# Patient Record
Sex: Female | Born: 1965
Health system: Southern US, Community
[De-identification: ages and names within clinical notes are randomized; demographics above are authoritative.]

## PROBLEM LIST (undated history)

## (undated) DIAGNOSIS — E119 Type 2 diabetes mellitus without complications: Secondary | ICD-10-CM

## (undated) DIAGNOSIS — I10 Essential (primary) hypertension: Secondary | ICD-10-CM

## (undated) DIAGNOSIS — E7800 Pure hypercholesterolemia, unspecified: Secondary | ICD-10-CM

## (undated) HISTORY — PX: HERNIA REPAIR: SHX51

---

## 1997-10-29 ENCOUNTER — Encounter: Admission: RE | Admit: 1997-10-29 | Discharge: 1997-10-29 | Payer: Self-pay | Admitting: Family Medicine

## 1997-11-05 ENCOUNTER — Encounter: Admission: RE | Admit: 1997-11-05 | Discharge: 1997-11-05 | Payer: Self-pay | Admitting: Family Medicine

## 1997-11-12 ENCOUNTER — Encounter: Admission: RE | Admit: 1997-11-12 | Discharge: 1997-11-12 | Payer: Self-pay | Admitting: Family Medicine

## 1997-11-14 ENCOUNTER — Encounter: Admission: RE | Admit: 1997-11-14 | Discharge: 1997-11-14 | Payer: Self-pay | Admitting: Family Medicine

## 1997-11-21 ENCOUNTER — Encounter: Admission: RE | Admit: 1997-11-21 | Discharge: 1997-11-21 | Payer: Self-pay | Admitting: Family Medicine

## 1997-11-27 ENCOUNTER — Encounter: Admission: RE | Admit: 1997-11-27 | Discharge: 1997-11-27 | Payer: Self-pay | Admitting: Family Medicine

## 1997-12-05 ENCOUNTER — Encounter: Admission: RE | Admit: 1997-12-05 | Discharge: 1997-12-05 | Payer: Self-pay | Admitting: Family Medicine

## 1997-12-13 ENCOUNTER — Encounter: Admission: RE | Admit: 1997-12-13 | Discharge: 1997-12-13 | Payer: Self-pay | Admitting: Family Medicine

## 1997-12-21 ENCOUNTER — Encounter: Admission: RE | Admit: 1997-12-21 | Discharge: 1997-12-21 | Payer: Self-pay | Admitting: Family Medicine

## 1997-12-27 ENCOUNTER — Encounter: Admission: RE | Admit: 1997-12-27 | Discharge: 1997-12-27 | Payer: Self-pay | Admitting: Family Medicine

## 1998-01-03 ENCOUNTER — Encounter: Admission: RE | Admit: 1998-01-03 | Discharge: 1998-01-03 | Payer: Self-pay | Admitting: Family Medicine

## 1998-01-10 ENCOUNTER — Encounter: Admission: RE | Admit: 1998-01-10 | Discharge: 1998-01-10 | Payer: Self-pay | Admitting: Family Medicine

## 1998-01-14 ENCOUNTER — Encounter: Admission: RE | Admit: 1998-01-14 | Discharge: 1998-01-14 | Payer: Self-pay | Admitting: Family Medicine

## 1998-01-16 ENCOUNTER — Encounter: Admission: RE | Admit: 1998-01-16 | Discharge: 1998-01-16 | Payer: Self-pay | Admitting: Family Medicine

## 1998-01-18 ENCOUNTER — Encounter: Admission: RE | Admit: 1998-01-18 | Discharge: 1998-01-18 | Payer: Self-pay | Admitting: Family Medicine

## 1998-01-29 ENCOUNTER — Encounter: Admission: RE | Admit: 1998-01-29 | Discharge: 1998-01-29 | Payer: Self-pay | Admitting: Sports Medicine

## 1998-02-19 ENCOUNTER — Encounter: Admission: RE | Admit: 1998-02-19 | Discharge: 1998-02-19 | Payer: Self-pay | Admitting: Family Medicine

## 1998-10-11 ENCOUNTER — Emergency Department (HOSPITAL_COMMUNITY): Admission: EM | Admit: 1998-10-11 | Discharge: 1998-10-11 | Payer: Self-pay | Admitting: Emergency Medicine

## 1998-12-17 ENCOUNTER — Encounter: Admission: RE | Admit: 1998-12-17 | Discharge: 1998-12-17 | Payer: Self-pay | Admitting: Family Medicine

## 1999-01-30 ENCOUNTER — Emergency Department (HOSPITAL_COMMUNITY): Admission: EM | Admit: 1999-01-30 | Discharge: 1999-01-30 | Payer: Self-pay | Admitting: Emergency Medicine

## 1999-02-05 ENCOUNTER — Encounter: Admission: RE | Admit: 1999-02-05 | Discharge: 1999-02-05 | Payer: Self-pay | Admitting: Family Medicine

## 1999-02-19 ENCOUNTER — Emergency Department (HOSPITAL_COMMUNITY): Admission: EM | Admit: 1999-02-19 | Discharge: 1999-02-19 | Payer: Self-pay | Admitting: Emergency Medicine

## 1999-03-20 ENCOUNTER — Encounter: Admission: RE | Admit: 1999-03-20 | Discharge: 1999-03-20 | Payer: Self-pay | Admitting: Family Medicine

## 1999-04-28 ENCOUNTER — Encounter: Admission: RE | Admit: 1999-04-28 | Discharge: 1999-04-28 | Payer: Self-pay | Admitting: Family Medicine

## 1999-04-29 ENCOUNTER — Encounter: Admission: RE | Admit: 1999-04-29 | Discharge: 1999-07-28 | Payer: Self-pay

## 1999-05-26 ENCOUNTER — Encounter: Admission: RE | Admit: 1999-05-26 | Discharge: 1999-05-26 | Payer: Self-pay | Admitting: Family Medicine

## 1999-06-18 ENCOUNTER — Ambulatory Visit (HOSPITAL_COMMUNITY): Admission: RE | Admit: 1999-06-18 | Discharge: 1999-06-18 | Payer: Self-pay

## 1999-10-10 ENCOUNTER — Encounter: Admission: RE | Admit: 1999-10-10 | Discharge: 1999-10-10 | Payer: Self-pay | Admitting: Family Medicine

## 1999-10-22 ENCOUNTER — Encounter: Admission: RE | Admit: 1999-10-22 | Discharge: 1999-10-22 | Payer: Self-pay | Admitting: Family Medicine

## 2000-05-19 ENCOUNTER — Emergency Department (HOSPITAL_COMMUNITY): Admission: EM | Admit: 2000-05-19 | Discharge: 2000-05-20 | Payer: Self-pay | Admitting: Emergency Medicine

## 2000-05-19 ENCOUNTER — Encounter: Payer: Self-pay | Admitting: Emergency Medicine

## 2000-06-03 ENCOUNTER — Encounter: Admission: RE | Admit: 2000-06-03 | Discharge: 2000-06-03 | Payer: Self-pay | Admitting: Family Medicine

## 2000-07-15 ENCOUNTER — Encounter: Payer: Self-pay | Admitting: General Surgery

## 2000-07-16 ENCOUNTER — Observation Stay (HOSPITAL_COMMUNITY): Admission: RE | Admit: 2000-07-16 | Discharge: 2000-07-17 | Payer: Self-pay | Admitting: General Surgery

## 2000-11-21 ENCOUNTER — Encounter: Payer: Self-pay | Admitting: Internal Medicine

## 2000-11-21 ENCOUNTER — Emergency Department (HOSPITAL_COMMUNITY): Admission: EM | Admit: 2000-11-21 | Discharge: 2000-11-22 | Payer: Self-pay | Admitting: Emergency Medicine

## 2001-03-02 ENCOUNTER — Emergency Department (HOSPITAL_COMMUNITY): Admission: EM | Admit: 2001-03-02 | Discharge: 2001-03-02 | Payer: Self-pay | Admitting: *Deleted

## 2001-03-14 ENCOUNTER — Encounter: Admission: RE | Admit: 2001-03-14 | Discharge: 2001-03-14 | Payer: Self-pay | Admitting: Sports Medicine

## 2001-04-19 ENCOUNTER — Encounter: Admission: RE | Admit: 2001-04-19 | Discharge: 2001-04-19 | Payer: Self-pay | Admitting: Family Medicine

## 2002-07-17 ENCOUNTER — Encounter (INDEPENDENT_AMBULATORY_CARE_PROVIDER_SITE_OTHER): Payer: Self-pay | Admitting: *Deleted

## 2002-07-17 LAB — CONVERTED CEMR LAB

## 2002-08-02 ENCOUNTER — Encounter: Admission: RE | Admit: 2002-08-02 | Discharge: 2002-08-02 | Payer: Self-pay | Admitting: Family Medicine

## 2002-08-16 ENCOUNTER — Encounter: Payer: Self-pay | Admitting: Family Medicine

## 2002-08-16 ENCOUNTER — Ambulatory Visit (HOSPITAL_COMMUNITY): Admission: RE | Admit: 2002-08-16 | Discharge: 2002-08-16 | Payer: Self-pay | Admitting: *Deleted

## 2002-11-16 ENCOUNTER — Encounter: Admission: RE | Admit: 2002-11-16 | Discharge: 2002-11-16 | Payer: Self-pay | Admitting: Family Medicine

## 2003-03-09 ENCOUNTER — Encounter (INDEPENDENT_AMBULATORY_CARE_PROVIDER_SITE_OTHER): Payer: Self-pay | Admitting: *Deleted

## 2003-03-09 ENCOUNTER — Encounter: Admission: RE | Admit: 2003-03-09 | Discharge: 2003-03-09 | Payer: Self-pay | Admitting: Sports Medicine

## 2003-04-10 ENCOUNTER — Encounter: Admission: RE | Admit: 2003-04-10 | Discharge: 2003-04-10 | Payer: Self-pay | Admitting: Family Medicine

## 2003-11-25 ENCOUNTER — Emergency Department (HOSPITAL_COMMUNITY): Admission: EM | Admit: 2003-11-25 | Discharge: 2003-11-25 | Payer: Self-pay | Admitting: Family Medicine

## 2003-11-26 ENCOUNTER — Emergency Department (HOSPITAL_COMMUNITY): Admission: EM | Admit: 2003-11-26 | Discharge: 2003-11-26 | Payer: Self-pay | Admitting: Family Medicine

## 2003-12-09 ENCOUNTER — Emergency Department (HOSPITAL_COMMUNITY): Admission: EM | Admit: 2003-12-09 | Discharge: 2003-12-09 | Payer: Self-pay | Admitting: *Deleted

## 2006-07-15 DIAGNOSIS — E663 Overweight: Secondary | ICD-10-CM

## 2006-07-15 DIAGNOSIS — F1010 Alcohol abuse, uncomplicated: Secondary | ICD-10-CM

## 2006-07-15 DIAGNOSIS — I10 Essential (primary) hypertension: Secondary | ICD-10-CM | POA: Insufficient documentation

## 2006-07-16 ENCOUNTER — Encounter (INDEPENDENT_AMBULATORY_CARE_PROVIDER_SITE_OTHER): Payer: Self-pay | Admitting: *Deleted

## 2007-02-21 ENCOUNTER — Encounter: Payer: Self-pay | Admitting: Family Medicine

## 2007-02-21 ENCOUNTER — Ambulatory Visit: Payer: Self-pay | Admitting: Sports Medicine

## 2007-02-21 ENCOUNTER — Ambulatory Visit (HOSPITAL_COMMUNITY): Admission: RE | Admit: 2007-02-21 | Discharge: 2007-02-21 | Payer: Self-pay | Admitting: Family Medicine

## 2007-02-21 LAB — CONVERTED CEMR LAB
ALT: 15 units/L (ref 0–35)
AST: 17 units/L (ref 0–37)
Albumin: 4.5 g/dL (ref 3.5–5.2)
BUN: 11 mg/dL (ref 6–23)
CO2: 24 meq/L (ref 19–32)
Chloride: 104 meq/L (ref 96–112)
Creatinine, Ser: 0.73 mg/dL (ref 0.40–1.20)
Glucose, Bld: 106 mg/dL — ABNORMAL HIGH (ref 70–99)
LDL Cholesterol: 107 mg/dL — ABNORMAL HIGH (ref 0–99)
MCHC: 32.6 g/dL (ref 30.0–36.0)
MCV: 89.3 fL (ref 78.0–100.0)
RBC: 4.19 M/uL (ref 3.87–5.11)
TSH: 2.167 microintl units/mL (ref 0.350–5.50)
Total Protein: 8.1 g/dL (ref 6.0–8.3)
Triglycerides: 130 mg/dL (ref <150)
WBC: 5 10*3/uL (ref 4.0–10.5)

## 2007-02-23 ENCOUNTER — Encounter: Payer: Self-pay | Admitting: Family Medicine

## 2007-03-04 ENCOUNTER — Encounter: Payer: Self-pay | Admitting: Family Medicine

## 2007-03-04 ENCOUNTER — Ambulatory Visit: Payer: Self-pay | Admitting: Internal Medicine

## 2007-03-08 ENCOUNTER — Ambulatory Visit (HOSPITAL_COMMUNITY): Admission: RE | Admit: 2007-03-08 | Discharge: 2007-03-08 | Payer: Self-pay | Admitting: Sports Medicine

## 2007-03-08 ENCOUNTER — Ambulatory Visit (HOSPITAL_COMMUNITY): Admission: RE | Admit: 2007-03-08 | Discharge: 2007-03-08 | Payer: Self-pay | Admitting: *Deleted

## 2007-03-08 ENCOUNTER — Encounter: Payer: Self-pay | Admitting: Sports Medicine

## 2007-03-11 ENCOUNTER — Encounter: Payer: Self-pay | Admitting: Family Medicine

## 2007-03-11 ENCOUNTER — Ambulatory Visit: Payer: Self-pay | Admitting: Family Medicine

## 2007-03-11 LAB — CONVERTED CEMR LAB
BUN: 18 mg/dL (ref 6–23)
CO2: 20 meq/L (ref 19–32)
Calcium: 9.3 mg/dL (ref 8.4–10.5)
Chloride: 98 meq/L (ref 96–112)

## 2007-03-15 ENCOUNTER — Ambulatory Visit: Payer: Self-pay | Admitting: Family Medicine

## 2008-03-19 LAB — CONVERTED CEMR LAB
Cholesterol: 191 mg/dL
Glucose, Bld: 141 mg/dL
HDL: 61 mg/dL
Triglycerides: 82 mg/dL

## 2008-03-23 ENCOUNTER — Ambulatory Visit (HOSPITAL_COMMUNITY): Admission: RE | Admit: 2008-03-23 | Discharge: 2008-03-23 | Payer: Self-pay | Admitting: *Deleted

## 2008-03-26 ENCOUNTER — Telehealth: Payer: Self-pay | Admitting: Family Medicine

## 2008-03-29 ENCOUNTER — Encounter: Payer: Self-pay | Admitting: *Deleted

## 2008-04-18 ENCOUNTER — Ambulatory Visit: Payer: Self-pay | Admitting: Family Medicine

## 2008-04-18 ENCOUNTER — Encounter: Payer: Self-pay | Admitting: Family Medicine

## 2008-04-18 LAB — CONVERTED CEMR LAB
BUN: 13 mg/dL (ref 6–23)
CO2: 25 meq/L (ref 19–32)
Potassium: 4.7 meq/L (ref 3.5–5.3)

## 2008-05-14 ENCOUNTER — Ambulatory Visit: Payer: Self-pay | Admitting: Family Medicine

## 2008-05-14 ENCOUNTER — Encounter: Payer: Self-pay | Admitting: Family Medicine

## 2008-05-14 DIAGNOSIS — E119 Type 2 diabetes mellitus without complications: Secondary | ICD-10-CM | POA: Insufficient documentation

## 2008-05-14 DIAGNOSIS — E1165 Type 2 diabetes mellitus with hyperglycemia: Secondary | ICD-10-CM

## 2008-05-14 LAB — CONVERTED CEMR LAB
Direct LDL: 131 mg/dL — ABNORMAL HIGH
Hgb A1c MFr Bld: 8 %
TSH: 2.06 microintl units/mL (ref 0.350–4.50)

## 2008-05-15 ENCOUNTER — Encounter: Payer: Self-pay | Admitting: Family Medicine

## 2008-05-16 ENCOUNTER — Encounter: Payer: Self-pay | Admitting: Family Medicine

## 2008-05-23 ENCOUNTER — Ambulatory Visit: Payer: Self-pay | Admitting: Cardiovascular Disease

## 2008-05-23 ENCOUNTER — Encounter: Payer: Self-pay | Admitting: Family Medicine

## 2008-05-23 ENCOUNTER — Ambulatory Visit (HOSPITAL_COMMUNITY): Admission: RE | Admit: 2008-05-23 | Discharge: 2008-05-23 | Payer: Self-pay | Admitting: Family Medicine

## 2008-05-29 ENCOUNTER — Telehealth (INDEPENDENT_AMBULATORY_CARE_PROVIDER_SITE_OTHER): Payer: Self-pay | Admitting: *Deleted

## 2008-07-04 ENCOUNTER — Telehealth: Payer: Self-pay | Admitting: Family Medicine

## 2008-07-23 ENCOUNTER — Encounter: Payer: Self-pay | Admitting: Family Medicine

## 2008-07-23 ENCOUNTER — Ambulatory Visit: Payer: Self-pay | Admitting: Family Medicine

## 2008-07-23 DIAGNOSIS — E785 Hyperlipidemia, unspecified: Secondary | ICD-10-CM | POA: Insufficient documentation

## 2008-07-23 LAB — CONVERTED CEMR LAB
BUN: 14 mg/dL (ref 6–23)
CO2: 20 meq/L (ref 19–32)
Calcium: 9.8 mg/dL (ref 8.4–10.5)
Potassium: 4.6 meq/L (ref 3.5–5.3)
Sodium: 137 meq/L (ref 135–145)

## 2008-07-23 IMAGING — MG MM DIGITAL SCREENING BILAT
3 series · 3 of 3 positions shown · non-contrast
Comparison: Prior studies.

DG SCREEN MAMMOGRAM BILATERAL
Bilateral CC and MLO view(s) were taken.
Prior study comparison: August 16, 2002, bilateral screening mammogram.

DIGITAL SCREENING MAMMOGRAM WITH CAD:

[R CC]
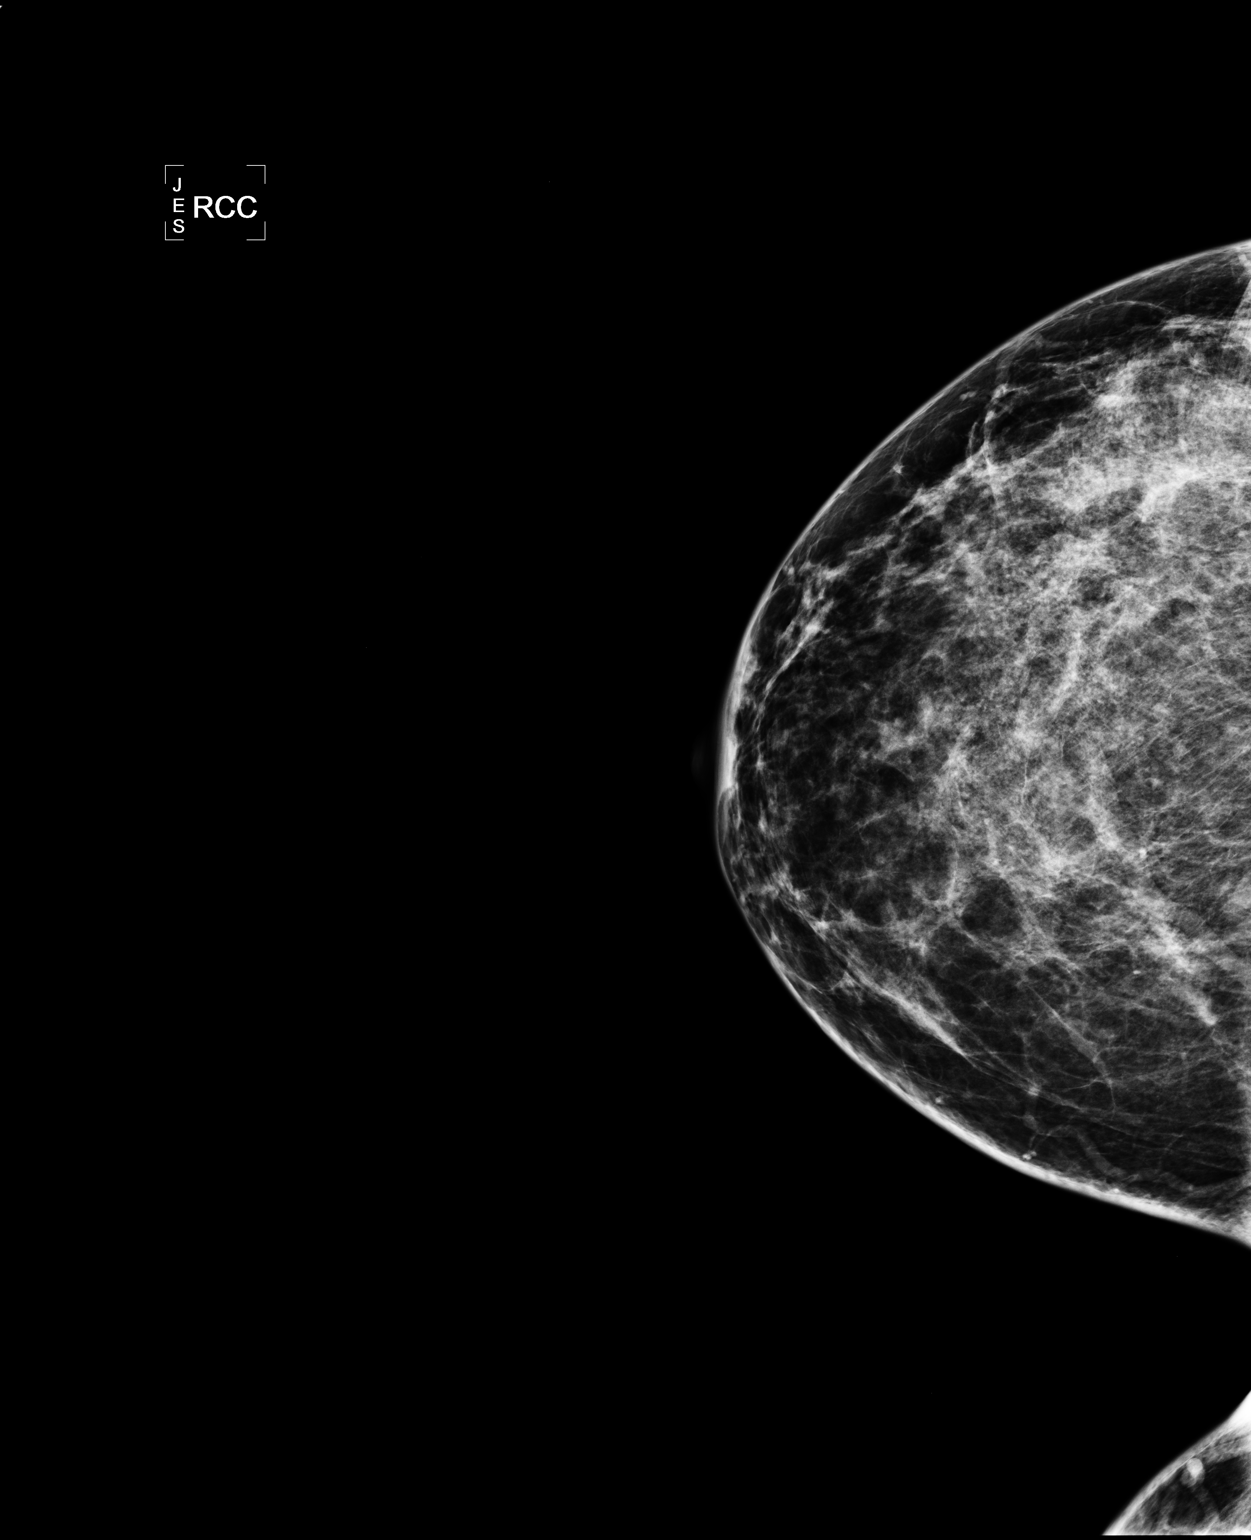

[R MLO]
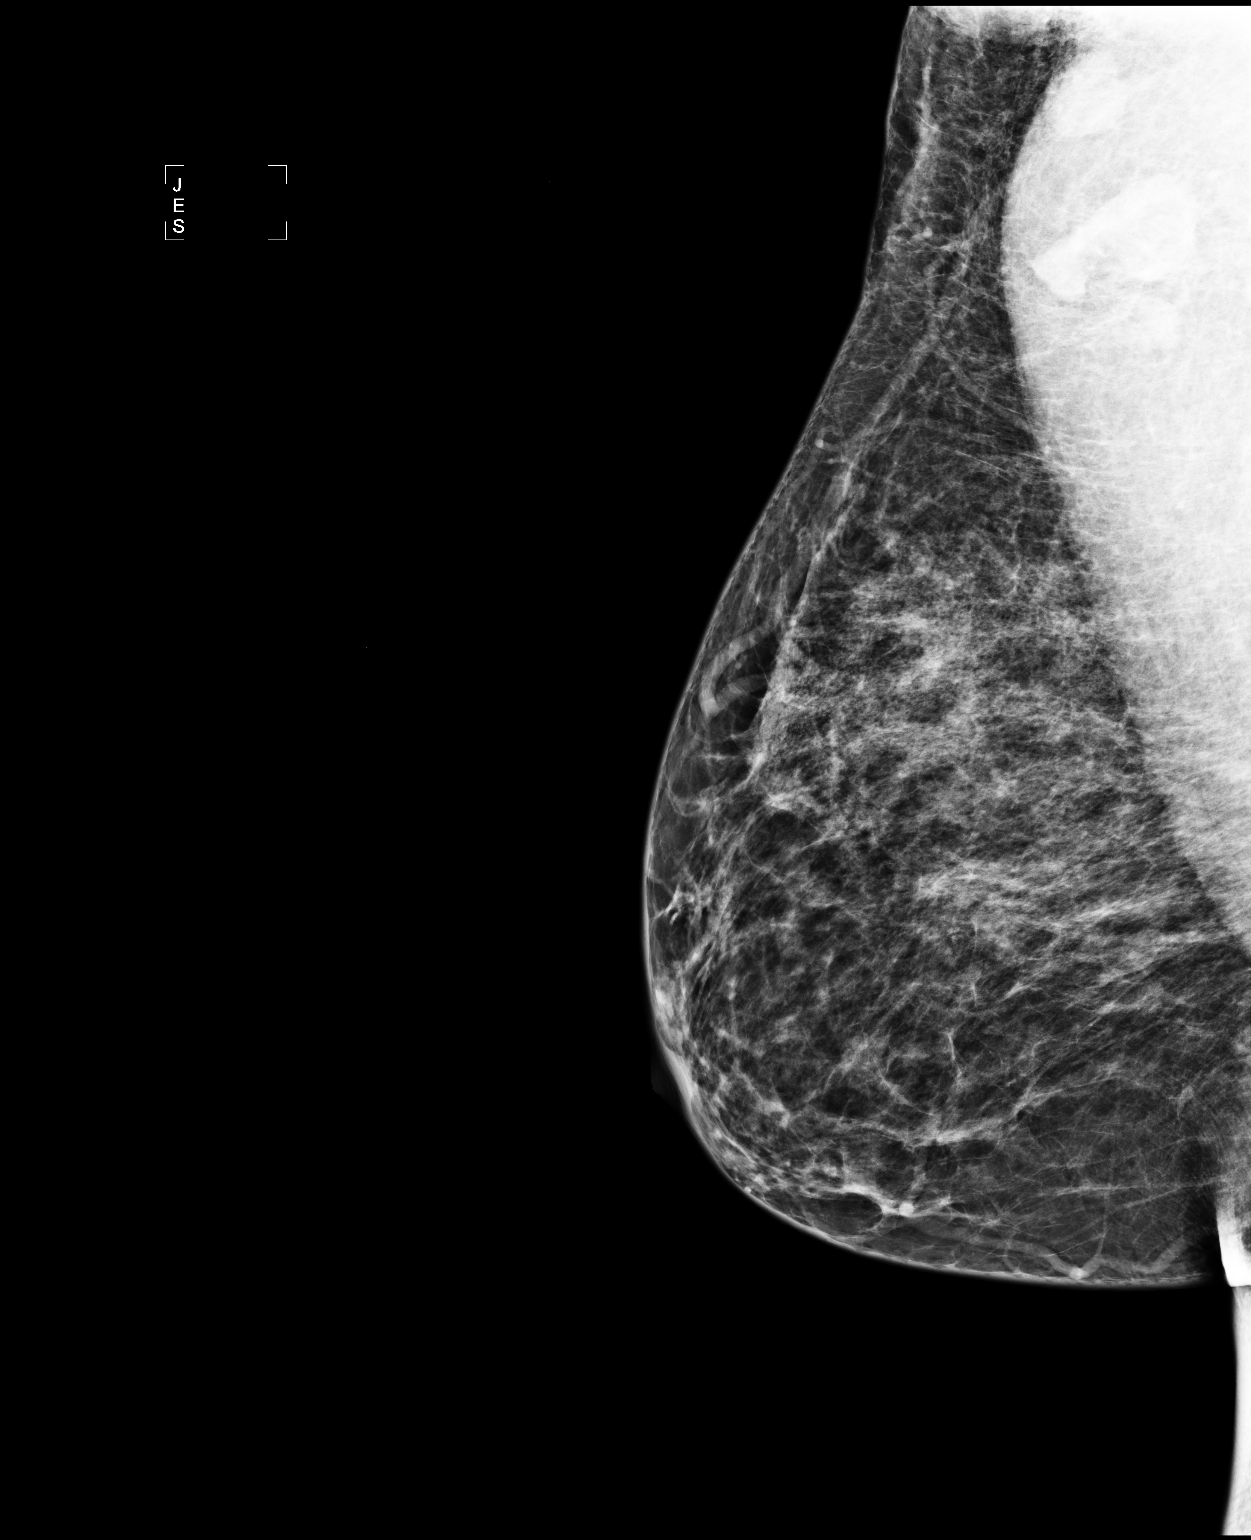

[L MLO]
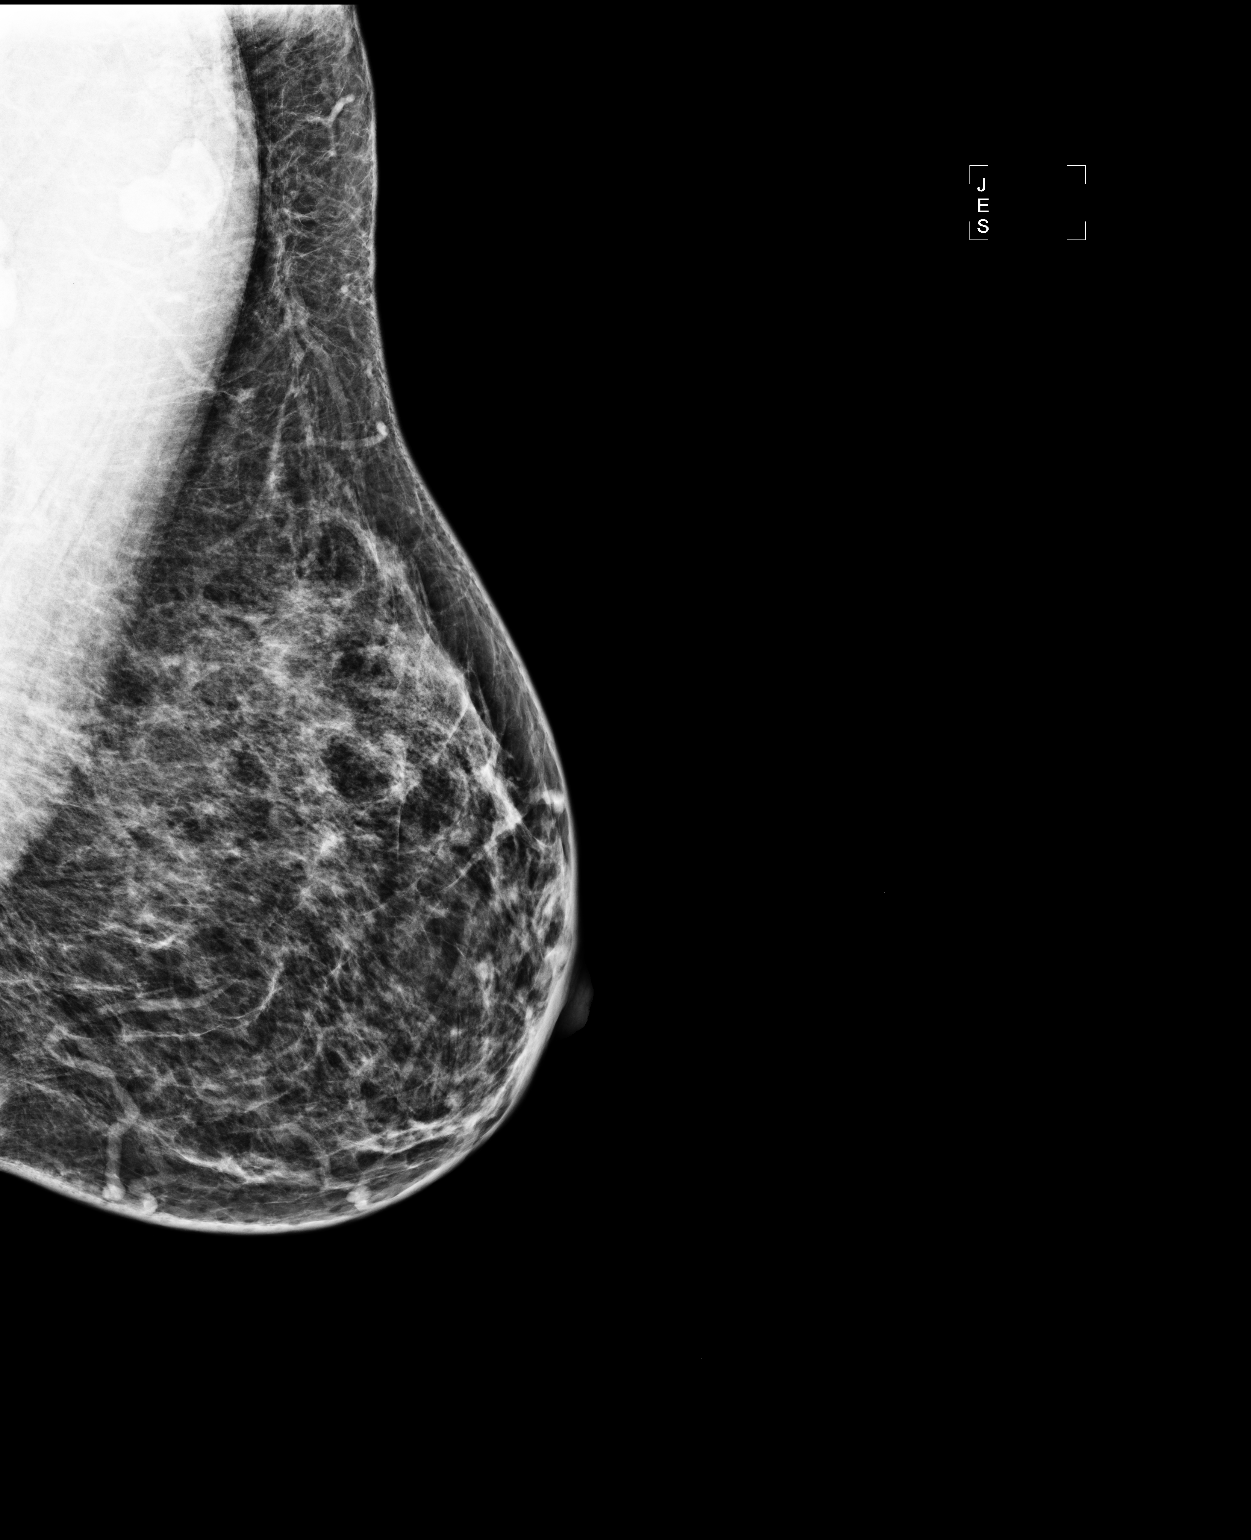

[3 of 3 positions shown; findings below may reference images not displayed]

The breast tissue is heterogeneously dense.  There is no dominant mass, architectural distortion or
calcification to suggest malignancy.
IMPRESSION: No mammographic evidence of malignancy.  Suggest yearly screening mammography.

ASSESSMENT: Negative - BI-RADS 1

Screening mammogram in 1 year.
ANALYZED BY COMPUTER AIDED DETECTION. , THIS PROCEDURE WAS A DIGITAL MAMMOGRAM.

## 2008-08-27 ENCOUNTER — Ambulatory Visit: Payer: Self-pay | Admitting: Family Medicine

## 2008-08-27 LAB — CONVERTED CEMR LAB: Hgb A1c MFr Bld: 8 %

## 2008-10-09 ENCOUNTER — Encounter: Payer: Self-pay | Admitting: Family Medicine

## 2008-10-09 ENCOUNTER — Ambulatory Visit: Payer: Self-pay | Admitting: Family Medicine

## 2008-10-12 LAB — CONVERTED CEMR LAB
Calcium: 10.1 mg/dL (ref 8.4–10.5)
Sodium: 140 meq/L (ref 135–145)

## 2009-01-17 ENCOUNTER — Ambulatory Visit: Payer: Self-pay | Admitting: Family Medicine

## 2009-02-23 ENCOUNTER — Encounter (INDEPENDENT_AMBULATORY_CARE_PROVIDER_SITE_OTHER): Payer: Self-pay | Admitting: *Deleted

## 2009-02-23 DIAGNOSIS — F17200 Nicotine dependence, unspecified, uncomplicated: Secondary | ICD-10-CM | POA: Insufficient documentation

## 2009-03-25 ENCOUNTER — Ambulatory Visit (HOSPITAL_COMMUNITY): Admission: RE | Admit: 2009-03-25 | Discharge: 2009-03-25 | Payer: Self-pay | Admitting: *Deleted

## 2009-09-19 ENCOUNTER — Encounter: Payer: Self-pay | Admitting: Family Medicine

## 2009-09-19 ENCOUNTER — Ambulatory Visit: Payer: Self-pay | Admitting: Family Medicine

## 2009-09-19 LAB — CONVERTED CEMR LAB
Chlamydia, DNA Probe: NEGATIVE
GC Probe Amp, Genital: NEGATIVE
Hgb A1c MFr Bld: 6.9 %

## 2009-09-23 ENCOUNTER — Encounter: Payer: Self-pay | Admitting: Family Medicine

## 2009-09-23 LAB — CONVERTED CEMR LAB: Pap Smear: NEGATIVE

## 2009-10-24 ENCOUNTER — Encounter: Payer: Self-pay | Admitting: Family Medicine

## 2009-10-25 ENCOUNTER — Telehealth: Payer: Self-pay | Admitting: Family Medicine

## 2009-10-30 ENCOUNTER — Ambulatory Visit: Payer: Self-pay | Admitting: Family Medicine

## 2009-10-30 DIAGNOSIS — A599 Trichomoniasis, unspecified: Secondary | ICD-10-CM | POA: Insufficient documentation

## 2009-11-01 ENCOUNTER — Telehealth (INDEPENDENT_AMBULATORY_CARE_PROVIDER_SITE_OTHER): Payer: Self-pay | Admitting: *Deleted

## 2010-02-21 ENCOUNTER — Encounter: Payer: Self-pay | Admitting: Family Medicine

## 2010-04-21 ENCOUNTER — Ambulatory Visit: Payer: Self-pay | Admitting: Family Medicine

## 2010-04-21 DIAGNOSIS — M79609 Pain in unspecified limb: Secondary | ICD-10-CM

## 2010-04-21 LAB — CONVERTED CEMR LAB: Hgb A1c MFr Bld: 7.8 %

## 2010-04-24 ENCOUNTER — Ambulatory Visit (HOSPITAL_COMMUNITY)
Admission: RE | Admit: 2010-04-24 | Discharge: 2010-04-24 | Payer: Self-pay | Source: Home / Self Care | Attending: Family Medicine | Admitting: Family Medicine

## 2010-04-30 ENCOUNTER — Ambulatory Visit (HOSPITAL_COMMUNITY)
Admission: RE | Admit: 2010-04-30 | Discharge: 2010-04-30 | Payer: Self-pay | Source: Home / Self Care | Attending: Family Medicine | Admitting: Family Medicine

## 2010-06-17 NOTE — Progress Notes (Signed)
  Phone Note Outgoing Call   Call placed by: Lamar Laundry, MD June 10th, 2011 Call placed to: Patient Summary of Call: Unfortuantely patient's listed home phone no longer in service.  Letter was returned as not forwardable.  She does work 3rd shift at [employer] on the [employer].  I called her work number.  She does not ever actually come into the office as she is 3rd shift but the woman I spoke with said she would give a note to her manager asking Sally Mclaughlin to call us at her earliest convenience.  When she calls in, please forward to RN.  Patient had trichomonas on her pap (she has had this before) and needs to be treated.  Her pap smear was normal otherwise, by the way.   Initial call taken by: Lamar Laundry, MD June 10th, 2011

## 2010-06-17 NOTE — Letter (Signed)
Summary: Generic Letter  [facility]     [place], [place]    Phone:   Fax:     02/21/2010  Sally Mclaughlin [address] [place], [place]  [address]  Dear Sally Mclaughlin,   I am writing to inform you that it is time for you to come in to have your blood pressure rechecked as well as have some labs drawn. Some of these labs will require that you are FASTING (nothing to eat or drink before coming in to have the blood drawn). Please call the office and schedule an appointment with me, Dr. Fara Boros, your new primary doctor. You can either come in fasting and we can draw the labs at that time, or you can plan to come back after our appointment for a lab-only visit.  I look forward to meeting you!      Sincerely,   Demetria Pore MD  Appended Document: Generic Letter mailed.

## 2010-06-17 NOTE — Progress Notes (Signed)
Summary: phn msg  Phone Note Call from Patient Call back at [phone]   Caller: Patient Summary of Call: pt is wanting to talk to nurse  Initial call taken by: De Nurse,  November 01, 2009 9:03 AM  Follow-up for Phone Call        patient is concerned because she has told husband that he needs to be checked for trich and he is refusing . advised patien tthat is is important that he be treated . Follow-up by: Theresia Lo RN,  November 01, 2009 10:04 AM     Appended Document: phn msg Her husband is my patient too.  Now that I know she has told him, I will try to call him and encourage treatment.  He does have appt 6/28.  His name is Sally Mclaughlin.

## 2010-06-17 NOTE — Letter (Signed)
Summary: Generic Letter  [facility]  [address]   [place], [place] [address]   Phone: [phone]  Fax: [phone]    09/23/2009  Sally Mclaughlin [address] [place], [place]  [address]  Dear Ms. Reali,  I was unable to reach you by phone.  I am happy to inform you that your pap smear was normal.  Your gonorrhea and chlamydia tests were negative.  However, you do have trichomonas.  This is a sexually transmitted disease and you have had it in the past.  It is easily treated by taking 1 large dose of a medicine called flagyl, or metronidazole, one time.  I have included the prescription with this letter.  Please fill it and take it today.  Any sexual partners should be informed and they should contact their doctor to be treated as well.  The medicine interacts with alcohol, so please abstain from alcohol the day that you take the medicine.  IF you have any questions, please call our office.     Sincerely,   Ardeen Garland  MD  Appended Document: Generic Letter Letter mailed along with script to pt.  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 I  have called them and asked them to leave a note for her asking her to give [place] a call.  See phone note for more details.

## 2010-06-17 NOTE — Miscellaneous (Signed)
Summary: PATIENT SUMMARY  Clinical Lists Changes  Problems: Removed problem of SCREENING FOR MALIGNANT NEOPLASM OF THE CERVIX (ICD-V76.2) Removed problem of AFTERCARE, LONG-TERM USE, MEDICATIONS NEC (ICD-V58.69) Assessed DIABETES MELLITUS, TYPE II, UNCONTROLLED as comment only - Patient often not taking prescriptions as prescribed, takes infrequently, or runs out for long periods of time.  Difficulty following diet/increasing exercise. Has difficulty affording medicines so try to stick to discount list.  Hopeful will not end up needing insulin as likely would not do well with it.  Her updated medication list for this problem includes:    Lisinopril-hydrochlorothiazide 20-12.5 Mg Tabs (Lisinopril-hydrochlorothiazide) .Marland Kitchen... 2 tabs by mouth once a day after you wake up/before you go to work for your blood pressure.    Metformin Hcl 1000 Mg Tabs (Metformin hcl) .Marland Kitchen... 1 tab twice a day for diabetes  Assessed HYPERTENSION, BENIGN SYSTEMIC as comment only - difficult to adjust meds as often has not taken them when she arrives for appointments.  Her updated medication list for this problem includes:    Metoprolol Tartrate 50 Mg Tabs (Metoprolol tartrate) .Marland Kitchen... 1 tab by mouth two times a day for blood pressure.  take it before you go to work and before you go to bed.    Lisinopril-hydrochlorothiazide 20-12.5 Mg Tabs (Lisinopril-hydrochlorothiazide) .Marland Kitchen... 2 tabs by mouth once a day after you wake up/before you go to work for your blood pressure.  Medications: Removed medication of METRONIDAZOLE 500 MG TABS (METRONIDAZOLE) 4 tabs by mouth x 1 today Observations: Added new observation of SOCIAL HX: Lives in [place] with 2 children and grandchild, smokes 1 cigs/d, drinks 4-12 beers/d quit 05/19/99,married 5/09 Dustin Folks (pt here) but separated as of spring 2011, sexually active, BTL/no condoms, works at [employer] on the night shift.  High salt diet.  Has difficulty understanding diabetic diet, how to  take meds.  (10/24/2009 15:38)       Social History: Lives in [place] with 2 children and grandchild, smokes 1 cigs/d, drinks 4-12 beers/d quit 05/19/99,married 5/09 Dustin Folks (pt here) but separated as of spring 2011, sexually active, BTL/no condoms, works at [employer] on the night shift.  High salt diet.  Has difficulty understanding diabetic diet, how to take meds.    Impression & Recommendations:  Problem # 1:  DIABETES MELLITUS, TYPE II, UNCONTROLLED (ICD-250.02) Patient often not taking prescriptions as prescribed, takes infrequently, or runs out for long periods of time.  Difficulty following diet/increasing exercise. Has difficulty affording medicines so try to stick to discount list.  Hopeful will not end up needing insulin as likely would not do well with it.  Her updated medication list for this problem includes:    Lisinopril-hydrochlorothiazide 20-12.5 Mg Tabs (Lisinopril-hydrochlorothiazide) .Marland Kitchen... 2 tabs by mouth once a day after you wake up/before you go to work for your blood pressure.    Metformin Hcl 1000 Mg Tabs (Metformin hcl) .Marland Kitchen... 1 tab twice a day for diabetes  Problem # 2:  HYPERTENSION, BENIGN SYSTEMIC (ICD-401.1) difficult to adjust meds as often has not taken them when she arrives for appointments.  Her updated medication list for this problem includes:    Metoprolol Tartrate 50 Mg Tabs (Metoprolol tartrate) .Marland Kitchen... 1 tab by mouth two times a day for blood pressure.  take it before you go to work and before you go to bed.    Lisinopril-hydrochlorothiazide 20-12.5 Mg Tabs (Lisinopril-hydrochlorothiazide) .Marland Kitchen... 2 tabs by mouth once a day after you wake up/before you go to work for your blood pressure.  Problem # 3:  Preventive Health Care (ICD-V70.0) Last mammo 2009.  Has been referred for/instructed to schedule one since but has not.  Last pap in 2011.  Normal x for trichomonas.   Complete Medication List: 1)  Metoprolol Tartrate 50 Mg Tabs (Metoprolol  tartrate) .Marland Kitchen.. 1 tab by mouth two times a day for blood pressure.  take it before you go to work and before you go to bed. 2)  Lisinopril-hydrochlorothiazide 20-12.5 Mg Tabs (Lisinopril-hydrochlorothiazide) .... 2 tabs by mouth once a day after you wake up/before you go to work for your blood pressure. 3)  Metformin Hcl 1000 Mg Tabs (Metformin hcl) .Marland Kitchen.. 1 tab twice a day for diabetes 4)  Pravastatin Sodium 40 Mg Tabs (Pravastatin sodium) .Marland Kitchen.. 1 tab by mouth everyday before you go to sleep for your cholesterol.

## 2010-06-17 NOTE — Assessment & Plan Note (Signed)
Summary: f/up,tcb   Vital Signs:  Patient profile:   45 year old female Height:      66 inches Weight:      197.44 pounds BMI:     31.98 BSA:     1.99 Temp:     98.7 degrees F Pulse rate:   66 / minute BP sitting:   151 / 87  Vitals Entered By: Jone Baseman CMA (April 21, 2010 8:36 AM) CC: f/u, Hypertension Management Is Patient Diabetic? Yes Did you bring your meter with you today? No Pain Assessment Patient in pain? no        Primary Provider:  Demetria Pore MD  CC:  f/u and Hypertension Management.  History of Present Illness: Pt p/w cheif complaint of upper leg pain.  Has been happening for "a long time," "years." Some times her baby aspirin helps, other times she needs to take Tylenol/Advil. She has a group of varicose veins that is where the pain is.  Her upper thigh is achy, knee and hip feel ok.  No sharp pains, but sometimes her leg gets "tired" and causes her to fall.  Her last fall was  ~2 weeks ago.  She has never gotten hurt when falling b/c she "braces" herself.  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   Pt also needs her metoprolol refilled-- she has been out of it since last night.  Still has lisinopril/HCTZ and is taking ASA 81mg .  Does not check her BP at home.  No CP, HAs, SOB, dizziness. Complains of some BLE edema after work; goes away w/ putting her feet up and resting.  Does not check sugars at home; doesn't know how and doesn't want to.  Takes her metformin every day, but knows she is not eating the way she should. A1c today = 7.8; was 6.9 09/19/09  Hypertension History:      She denies headache, chest pain, palpitations, dyspnea with exertion, visual symptoms, neurologic problems, and side effects from treatment.  She notes no problems with any antihypertensive medication side effects.        Positive major cardiovascular risk factors include diabetes, hyperlipidemia, hypertension, and current tobacco  user.  Negative major cardiovascular risk factors include female age less than 61 years old.      Habits & Providers  Alcohol-Tobacco-Diet     Tobacco Status: current     Tobacco Counseling: to quit use of tobacco products     Cigarette Packs/Day: <0.25  Current Problems (verified): 1)  Trichomonal Infection  (ICD-131.9) 2)  Diabetes Mellitus, Type II, Uncontrolled  (ICD-250.02) 3)  Hypertension, Benign Systemic  (ICD-401.1) 4)  Hyperlipidemia  (ICD-272.4) 5)  Obesity, Nos  (ICD-278.00) 6)  Tobacco User  (ICD-305.1) 7)  Alcohol Abuse, Unspecified  (ICD-305.00)  Current Medications (verified): 1)  Metoprolol Tartrate 50 Mg  Tabs (Metoprolol Tartrate) .Marland Kitchen.. 1 Tab By Mouth Two Times A Day For Blood Pressure.  Take It Before You Go To Work and Before You Go To Bed. 2)  Lisinopril-Hydrochlorothiazide 20-12.5 Mg Tabs (Lisinopril-Hydrochlorothiazide) .... 2 Tabs By Mouth Once A Day After You Wake Up/before You Go To Work For Your Blood Pressure. 3)  Metformin Hcl 1000 Mg Tabs (Metformin Hcl) .Marland Kitchen.. 1 Tab Twice A Day For Diabetes 4)  Pravastatin Sodium 40 Mg Tabs (Pravastatin Sodium) .Marland Kitchen.. 1 Tab By Mouth Everyday Before You Go To Sleep For Your Cholesterol. 5)  Metronidazole 500 Mg Tabs (Metronidazole) .Marland KitchenMarland KitchenMarland Kitchen  Take Four Tabs By Mouth X 1  Allergies (verified): No Known Drug Allergies  Social History: Lives in [place] with 2 children and grandchild, smokes 1-2 cigs/d, drinks 4-12 beers/d quit 05/19/99,married 5/09 Dustin Folks (pt here) but separated as of spring 2011, sexually active, BTL/no condoms, works at [employer] on the night shift.  High salt diet.  Has difficulty understanding diabetic diet, how to take meds.   Physical Exam  General:  alert, well-developed, well-nourished, and well-hydrated.   Head:  normocephalic and atraumatic.   Eyes:  vision grossly intact, pupils equal, pupils round, pupils reactive to light, pupils react to accomodation, and no optic disk abnormalities.     Mouth:  pharynx pink and moist, no erythema, and fair dentition.   Lungs:  normal respiratory effort, normal breath sounds, no dullness, no crackles, and no wheezes.   Heart:  normal rate, regular rhythm, and no murmur.   Abdomen:  soft, non-tender, and normal bowel sounds.   Pulses:  2+ pedal pulses Extremities:  mild edema BLE, no sores/lesions on feet bilaterally, nml sensation Group of varicose/spider veins over outter upper right leg; no TTP at hip or knee joints, nml ROM, no TTP over femur or area of pt reported discomfort Neurologic:  alert & oriented X3.    Diabetes Management Exam:    Foot Exam (with socks and/or shoes not present):       Sensory-Pinprick/Light touch:          Left medial foot (L-4): normal          Left dorsal foot (L-5): normal          Left lateral foot (S-1): normal          Right medial foot (L-4): normal          Right dorsal foot (L-5): normal          Right lateral foot (S-1): normal       Inspection:          Left foot: normal          Right foot: normal       Nails:          Left foot: normal          Right foot: normal   Impression & Recommendations:  Problem # 1:  LEG PAIN, RIGHT (ICD-729.5) Unclear etiology at this time.  Pt denies hip/knee discomfort.  Will  get Xray of thigh to r/o osteosarcoma or other bone cancer, although so other s/s (denies fevers, wt loss, increased fatigue).  Started her on scheduled Tylenol 500mg  by mouth q6hr with Mobic 15mg  daily as needed breakthrough pain.  Will have her come back to see me in 2-4 weeks to recheck pain and f/u xray resuls.   Orders: Radiology other (Radiology Other) [facility]- Est  Level 4 ([address])  Problem # 2:  HYPERTENSION, BENIGN SYSTEMIC (ICD-401.1)  Better than last visit.  Pt has run out of metoprolol, will refill.  States she does not need lis/HCTZ.  Will come back for labs (BMET, urine microalbumin). No med changes at this time.  Her updated medication list for this problem includes:     Metoprolol Tartrate 50 Mg Tabs (Metoprolol tartrate) .Marland Kitchen... 1 tab by mouth two times a day for blood pressure.  take it before you go to work and before you go to bed.    Lisinopril-hydrochlorothiazide 20-12.5 Mg Tabs (Lisinopril-hydrochlorothiazide) .Marland Kitchen... 2 tabs by mouth once a day after you wake up/before you  go to work for your blood pressure.  Orders: [facility]- Est  Level 4 ([address]214)Future Orders: Comp Met-[facility] ([id number]) ... 06/17/2010 UA Microalbumin-[facility] (03474) ... 06/17/2010  Problem # 3:  DIABETES MELLITUS, TYPE II, UNCONTROLLED (ICD-250.02) Assessment: Deteriorated A1c has gotten worse from last visit (6.9-->7.8). Not checking CBGs at home and states tha she won't.  Is not following diabetic diet, although at this point a nutrition referral seems unlikely to help pt since she does not seem to be ready to take responsibility for this change.  Will check LFTs at her lab visit b/c of metormin.   Pt has already gotten flu, denies pneumovax at this time.  Will discuss more at next visit.  Her updated medication list for this problem includes:    Lisinopril-hydrochlorothiazide 20-12.5 Mg Tabs (Lisinopril-hydrochlorothiazide) .Marland Kitchen... 2 tabs by mouth once a day after you wake up/before you go to work for your blood pressure.    Metformin Hcl 1000 Mg Tabs (Metformin hcl) .Marland Kitchen... 1 tab twice a day for diabetes  Orders: A1C-[facility] (25956) [facility]- Est  Level 4 ([address])Future Orders: Comp Met-[facility] ([id number]) ... 06/17/2010 UA Microalbumin-[facility] (51884) ... 06/17/2010 Hepatic-[facility] [id number]) ... 06/17/2010  Problem # 4:  Preventive Health Care (ICD-V70.0) Pt will come for FLP lab draw before next appt. Pt given information on mammogram (last one in 2009). Will discuss pneumovax at next visit.  Complete Medication List: 1)  Metoprolol Tartrate 50 Mg Tabs (Metoprolol tartrate) .Marland Kitchen.. 1 tab by mouth two times a day for blood pressure.  take it before you go to work and before you go to bed. 2)   Lisinopril-hydrochlorothiazide 20-12.5 Mg Tabs (Lisinopril-hydrochlorothiazide) .... 2 tabs by mouth once a day after you wake up/before you go to work for your blood pressure. 3)  Metformin Hcl 1000 Mg Tabs (Metformin hcl) .Marland Kitchen.. 1 tab twice a day for diabetes 4)  Pravastatin Sodium 40 Mg Tabs (Pravastatin sodium) .Marland Kitchen.. 1 tab by mouth everyday before you go to sleep for your cholesterol. 5)  Metronidazole 500 Mg Tabs (Metronidazole) .... Take four tabs by mouth x 1 6)  Meloxicam 15 Mg Tabs (Meloxicam) .Marland Kitchen.. 1 tab by mouth daily as needed pain 7)  Acetaminophen 500 Mg Tabs (Acetaminophen) .Marland Kitchen.. 1 tab by mouth q6hrs  Other Orders: Future Orders: Lipid-[facility] ([id number]) ... 06/17/2010  Hypertension Assessment/Plan:      The patient's hypertensive risk group is category C: Target organ damage and/or diabetes.  Her calculated 10 year risk of coronary heart disease is 11 %.  Today's blood pressure is 151/87.  Her blood pressure goal is < 130/80.  Patient Instructions: 1)  It was great meeting you today! 2)  Please come back in 1 month so we can see how your leg pain is doing and recheck your blood pressure 3)  Please make a LAB ONLY appt BEFORE you come see me and come in FASTING so that we can draw some labs and have the results the next time we talk. 4)  I would like for you to get an xray of your leg.  I am giving you the slip to take to radiology over at [facility]. 5)  I also suggest taking Tylenol 500mg  every 6 hours for the next 2 weeks to see if that helps.  I am also giving you a prescription for Mobic 15mg . You can take 1 tab once per day if you need some extra help with pain.  Neither of these should make you sleepy.  6)  Stop Smoking Tips: Choose a  Quit date. Cut down before the Quit date. decide what you will do as a substitute when you feel the urge to smoke(gum,toothpick,exercise). 7)  Schedule your mammogram. 8)  See your eye doctor yearly to check for diabetic eye damage. 9)  Check your feet  each night for sore areas, calluses or signs of infection. Prescriptions: MELOXICAM 15 MG TABS (MELOXICAM) 1 tab by mouth daily as needed pain  #30 x 0   Entered and Authorized by:   Demetria Pore MD   Signed by:   Demetria Pore MD on 04/21/2010   Method used:   Electronically to        [facility]* (retail)       [address]       [address] Shipping/mailing       [place], [place]  [address]       Ph: [phone]       Fax: [phone]   RxID:   [id number] ACETAMINOPHEN 500 MG TABS (ACETAMINOPHEN) 1 tab by mouth q6hrs  #100 x 0   Entered and Authorized by:   Demetria Pore MD   Signed by:   Demetria Pore MD on 04/21/2010   Method used:   Electronically to        [facility]* (retail)       [address]       [address] Shipping/mailing       [place], [place]  [address]       Ph: [phone]       Fax: [phone]   RxID:   [id number] METOPROLOL TARTRATE 50 MG  TABS (METOPROLOL TARTRATE) 1 tab by mouth two times a day for blood pressure.  Take it before you go to work and before you go to bed.  #180 x 0   Entered and Authorized by:   Demetria Pore MD   Signed by:   Demetria Pore MD on 04/21/2010   Method used:   Electronically to        [facility]* (retail)       [address]       [address] Shipping/mailing       [place], [place]  [address]       Ph: [phone]       Fax: [phone]   RxID:   [id number]    Orders Added: 1)  A1C-[facility] [83036] 2)  Radiology other [Radiology Other] 3)  Comp Met-[facility] [[id number]] 4)  Lipid-[facility] [[id number]] 5)  UA Microalbumin-[facility] [82044] 6)  Hepatic-[facility] [[id number]] 7)  [facility]- Est  Level 4 [[address]]    Laboratory Results   Blood Tests   Date/Time Received: April 21, 2010 8:24 AM  Date/Time Reported: April 21, 2010 9:09 AM   HGBA1C: 7.8%   (Normal Range: Non-Diabetic - 3-6%   Control Diabetic - 6-8%)   Comments: ...............test performed by......Marland KitchenBonnie A. [place], MLS (ASCP)cm

## 2010-06-17 NOTE — Assessment & Plan Note (Signed)
Summary: cpe/pap,tcb   Vital Signs:  Patient profile:   45 year old female Height:      66 inches Weight:      192 pounds BMI:     31.10 Temp:     98.9 degrees F oral Pulse rate:   85 / minute BP sitting:   191 / 97  (left arm) Cuff size:   large  Vitals Entered By: Tessie Fass CMA (Sep 19, 2009 9:15 AM)  Serial Vital Signs/Assessments:  Time      Position  BP       Pulse  Resp  Temp     By                     192/100                        Ardeen Garland  MD  CC: complete physical with pap Is Patient Diabetic? Yes Pain Assessment Patient in pain? no        Primary Care Provider:  Ardeen Garland MD  CC:  complete physical with pap.  History of Present Illness: Sally Mclaughlin comes in today for a complete physical.  She has HTN, DM, and HLD.  She has been out of all her meds since December.  She has split from her husband, Dustin Folks (also a patient here).  They will have been married 2 years on 09/25/09.  They are not yet officially divorced.  She has no complaints today. CPE - due for pap.  No history of abnormals.  Wants STD testing.  Due for mammogram. DM - not checking her sugars and out of her meds for over 4 months.  Does not even know where her meter is.  Says she has just not felt like taking care of herself since [place] left.  He used to help her a lot.  HTN - again, out of meds.  No visual changes, headaches, chest pain.  SUpposed to be on lisinopril hctz 40/25 and toprol 50 two times a day.  HLD - out of meds.  Due for recheck.  Supposed to be on pravastatin 40.   Habits & Providers  Alcohol-Tobacco-Diet     Tobacco Status: current     Tobacco Counseling: to quit use of tobacco products     Cigarette Packs/Day: <0.25  Current Medications (verified): 1)  Metoprolol Tartrate 50 Mg  Tabs (Metoprolol Tartrate) .Marland Kitchen.. 1 Tab By Mouth Two Times A Day For Blood Pressure.  Take It Before You Go To Work and Before You Go To Bed. 2)  Lisinopril-Hydrochlorothiazide 20-12.5 Mg  Tabs (Lisinopril-Hydrochlorothiazide) .... 2 Tabs By Mouth Once A Day After You Wake Up/before You Go To Work For Your Blood Pressure. 3)  Metformin Hcl 1000 Mg Tabs (Metformin Hcl) .Marland Kitchen.. 1 Tab Twice A Day For Diabetes 4)  Pravastatin Sodium 40 Mg Tabs (Pravastatin Sodium) .Marland Kitchen.. 1 Tab By Mouth Everyday Before You Go To Sleep For Your Cholesterol.  Past History:  Past Medical History: Last updated: 05/14/2008 Hypertension, Obesity, Glucose Intolerance (124 fasting in December 2009) = Metabolic Syndrome.  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  High salt diet.  Social History: Packs/Day:  <0.25  Physical Exam  General:  obese, alert, NAD vitals reviewed and rechecked Head:  normocephalic and atraumatic.   Eyes:  pupils equal, pupils round, pupils reactive to light, pupils react to accomodation, corneas and lenses clear, no injection, no iris abnormalities, no optic disk abnormalities, and no retinal abnormalitiies.   Mouth:  Oral mucosa and oropharynx without lesions or exudates.  Teeth in good repair. Neck:  No deformities, masses, or tenderness noted. Breasts:  No mass, nodules, thickening, tenderness, bulging, retraction, inflamation, nipple discharge or skin changes noted.   Lungs:  Normal respiratory effort, chest expands symmetrically. Lungs are clear to auscultation, no crackles or wheezes. Heart:  Normal rate and regular rhythm. S1 and S2 normal without gallop, murmur, click, rub or other extra sounds. Abdomen:  Bowel sounds positive,abdomen soft and non-tender without masses, organomegaly or hernias  noted. Genitalia:  Normal introitus for age, no external lesions, no vaginal discharge, mucosa pink and moist, no vaginal or cervical lesions, no vaginal atrophy, no friaility or hemorrhage, normal uterus size and position, no adnexal masses or tenderness Msk:  No deformity or scoliosis noted of thoracic or lumbar spine.   Pulses:  2+ radial and dp pulses Extremities:  no edema Skin:  Intact without suspicious lesions or rashes Cervical Nodes:  No lymphadenopathy noted Axillary Nodes:  No palpable lymphadenopathy Psych:  Cognition and judgment appear intact. Alert and cooperative with normal attention span and concentration. No apparent delusions, illusions, hallucinations   Impression & Recommendations:  Problem # 1:  ROUTINE GYNECOLOGICAL EXAMINATION (ICD-V72.31) Assessment New  Exam normal except for BP.  See below.  Pap obtained. Patient referred for mammo.   Orders: [facility] - Est  40-64 yrs (96045)  Problem # 2:  DIABETES MELLITUS, TYPE II, UNCONTROLLED (ICD-250.02) Assessment: Deteriorated A1C pending.  SUspect worsening as off meds.  Restart metformin.  Gave new Rx for meter, strips, lancets.  Check sugar two times a day per patient instructions.  Return in 2-4 weeks.  May need to add glipizide as she has not been able to modify diet.  Her updated medication list for this problem includes:    Lisinopril-hydrochlorothiazide 20-12.5 Mg Tabs (Lisinopril-hydrochlorothiazide) .Marland Kitchen... 2 tabs by mouth once a day after you wake up/before you go to work for your blood pressure.    Metformin Hcl 1000 Mg Tabs (Metformin hcl) .Marland Kitchen... 1 tab twice a day for diabetes  Orders: A1C-[facility] ([id number])Future Orders: Comp Met-[facility] ([id number]) ... 11/14/2009  Problem # 3:  HYPERTENSION, BENIGN SYSTEMIC (ICD-401.1) Assessment: Deteriorated Restart meds gradually given level of long-standing HTN.  SEe patient instructions.  Recheck in 2-4 weeks. Returning for fasting labs - pay close attention to cr.  Her  updated medication list for this problem includes:    Metoprolol Tartrate 50 Mg Tabs (Metoprolol tartrate) .Marland Kitchen... 1 tab by mouth two times a day for blood pressure.  take it before you go to work and before you go to bed.    Lisinopril-hydrochlorothiazide 20-12.5 Mg Tabs (Lisinopril-hydrochlorothiazide) .Marland Kitchen... 2 tabs by mouth once a day after you wake up/before you go to work for your blood pressure.  Problem # 4:  HYPERLIPIDEMIA (ICD-272.4) Assessment: Unchanged restart statin.  check FLP Her updated medication list for this problem includes:    Pravastatin Sodium 40 Mg Tabs (Pravastatin sodium) .Marland Kitchen... 1 tab by mouth everyday before you go to sleep for your cholesterol.  Future Orders: Comp Met-[facility] [id number]) ... 11/14/2009 Lipid-[facility] ([id number]) .Marland KitchenMarland Kitchen  11/14/2009 TSH-[facility] [phone]) ... 11/14/2009  Complete Medication List: 1)  Metoprolol Tartrate 50 Mg Tabs (Metoprolol tartrate) .Marland Kitchen.. 1 tab by mouth two times a day for blood pressure.  take it before you go to work and before you go to bed. 2)  Lisinopril-hydrochlorothiazide 20-12.5 Mg Tabs (Lisinopril-hydrochlorothiazide) .... 2 tabs by mouth once a day after you wake up/before you go to work for your blood pressure. 3)  Metformin Hcl 1000 Mg Tabs (Metformin hcl) .Marland Kitchen.. 1 tab twice a day for diabetes 4)  Pravastatin Sodium 40 Mg Tabs (Pravastatin sodium) .Marland Kitchen.. 1 tab by mouth everyday before you go to sleep for your cholesterol.  Other Orders: Pap Smear-[facility] ([id number]) GC/Chlamydia-[facility] ([id number]) Future Orders: RPR-[facility] ([id number]) ... 11/14/2009 Hep Bs Ab-[facility] ([id number]) ... 11/14/2009 Hep Bs Ag-[facility] ([id number]) ... 11/14/2009 Hep C Ab-[facility] ([id number]) ... 11/14/2009 HIV-[facility] ([id number]) ... 11/14/2009  Patient Instructions: 1)  THank you for coming in today.  I am sorry to hear about you and Alinda Money.  I hope you are able to work things out. 2)  I will send you a letter with the results of your pap smear. 3)  Please  schedule your mammogram at your earliest convenience. 4)  Restart your medicines ASAP! 5)  I want you to restart them like this: 6)  Start metformin 1 tab twice a day. 7)  Start Pravastatin 1 tab before going to sleep. 8)  Start metroprolol today and take only the metroprolol for 2 days.  Then continue the metoprolol and after two days start taking 1 tab of the lisinopril-hctz for 2 more days.  THen after 2 more days, start takign 2 tabs of the lisinopril-hctz as well as your metorprolol.  Stay on that dosing until you see me again. 9)  Come back in for fasting bloodwork as soon as you can.  You can make an appointment with me at 8:30 that same day.  Bring in a record of your sugars.  I want you to check twice a day - fasting (after you wake up) and then 2 hours after your biggest meal.  Write them down and bring them in.  Prescriptions: PRAVASTATIN SODIUM 40 MG TABS (PRAVASTATIN SODIUM) 1 tab by mouth everyday before you go to sleep for your cholesterol.  #90 x 0   Entered and Authorized by:   Ardeen Garland  MD   Signed by:   Ardeen Garland  MD on 09/19/2009   Method used:   Print then Give to Patient   RxID:   [id number] METFORMIN HCL 1000 MG TABS (METFORMIN HCL) 1 tab twice a day for diabetes  #180 x 0   Entered and Authorized by:   Ardeen Garland  MD   Signed by:   Ardeen Garland  MD on 09/19/2009   Method used:   Print then Give to Patient   RxID:   [id number] LISINOPRIL-HYDROCHLOROTHIAZIDE 20-12.5 MG TABS (LISINOPRIL-HYDROCHLOROTHIAZIDE) 2 tabs by mouth once a day after you wake up/before you go to work for your blood pressure.  #180 x 0   Entered and Authorized by:   Ardeen Garland  MD   Signed by:   Ardeen Garland  MD on 09/19/2009   Method used:   Print then Give to Patient   RxID:   [id number] METOPROLOL TARTRATE 50 MG  TABS (METOPROLOL TARTRATE) 1 tab by mouth two times a day for blood pressure.  Take it before you go to work and before you go to bed.  #180 x 0   Entered  and  Authorized by:   Ardeen Garland  MD   Signed by:   Ardeen Garland  MD on 09/19/2009   Method used:   Print then Give to Patient   RxID:   [id number] PRAVASTATIN SODIUM 40 MG TABS (PRAVASTATIN SODIUM) 1 tab by mouth everyday before you go to sleep for your cholesterol.  #90 x 0   Entered and Authorized by:   Ardeen Garland  MD   Signed by:   Ardeen Garland  MD on 09/19/2009   Method used:   Print then Give to Patient   RxID:   [id number] METFORMIN HCL 1000 MG TABS (METFORMIN HCL) 1 tab twice a day for diabetes  #180 x 0   Entered and Authorized by:   Ardeen Garland  MD   Signed by:   Ardeen Garland  MD on 09/19/2009   Method used:   Print then Give to Patient   RxID:   [id number] LISINOPRIL-HYDROCHLOROTHIAZIDE 20-12.5 MG TABS (LISINOPRIL-HYDROCHLOROTHIAZIDE) 2 tabs by mouth once a day after you wake up/before you go to work for your blood pressure.  #180 x 0   Entered and Authorized by:   Ardeen Garland  MD   Signed by:   Ardeen Garland  MD on 09/19/2009   Method used:   Print then Give to Patient   RxID:   [id number] METOPROLOL TARTRATE 50 MG  TABS (METOPROLOL TARTRATE) 1 tab by mouth two times a day for blood pressure.  Take it before you go to work and before you go to bed.  #180 x 0   Entered and Authorized by:   Ardeen Garland  MD   Signed by:   Ardeen Garland  MD on 09/19/2009   Method used:   Print then Give to Patient   RxID:   [id number]   Laboratory Results   Blood Tests   Date/Time Received: Sep 19, 2009 9:54 AM  Date/Time Reported: Sep 19, 2009 11:50 AM   HGBA1C: 6.9%   (Normal Range: Non-Diabetic - 3-6%   Control Diabetic - 6-8%)  Comments: ...............test performed by......Marland KitchenBonnie A. [place], MLS (ASCP)cm

## 2010-06-17 NOTE — Miscellaneous (Signed)
Summary: Trich on pap  Clinical Lists Changes  Medications: Added new medication of METRONIDAZOLE 500 MG TABS (METRONIDAZOLE) 4 tabs by mouth x 1 today - Signed Rx of METRONIDAZOLE 500 MG TABS (METRONIDAZOLE) 4 tabs by mouth x 1 today;  #4 x 0;  Signed;  Entered by: Ardeen Garland  MD;  Authorized by: Ardeen Garland  MD;  Method used: Print then Give to Patient    Prescriptions: METRONIDAZOLE 500 MG TABS (METRONIDAZOLE) 4 tabs by mouth x 1 today  #4 x 0   Entered and Authorized by:   Ardeen Garland  MD   Signed by:   Ardeen Garland  MD on 09/23/2009   Method used:   Print then Give to Patient   RxID:   [id number]

## 2010-06-17 NOTE — Assessment & Plan Note (Signed)
Summary: STD TX PER DR Arlone Lenhardt/[facility]  Nurse Visit patient received message left on voicemail by MD and is here to discuss.  explained diagnosis .Paged Dr. Georgiana Shore and she orders rx to treat trich. patient will pick up now. advised to tell husband he will need to be treated also and to schedule appointment.  Theresia Lo RN  October 30, 2009 9:27 AM   Orders Added: 1)  No Charge Patient Arrived (NCPA0) [NCPA0] Prescriptions: METRONIDAZOLE 500 MG TABS (METRONIDAZOLE) take four tabs by mouth X 1  #4 x 0   Entered by:   Theresia Lo RN   Authorized by:   Ardeen Garland  MD   Signed by:   Ardeen Garland  MD on 10/30/2009   Method used:   Telephoned to ...       [facility]* (retail)       [address]       [address] Shipping/mailing       [place], [place]  [address]       Ph: [phone]       Fax: [phone]   RxID:   [id number]

## 2010-10-03 NOTE — Op Note (Signed)
[facility]  Patient:    Sally Mclaughlin, Sally Mclaughlin                      MRN: [id number] Proc. Date: 07/16/00 Adm. Date:  [id number] Disc. Date: [id number] Attending:  Arlis Porta CC:         Patricia Pesa, M.D.   Operative Report  PREOPERATIVE DIAGNOSIS:  Ventral hernia.  POSTOPERATIVE DIAGNOSIS:  Incarcerated ventral hernia.  PROCEDURE:  Laparoscopic repair of incarcerated ventral hernia with mesh.  SURGEON:  Adolph Pollack, M.D.  ASSISTANT:  Gwenlyn Perking, M.D.  ANESTHESIA:  General.  INDICATION:  Sally Mclaughlin is a 45 year old female who has been having increasing epigastric pain and noticed a bulge.  On exam, she has an obvious hernia just above the umbilicus, and now she presents for laparoscopic repair.  TECHNIQUE:  She is placed supine on the operating table and given a general anesthetic.  A Foley catheter is placed in her bladder.  Her abdomen is sterilely prepped and draped.  In the right mid lateral abdomen, an incision was made and carried down to the fascial layers which were divided sequentially, and the peritoneal cavity was entered bluntly and under direct vision.  A pursestring suture was placed around the anterior fascial layer and the Hasson trocar introduced into the peritoneal cavity.  A pneumoperitoneum was created by insufflation of CO2 gas.  Laparoscopic was introduced.  It was noted that omentum was incarcerated up in the epigastric ventral hernia. Under direct vision, a 5 mm trocar was placed in the right lower quadrant, and the omentum was then reduced from the epigastric hernia.  I examined the umbilical area, as she had had a laparoscopic tubal ligation and noticed that this had a small hernia at the incision site as well.  I subsequently placed spinal needles around the sites of the hernia and then measured 3 cm away from each of these in each quadrant.  I drew an oval which measured 14 x 12 cm.  I brought an  appropriate sized piece of Gore-Tex dual mesh into the field, then cut it to the proper size.  I subsequently put anchoring sutures of 0 Novofil in four quadrants of the mesh.  I made four incisions in the skin at the four quadrants corresponding to the anchoring sutures placed.  The mesh was then rolled up and then passed into the abdominal cavity.  The mesh was unrolled into the abdominal cavity, and each of the sutures in the appropriate quadrant were grasped with an Endoclose device and brought up through a fascial bridge.  These were all then pulled out, and the mesh was approximated to the anterior abdominal wall.  These sutures were then tied down.  The mesh was further approximated and fixed to the anterior abdominal wall with spiral tacks at its periphery.  This allowed for more than adequate coverage of both defects.  Hemostasis was also adequate at this time.  The pneumoperitoneum was released, and I watched the approximation of the omentum to the mesh.  It should be noted that the smooth side of the mesh was facing the viscera and the rough side facing the anterior abdominal wall.  Subsequently, I removed all the trocars.  The right mid abdominal fascial defect was closed by tightening up and tying down the pursestring suture.  All incisions were then closed with 4-0 Monocryl subcuticular stitches followed by Steri-Strips and sterile dressings.  She tolerated the procedure well without any  apparent complications and was taken to the recovery room in satisfactory condition.  She will be placed overnight in a bed. DD:  07/16/00 TD:  07/18/00 Job: [id number]

## 2011-01-08 ENCOUNTER — Inpatient Hospital Stay (INDEPENDENT_AMBULATORY_CARE_PROVIDER_SITE_OTHER)
Admission: RE | Admit: 2011-01-08 | Discharge: 2011-01-08 | Disposition: A | Discharge status: Home / Self Care | Payer: 59 | Source: Ambulatory Visit | Attending: Family Medicine | Admitting: Family Medicine

## 2011-01-08 DIAGNOSIS — H109 Unspecified conjunctivitis: Secondary | ICD-10-CM

## 2011-03-23 ENCOUNTER — Other Ambulatory Visit: Payer: Self-pay | Admitting: Family Medicine

## 2011-03-23 DIAGNOSIS — Z1231 Encounter for screening mammogram for malignant neoplasm of breast: Secondary | ICD-10-CM

## 2011-05-04 ENCOUNTER — Ambulatory Visit (HOSPITAL_COMMUNITY)
Admission: RE | Admit: 2011-05-04 | Discharge: 2011-05-04 | Disposition: A | Discharge status: Home / Self Care | Payer: 59 | Source: Ambulatory Visit | Attending: Family Medicine | Admitting: Family Medicine

## 2011-05-04 DIAGNOSIS — Z1231 Encounter for screening mammogram for malignant neoplasm of breast: Secondary | ICD-10-CM | POA: Insufficient documentation

## 2012-03-31 ENCOUNTER — Other Ambulatory Visit: Payer: Self-pay | Admitting: Family Medicine

## 2012-03-31 DIAGNOSIS — Z1231 Encounter for screening mammogram for malignant neoplasm of breast: Secondary | ICD-10-CM

## 2012-04-06 ENCOUNTER — Ambulatory Visit (INDEPENDENT_AMBULATORY_CARE_PROVIDER_SITE_OTHER): Payer: 59 | Admitting: Family Medicine

## 2012-04-06 VITALS — BP 188/89 | HR 80 | Temp 99.0°F | Wt 185.7 lb

## 2012-04-06 DIAGNOSIS — N898 Other specified noninflammatory disorders of vagina: Secondary | ICD-10-CM | POA: Insufficient documentation

## 2012-04-06 DIAGNOSIS — I10 Essential (primary) hypertension: Secondary | ICD-10-CM

## 2012-04-06 DIAGNOSIS — M549 Dorsalgia, unspecified: Secondary | ICD-10-CM | POA: Insufficient documentation

## 2012-04-06 DIAGNOSIS — L293 Anogenital pruritus, unspecified: Secondary | ICD-10-CM

## 2012-04-06 MED ORDER — CYCLOBENZAPRINE HCL 5 MG PO TABS: 5.0000 mg | ORAL_TABLET | Freq: Three times a day (TID) | ORAL | Status: DC | PRN

## 2012-04-06 MED ORDER — FLUCONAZOLE 150 MG PO TABS: 150.0000 mg | ORAL_TABLET | Freq: Once | ORAL | Status: DC

## 2012-04-06 NOTE — Assessment & Plan Note (Signed)
Low back and LT side pain likely MSK etiology due to hx of heavy lifting at work.  No complaints of hematuria or dysuria. - Will treat back and flank pain with Flexeril 5 mg TID PRN x 2 weeks - Continue alternating Tylenol and Aleve x 2 weeks - If pain worsens or does improve with conservative treatment, patient to return to clinic - Red flags reviewed per AVS

## 2012-04-06 NOTE — Assessment & Plan Note (Signed)
BP elevated today 188/89.  May be secondary to back pain.  However, she told my nurse that she has been out of all medications and tried to request refills from PCP, but patient has not been seen in about 1-2 years.   - Advised patient to schedule follow up appointment with PCP to discuss chronic issues and med refills

## 2012-04-06 NOTE — Assessment & Plan Note (Signed)
Patient not too concerned about this, but agreeable to Diflucan x one dose.

## 2012-04-06 NOTE — Progress Notes (Signed)
  Subjective:    Patient ID: Sally Mclaughlin, female    DOB: 1966-03-17, 46 y.o.   MRN: [id number]  HPI  Patient presents to same day clinic for medication refills and to discuss LT side and back pain.  Patient also is concerned she has a vaginal yeast infection.  Today, patient is mostly concerned about low back pain.  Symptoms started about 3 weeks ago.  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  Patient asking for a muscle relaxer.  She has tried OTC Aleve and Motrin which helps relieve pain for 6-8 hours, but then pain returns.  Located LT thoracic spine, does not radiate.  Denies any pain with urination.  Denies any hematuria.  She does however complain of vaginal itching, but no discharge.  Of note, patient's BP is elevated today.  Patient has not been to our clinic in over 2 years and has not been able to have medications refilled.  Patient understands that she needs to schedule an appointment with PCP to discuss chronic issues/refills.  Review of Systems Denies associated fever, chills, NS, abdominal or pelvic pain.    Objective:   Physical Exam  Constitutional: She appears well-nourished. No distress.  Musculoskeletal:       Back: tenderness on palpation of paraspinal muscles LT thoracic spine; no bony tenderness; muscles feel tighter and more firm on LT compared to RT; no skin changes or erythema/swelling; Full ROM with rotation; Limited ROM with flexion and extension secondary to pain  Psychiatric: Her behavior is normal. Her mood appears anxious. Her speech is rapid and/or pressured.       Assessment & Plan:

## 2012-04-06 NOTE — Patient Instructions (Addendum)
It was nice to meet you today, Sally Mclaughlin. For side pain, please start taking Flexeril 5 mg every 8 hours as needed for pain x 2 weeks. Continue alternating Tylenol and Aleve every 6 hours as needed for pain. Try to perform stretches every morning and before you go into work. Schedule follow up appointment with your PCP in 2 weeks or sooner if pain does not improve.

## 2012-05-04 ENCOUNTER — Ambulatory Visit (HOSPITAL_COMMUNITY)
Admission: RE | Admit: 2012-05-04 | Discharge: 2012-05-04 | Disposition: A | Discharge status: Home / Self Care | Payer: 59 | Source: Ambulatory Visit | Attending: Family Medicine | Admitting: Family Medicine

## 2012-05-04 DIAGNOSIS — Z1231 Encounter for screening mammogram for malignant neoplasm of breast: Secondary | ICD-10-CM | POA: Insufficient documentation

## 2012-11-16 ENCOUNTER — Encounter: Payer: Self-pay | Admitting: Family Medicine

## 2012-11-16 ENCOUNTER — Other Ambulatory Visit (HOSPITAL_COMMUNITY)
Admission: RE | Admit: 2012-11-16 | Discharge: 2012-11-16 | Disposition: A | Discharge status: Home / Self Care | Payer: 59 | Source: Ambulatory Visit | Attending: Family Medicine | Admitting: Family Medicine

## 2012-11-16 ENCOUNTER — Ambulatory Visit (INDEPENDENT_AMBULATORY_CARE_PROVIDER_SITE_OTHER): Payer: 59 | Admitting: Family Medicine

## 2012-11-16 VITALS — BP 146/81 | HR 79 | Temp 98.1°F | Ht 66.0 in | Wt 174.0 lb

## 2012-11-16 DIAGNOSIS — N760 Acute vaginitis: Secondary | ICD-10-CM

## 2012-11-16 DIAGNOSIS — Z113 Encounter for screening for infections with a predominantly sexual mode of transmission: Secondary | ICD-10-CM | POA: Insufficient documentation

## 2012-11-16 DIAGNOSIS — L293 Anogenital pruritus, unspecified: Secondary | ICD-10-CM

## 2012-11-16 DIAGNOSIS — N898 Other specified noninflammatory disorders of vagina: Secondary | ICD-10-CM

## 2012-11-16 LAB — POCT WET PREP (WET MOUNT)

## 2012-11-16 MED ORDER — FLUCONAZOLE 150 MG PO TABS: 150.0000 mg | ORAL_TABLET | Freq: Once | ORAL | Status: DC

## 2012-11-16 MED ORDER — METRONIDAZOLE 500 MG PO TABS: 2000.0000 mg | ORAL_TABLET | Freq: Once | ORAL | Status: DC

## 2012-11-17 ENCOUNTER — Encounter: Payer: Self-pay | Admitting: Family Medicine

## 2012-11-17 NOTE — Patient Instructions (Signed)
Trichinosis °Trichinosis is an infection caused by eating the raw, or poorly cooked, meat of meat-eating animals that are infected. The infection is caused by eating the encysted larvae (like a small egg with a tiny worm inside) of the nematode (very small worm) called Trichinella spiralis. It is found in the infected meat of these animals. The seriousness of the disease is usually related to the number of larvae eaten. Once the cyst is in the intestines, the larva is released. These tiny worms then enter the small blood vessels in the intestines and travel throughout the body. They can infect all tissues of the body. °SYMPTOMS  °When the larvae are in the intestine, they can cause diarrhea and abdominal (belly) pain. There may be swelling around the eyes and muscle aches and pains. The diaphragm (breathing muscle between the chest and abdomen), chest muscles between the ribs, and the muscles of the face and the tongue are also affected. This disease is usually self limited. That means you will usually get well in time without treatment. It can rarely result in death only if there are complications from heavy invasion of the heart, lungs, or central nervous system (brain and spinal cord). °DIAGNOSIS  °Laboratory blood tests are available that can usually make a positive diagnosis. Examining the infected muscle under the microscope may also help with the diagnosis. If laboratory work is performed, make sure you know how you are to obtain the results. It is your responsibility to follow up and obtain your laboratory results. °TREATMENT  °· There is no known treatment for Trichinosis except for treatment of symptoms. Usually anti-inflammatory medications are used. °· Only take over-the-counter or prescription medicines for pain, discomfort, or fever as directed by your caregiver. Discontinue immediately if you have stomach upset. Take medicine only as directed by your caregiver. °· Thiabendazole is used as a medication  for known exposure. Steroids are also used for severe cases. °· Most symptoms will get better on their own within a year without lasting problems. However, you will be infected for the rest of your life. You cannot pass this infection on to another person even with close personal contact. °SEEK IMMEDIATE MEDICAL CARE IF: °· You develop any new symptoms such as vomiting, severe headache, stiff or painful neck, chest pain, shortness of breath, trouble breathing, or develop pain uncontrolled with medications. °· You develop new problems or worsening of the problems that originally brought you in for care. °Document Released: 08/10/2000 Document Revised: 07/27/2011 Document Reviewed: 09/08/2007 °ExitCare® Patient Information ©2014 [employer]. ° °

## 2012-11-17 NOTE — Progress Notes (Signed)
Subjective:     Patient ID: Sally Mclaughlin, female   DOB: April 01, 1966, 47 y.o.   MRN: [id number]  HPI Vaginal pain: Sally Mclaughlin comes in today complaining of vaginal burning that started last night with her bubble bath. She reports in burning her external vaginal area when she sat in the water. She then noticed today that it started burning "up inside" of her. She has had no fevers or abdominal pain. She denies sex with a new partner, but does not seem to think she can say the same for her husband. She had a trich infection in March of this year. She does admit to switching  soaps last week, but does not have reaction any where else on her body. She had sexual intercourse 1 week ago with husband. She denies discharge.   Review of Systems See above HPI    Objective:   Physical Exam BP 146/81  Pulse 79  Temp(Src) 98.1 F (36.7 C) (Oral)  Ht 5\' 6"  (1.676 m)  Wt 174 lb (78.926 kg)  BMI 28.1 kg/m2 Gen: boisterous talker. Happy. NAD Abd: SOft. NTND. No masses. GYN:  External genitalia dry, cracked slightly pearly white appearance with erythema in areas as well.  Introitus with erythema. Vaginal mucosa pink, moist, normal rugae.  Nonfriable cervix without lesions, moderate thick white discharge, no bleeding noted on speculum exam.  Bimanual exam revealed normal, nongravid uterus.  No cervical motion tenderness. No adnexal masses bilaterally.

## 2012-11-17 NOTE — Assessment & Plan Note (Signed)
-   wet prep today did not prove to be a source of infection; however with thick discharge and irritation that looked yeasty and recent trich infection, without known cure of husband, will treat for both.  - If this is a chronic issue, may start to consider atrophic vaginitis. External genitalia had that appearance as well and could not decipher if this was an acute infection on atropic vaginitis. She may benefit from estrogen cream. - F/U: PRN

## 2013-03-28 ENCOUNTER — Other Ambulatory Visit (HOSPITAL_COMMUNITY)
Admission: RE | Admit: 2013-03-28 | Discharge: 2013-03-28 | Disposition: A | Discharge status: Home / Self Care | Payer: 59 | Source: Ambulatory Visit | Attending: Family Medicine | Admitting: Family Medicine

## 2013-03-28 ENCOUNTER — Encounter: Payer: Self-pay | Admitting: Family Medicine

## 2013-03-28 ENCOUNTER — Ambulatory Visit (INDEPENDENT_AMBULATORY_CARE_PROVIDER_SITE_OTHER): Payer: 59 | Admitting: Family Medicine

## 2013-03-28 VITALS — BP 172/90 | HR 97 | Ht 66.0 in | Wt 174.0 lb

## 2013-03-28 DIAGNOSIS — L293 Anogenital pruritus, unspecified: Secondary | ICD-10-CM

## 2013-03-28 DIAGNOSIS — N912 Amenorrhea, unspecified: Secondary | ICD-10-CM

## 2013-03-28 DIAGNOSIS — E785 Hyperlipidemia, unspecified: Secondary | ICD-10-CM

## 2013-03-28 DIAGNOSIS — N760 Acute vaginitis: Secondary | ICD-10-CM | POA: Insufficient documentation

## 2013-03-28 DIAGNOSIS — N898 Other specified noninflammatory disorders of vagina: Secondary | ICD-10-CM

## 2013-03-28 DIAGNOSIS — IMO0001 Reserved for inherently not codable concepts without codable children: Secondary | ICD-10-CM

## 2013-03-28 DIAGNOSIS — Z113 Encounter for screening for infections with a predominantly sexual mode of transmission: Secondary | ICD-10-CM | POA: Insufficient documentation

## 2013-03-28 DIAGNOSIS — I10 Essential (primary) hypertension: Secondary | ICD-10-CM

## 2013-03-28 LAB — COMPREHENSIVE METABOLIC PANEL
ALT: 19 U/L (ref 0–35)
AST: 19 U/L (ref 0–37)
Albumin: 4.1 g/dL (ref 3.5–5.2)
Calcium: 10.3 mg/dL (ref 8.4–10.5)
Chloride: 99 mEq/L (ref 96–112)
Potassium: 4.8 mEq/L (ref 3.5–5.3)
Total Protein: 8.1 g/dL (ref 6.0–8.3)

## 2013-03-28 LAB — POCT WET PREP (WET MOUNT): Clue Cells Wet Prep Whiff POC: NEGATIVE

## 2013-03-28 LAB — POCT URINALYSIS DIPSTICK
Leukocytes, UA: NEGATIVE
Protein, UA: 100
Spec Grav, UA: >=1.03
Urobilinogen, UA: 0.2

## 2013-03-28 LAB — POCT GLYCOSYLATED HEMOGLOBIN (HGB A1C): Hemoglobin A1C: >14

## 2013-03-28 LAB — LIPID PANEL
LDL Cholesterol: 164 mg/dL — ABNORMAL HIGH (ref 0–99)
Total CHOL/HDL Ratio: 4 Ratio
VLDL: 29 mg/dL (ref 0–40)

## 2013-03-28 LAB — POCT UA - MICROSCOPIC ONLY

## 2013-03-28 MED ORDER — LISINOPRIL-HYDROCHLOROTHIAZIDE 20-12.5 MG PO TABS: 1.0000 | ORAL_TABLET | Freq: Every day | ORAL | Status: DC

## 2013-03-28 NOTE — Addendum Note (Signed)
Addended by: Jennette Bill on: 03/28/2013 01:52 PM   Modules accepted: Orders

## 2013-03-28 NOTE — Assessment & Plan Note (Signed)
Wet prep negative; to wait for cervical cultures.  She gives verbal permission for me to call her husband Melvin's cell phone [phone] to discuss results with him.

## 2013-03-28 NOTE — Assessment & Plan Note (Signed)
Patient with diagnosis of DM, now off all DM and HTN meds, statins.  To check labs today; restart lisinopril/HCTZ today, and will recommend recheck of BMet in the coming 2 weeks.  She states that she is not keen on frequent doctor's visits.  She is made aware of the importance of regular visits for monitoring of these chronic conditions.

## 2013-03-28 NOTE — Progress Notes (Signed)
  Subjective:    Patient ID: Sally Mclaughlin, female    DOB: 21-Sep-1965, 47 y.o.   MRN: [id number]  HPI Patient presents for SDA accompanied by her husband Alinda Money, for several complaints:  1. Vaginal itching that has been ongoing for indeterminate period of time.  LMP in July 2014. Will intermittently skip a month without menses.  Denies dysuria, fevers or chills.  According to record, she has had trichomonas in the past.   2. Feels like she's going through the "change"; feels sweaty from time to time.  Polyuria, polydipsia.  She has a diagnosis of DM and has been prescribed metformin in the past, she relates that she stopped all her chronic meds (Metformin, BP meds, statin, ASA) that she used to take.  Has been a long time since she last took these.   Family Hx; HTN in many family members, including both parents.  Father with DM>   Social Hx; She works in housekeeping at [employer].  Smokes 1 pack/cigarettes in a week; drinks 2 qts beer in a week, according to husband's estimation (patient defers estimate to husband). She denies any other substance use.    Review of Systems No fevers or chills; endorses generalized fatigue.  No cough. No dysuria. Endorses "hypersensitivity" of arms and feet.      Objective:   Physical Exam Well appearing, no apparent distress HEENT neck supple, no cervical adenopathy, no thyromegaly or tenderness. Mildly dry mucus membranes.  COR Regular S1S2, no extra sounds PULM Clear bilat.  ABD soft, nontender GYN: Normal vagina mucosa. Thin white discharge in fornix, normal appearing cervix on speculum exam.  LE" no edema noted, palpable dp pulses.        Assessment & Plan:

## 2013-03-28 NOTE — Patient Instructions (Signed)
It was a pleasure to see you today.  I am ordering labs today (fasting) and will contact you with the results.   I re-ordered your blood pressure medicine, Lisinopril/HCTZ 1 tablet daily at the [facility].  Take a baby aspirin (81mg ) once daily for heart protection.  I recommend you to quit smoking cigarettes.   It is important that you make a follow up appointment in 2 weeks.

## 2013-03-28 NOTE — Assessment & Plan Note (Signed)
Sx of polyuria, polydipsia, c/w uncontrolled DM>  Labs as ordered. Close follow up with primary physician.

## 2013-03-29 ENCOUNTER — Telehealth: Payer: Self-pay | Admitting: Family Medicine

## 2013-03-29 ENCOUNTER — Encounter: Payer: Self-pay | Admitting: Family Medicine

## 2013-03-29 MED ORDER — METFORMIN HCL 500 MG PO TABS: ORAL_TABLET | ORAL | Status: DC

## 2013-03-29 NOTE — Telephone Encounter (Signed)
[phone] Called to report results of patient's labs, patient gave me verbal consent to relay clinical information to husband Alinda Money at this phone number (cell).  I called number, got voice mailbox. Left voicemail with office phone number.  I would like to relay that the patient's glucose is uncontrolled (A1C>14%), may have a component of perimenopausal symptoms as well.  Awaiting cervical cultures as well.  No yeast, trich or clue cells seen on wet prep in office.  WIll need follow up with her primary physician for management of BP and DM.  Paula Compton, MD

## 2013-03-29 NOTE — Telephone Encounter (Signed)
Received call from patient's husband Alinda Money, returning my call from earlier about her labs.  Discussed her elevated A1C and need for glucose control.  Will resend her prescription for Metformin.  Also discussed her perimenopausal/menopausal state based on [facility] elevation.  Will leave further treatment of this to primary at follow up visit, as well as reinstatement of lipid therapy. Awaiting results of cervical cultures.  Paula Compton, MD

## 2013-04-03 ENCOUNTER — Other Ambulatory Visit: Payer: Self-pay | Admitting: Family Medicine

## 2013-04-03 DIAGNOSIS — Z1231 Encounter for screening mammogram for malignant neoplasm of breast: Secondary | ICD-10-CM

## 2013-04-11 ENCOUNTER — Ambulatory Visit (INDEPENDENT_AMBULATORY_CARE_PROVIDER_SITE_OTHER): Payer: 59 | Admitting: Family Medicine

## 2013-04-11 ENCOUNTER — Encounter: Payer: Self-pay | Admitting: Family Medicine

## 2013-04-11 VITALS — Ht 66.0 in | Wt 172.0 lb

## 2013-04-11 DIAGNOSIS — L293 Anogenital pruritus, unspecified: Secondary | ICD-10-CM

## 2013-04-11 DIAGNOSIS — E785 Hyperlipidemia, unspecified: Secondary | ICD-10-CM

## 2013-04-11 DIAGNOSIS — N898 Other specified noninflammatory disorders of vagina: Secondary | ICD-10-CM

## 2013-04-11 MED ORDER — METFORMIN HCL 1000 MG PO TABS: ORAL_TABLET | ORAL | Status: DC

## 2013-04-11 MED ORDER — ESTROGENS, CONJUGATED 0.625 MG/GM VA CREA: 1.0000 | TOPICAL_CREAM | Freq: Every day | VAGINAL | Status: DC

## 2013-04-11 MED ORDER — ATORVASTATIN CALCIUM 80 MG PO TABS: 80.0000 mg | ORAL_TABLET | Freq: Every day | ORAL | Status: DC

## 2013-04-11 NOTE — Progress Notes (Signed)
  Subjective:    Patient ID: Sally Mclaughlin, female    DOB: [birth year], 47 y.o.   MRN: [id number]  Diabetes Pertinent negatives for hypoglycemia include no dizziness. Associated symptoms include polydipsia and polyuria. Pertinent negatives for diabetes include no chest pain and no weakness.  Hypertension Pertinent negatives include no chest pain, palpitations or shortness of breath.   CHRONIC DIABETES  Disease Monitoring  Blood Sugar Ranges: doesn't check  Polyuria: yes, improved   Visual problems: no   Medication Compliance: yes  Medication Side Effects  Hypoglycemia: no symptoms   CHRONIC HYPERTENSION  Disease Monitoring  Blood pressure range: 150-170/80-90  Chest pain: no   Dyspnea: no   Claudication: no   Medication compliance: yes  Medication Side Effects  Lightheadedness: no   Urinary frequency: yes, already had this from uncontrolled diabetes   Edema: no   Impotence: no   Preventitive Healthcare:  Exercise: yes, walking for 60 minutes at work   Diet Pattern: increasing veggies and decreasing portions  Salt Restriction: trying     Review of Systems  Constitutional: Positive for unexpected weight change.  Respiratory: Negative for cough and shortness of breath.   Cardiovascular: Negative for chest pain and palpitations.  Gastrointestinal: Negative for nausea, vomiting, abdominal pain, diarrhea and constipation.  Endocrine: Positive for polydipsia and polyuria.  Neurological: Positive for numbness. Negative for dizziness, weakness and light-headedness.  All other systems reviewed and are negative.       Objective:   Physical Exam  Nursing note and vitals reviewed. Constitutional: She is oriented to person, place, and time. She appears well-developed and well-nourished. No distress.  HENT:  Head: Normocephalic and atraumatic.  Right Ear: External ear normal.  Left Ear: External ear normal.  Nose: Nose normal.  Eyes: Conjunctivae are normal. Right eye  exhibits no discharge. Left eye exhibits no discharge. No scleral icterus.  Cardiovascular: Normal rate, regular rhythm, normal heart sounds and intact distal pulses.   No murmur heard. Pulmonary/Chest: Effort normal and breath sounds normal. No respiratory distress. She has no wheezes.  Abdominal: Soft. She exhibits no distension and no mass. There is no tenderness. There is no rebound and no guarding.  Neurological: She is alert and oriented to person, place, and time. No sensory deficit.  Skin: Skin is warm and dry. No rash noted. She is not diaphoretic. No pallor.  Psychiatric: She has a normal mood and affect. Judgment and thought content normal. Her speech is rapid and/or pressured. She is agitated.          Assessment & Plan:

## 2013-04-11 NOTE — Assessment & Plan Note (Signed)
Cholesterol elevated at 11/11 visit, ASCVD 10-year risk of 23.6% - start atorvastatin 80mg  - continue asa 81mg 

## 2013-04-11 NOTE — Assessment & Plan Note (Addendum)
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

## 2013-04-11 NOTE — Assessment & Plan Note (Signed)
Symptoms continuing, wet prep, gc/chlam negative at last visit - likely related to vaginal dryness from menopause - trial of premarin cream - f/u in 2-3 weeks

## 2013-04-11 NOTE — Patient Instructions (Signed)
Thank you for coming in today. I would like to increase your metformin to 1000mg  twice a day and start you on atorvastatin to lower your cholesterol.   For your vaginal itching I am giving you an estrogen cream to be used vaginally that I think will help.  Please come back to see me in 2-3 weeks.  Thank you!  Link to Wellness is a free program offered to [employer] that allows you to get your medications and case management for free. Print packet from: https //connects.http://baker.com/.aspx Call (228)789-2001, or pick up from [employer]

## 2013-04-28 ENCOUNTER — Encounter: Payer: Self-pay | Admitting: Family Medicine

## 2013-04-28 ENCOUNTER — Ambulatory Visit (INDEPENDENT_AMBULATORY_CARE_PROVIDER_SITE_OTHER): Payer: 59 | Admitting: Family Medicine

## 2013-04-28 VITALS — BP 134/84 | HR 85 | Temp 97.8°F | Ht 66.0 in | Wt 177.1 lb

## 2013-04-28 DIAGNOSIS — I10 Essential (primary) hypertension: Secondary | ICD-10-CM

## 2013-04-28 MED ORDER — GLUCOSE BLOOD VI STRP: ORAL_STRIP | Status: DC

## 2013-04-28 NOTE — Progress Notes (Signed)
Subjective:     Patient ID: Sally Mclaughlin, female   DOB: 1965-05-28, 47 y.o.   MRN: [id number]  HPI 47 yo AAF who presented to clinic today for follow-up.  1. HTN: Patient was hypertensive at last office visit. Reports no problems or concerns with her BP since last visit, though she doesn't regularly check it at home. She is still taking Lisinopril-HCTZ. 2. DM: Patient's last A1c was 14% last month. She reports that she is making appropriate changes in her diet and being more watchful of what she buys at the grocery store. She works 3rd shift and is able to walk at least 30 min each night during her break time. No symptomatic lows or highs in blood sugars since last office visit.  Review of Systems  Constitutional: Negative for fever, chills and appetite change.  HENT: Negative for sore throat.   Eyes: Negative for visual disturbance.  Respiratory: Negative for cough and shortness of breath.   Cardiovascular: Negative for chest pain and palpitations.  Gastrointestinal: Negative for nausea, vomiting and abdominal pain.  Genitourinary: Negative for dysuria.  Musculoskeletal: Negative for gait problem.  Neurological: Negative for dizziness, numbness and headaches.  All other systems reviewed and are negative.       Objective:   Physical Exam  Nursing note and vitals reviewed. Constitutional: She is oriented to person, place, and time. She appears well-developed and well-nourished. No distress.  HENT:  Head: Normocephalic and atraumatic.  Right Ear: External ear normal.  Left Ear: External ear normal.  Nose: Nose normal.  Eyes: Conjunctivae are normal. Right eye exhibits no discharge. Left eye exhibits no discharge. No scleral icterus.  Cardiovascular: Normal rate, regular rhythm and normal heart sounds.   No murmur heard. Pulmonary/Chest: Effort normal and breath sounds normal. No respiratory distress. She has no wheezes.  Abdominal: Soft. Bowel sounds are normal. She exhibits no  distension. There is no tenderness.  Musculoskeletal: Normal range of motion. She exhibits no edema and no tenderness.  Neurological: She is alert and oriented to person, place, and time.  Skin: Skin is warm and dry. No rash noted. She is not diaphoretic.  Psychiatric: She has a normal mood and affect.       Assessment:     47 yo AAF with history DM and HTN who presented to clinic today for follow-up.     Plan:     1. HTN: - BP better controlled at today's visit: 134/84 - Continue Lisinopril-HCTZ  2. DM: - Last A1c 14% - Patient making appropriate diet and exercise changes. - Will dispense glocose meter and test strips for once daily checking - Continue Metformin BID - Will recheck A1c in 2 months      I agree with the Medical Student's note above and edited it as necessary. I performed my own exam which is documented above.   Beverely Low, MD, MPH [facility] PGY-1 04/28/2013 3:38 PM

## 2013-04-28 NOTE — Patient Instructions (Signed)

## 2013-05-09 ENCOUNTER — Ambulatory Visit (HOSPITAL_COMMUNITY)
Admission: RE | Admit: 2013-05-09 | Discharge: 2013-05-09 | Disposition: A | Discharge status: Home / Self Care | Payer: 59 | Source: Ambulatory Visit | Attending: Family Medicine | Admitting: Family Medicine

## 2013-05-09 DIAGNOSIS — Z1231 Encounter for screening mammogram for malignant neoplasm of breast: Secondary | ICD-10-CM | POA: Insufficient documentation

## 2013-07-27 ENCOUNTER — Other Ambulatory Visit: Payer: Self-pay | Admitting: Family Medicine

## 2013-08-31 ENCOUNTER — Encounter: Payer: Self-pay | Admitting: Family Medicine

## 2013-08-31 ENCOUNTER — Ambulatory Visit (INDEPENDENT_AMBULATORY_CARE_PROVIDER_SITE_OTHER): Payer: 59 | Admitting: Family Medicine

## 2013-08-31 VITALS — BP 135/80 | HR 75 | Temp 98.5°F | Wt 173.0 lb

## 2013-08-31 DIAGNOSIS — I10 Essential (primary) hypertension: Secondary | ICD-10-CM

## 2013-08-31 DIAGNOSIS — M549 Dorsalgia, unspecified: Secondary | ICD-10-CM

## 2013-08-31 MED ORDER — KETOROLAC TROMETHAMINE 60 MG/2ML IM SOLN
60.0000 mg | Freq: Once | INTRAMUSCULAR | Status: AC
  Administered 2013-08-31: 60 mg via INTRAMUSCULAR

## 2013-08-31 MED ORDER — DICLOFENAC SODIUM 75 MG PO TBEC: 75.0000 mg | DELAYED_RELEASE_TABLET | Freq: Two times a day (BID) | ORAL | Status: DC

## 2013-08-31 MED ORDER — CYCLOBENZAPRINE HCL 5 MG PO TABS: 5.0000 mg | ORAL_TABLET | Freq: Three times a day (TID) | ORAL | Status: DC | PRN

## 2013-08-31 NOTE — Progress Notes (Signed)
Sally Mclaughlin is a 48 y.o. female who presents to [facility] today for SD appt for back pain.   Back pain: started 7 days ago. 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 Denies loss of urine/bowels, change in sensation of LE, falls, decreased strength. 400mg  Advil Q4 hrs w/o much benefit. Lower back w/o radiation. Worse w/ moving in certain positions. Heating pad w/ benefit   The following portions of the patient's history were reviewed and updated as appropriate: allergies, current medications, past medical history, family and social history, and problem list.  Patient is a nonsmoker.  No past medical history on file.  ROS as above otherwise neg.    Medications reviewed. Current Outpatient Prescriptions  Medication Sig Dispense Refill  . acetaminophen (TYLENOL) 500 MG tablet Take 500 mg by mouth every 6 (six) hours as needed.        Marland Kitchen aspirin 81 MG tablet Take 81 mg by mouth daily.      Marland Kitchen atorvastatin (LIPITOR) 80 MG tablet Take 1 tablet (80 mg total) by mouth daily.  90 tablet  3  . conjugated estrogens (PREMARIN) vaginal cream Place 1 Applicatorful vaginally daily.  42.5 g  12  . glucose blood (TRUETEST TEST) test strip Use as instructed  100 each  12  . lisinopril-hydrochlorothiazide (PRINZIDE,ZESTORETIC) 20-12.5 MG per tablet TAKE 1 TABLET BY MOUTH ONCE DAILY  30 tablet  PRN  . metFORMIN (GLUCOPHAGE) 1000 MG tablet Take 1 tablet by mouth one time daily in the morning for one week, then increase to 1 tablet twice daily.  60 tablet  11   No current facility-administered medications for this visit.    Exam:  BP 135/80  Pulse 75  Temp(Src) 98.5 F (36.9 C) (Oral)  Wt 173 lb (78.472 kg) Gen: Well NAD HEENT: EOMI,  MMM MSK: lumbar perispinal tightness. No bony ttp. Straight leg raise bilat w/o pain. FROM of back   No results found for this or any previous visit (from the past 72  hour(s)).  A/P (as seen in Problem list)  HYPERTENSION, BENIGN SYSTEMIC At goal. No change  Back pain Acute back pain likely from muscle spasm Toradol 60mg  in office Flexeril 5-10mg  TID PRN Voltaren BID starting tomorrow Precautions given and all questions answered No need for imaging at this time

## 2013-08-31 NOTE — Assessment & Plan Note (Signed)
Acute back pain likely from muscle spasm Toradol 60mg  in office Flexeril 5-10mg  TID PRN Voltaren BID starting tomorrow Precautions given and all questions answered No need for imaging at this time

## 2013-08-31 NOTE — Addendum Note (Signed)
Addended by: Valerie Roys on: 08/31/2013 10:37 AM   Modules accepted: Orders

## 2013-08-31 NOTE — Assessment & Plan Note (Signed)
At goal.  No change 

## 2013-08-31 NOTE — Patient Instructions (Signed)
Your back pain is likely from muscle spasm Please start flexeril for the spasms Please start the voltaren tomorrow morning after your shift. This is a strong antiinflammatory medication Please do daily stretching, heat applications, and massage to help your back  Muscle Cramps and Spasms Muscle cramps and spasms occur when a muscle or muscles tighten and you have no control over this tightening (involuntary muscle contraction). They are a common problem and can develop in any muscle. The most common place is in the calf muscles of the leg. Both muscle cramps and muscle spasms are involuntary muscle contractions, but they also have differences:   Muscle cramps are sporadic and painful. They may last a few seconds to a quarter of an hour. Muscle cramps are often more forceful and last longer than muscle spasms.  Muscle spasms may or may not be painful. They may also last just a few seconds or much longer. CAUSES  It is uncommon for cramps or spasms to be due to a serious underlying problem. In many cases, the cause of cramps or spasms is unknown. Some common causes are:   Overexertion.   Overuse from repetitive motions (doing the same thing over and over).   Remaining in a certain position for a long period of time.   Improper preparation, form, or technique while performing a sport or activity.   Dehydration.   Injury.   Side effects of some medicines.   Abnormally low levels of the salts and ions in your blood (electrolytes), especially potassium and calcium. This could happen if you are taking water pills (diuretics) or you are pregnant.  Some underlying medical problems can make it more likely to develop cramps or spasms. These include, but are not limited to:   Diabetes.   Parkinson disease.   Hormone disorders, such as thyroid problems.   Alcohol abuse.   Diseases specific to muscles, joints, and bones.   Blood vessel disease where not enough blood is getting  to the muscles.  HOME CARE INSTRUCTIONS   Stay well hydrated. Drink enough water and fluids to keep your urine clear or pale yellow.  It may be helpful to massage, stretch, and relax the affected muscle.  For tight or tense muscles, use a warm towel, heating pad, or hot shower water directed to the affected area.  If you are sore or have pain after a cramp or spasm, applying ice to the affected area may relieve discomfort.  Put ice in a plastic bag.  Place a towel between your skin and the bag.  Leave the ice on for 15-20 minutes, 03-04 times a day.  Medicines used to treat a known cause of cramps or spasms may help reduce their frequency or severity. Only take over-the-counter or prescription medicines as directed by your caregiver. SEEK MEDICAL CARE IF:  Your cramps or spasms get more severe, more frequent, or do not improve over time.  MAKE SURE YOU:   Understand these instructions.  Will watch your condition.  Will get help right away if you are not doing well or get worse. Document Released: 10/24/2001 Document Revised: 08/29/2012 Document Reviewed: 04/20/2012 [facility] Patient Information 2014 [place], [place].

## 2014-04-05 ENCOUNTER — Other Ambulatory Visit: Payer: Self-pay | Admitting: Family Medicine

## 2014-04-05 DIAGNOSIS — Z1231 Encounter for screening mammogram for malignant neoplasm of breast: Secondary | ICD-10-CM

## 2014-05-01 ENCOUNTER — Other Ambulatory Visit: Payer: Self-pay | Admitting: Family Medicine

## 2014-05-10 ENCOUNTER — Ambulatory Visit (HOSPITAL_COMMUNITY): Payer: 59

## 2014-05-21 ENCOUNTER — Encounter (HOSPITAL_BASED_OUTPATIENT_CLINIC_OR_DEPARTMENT_OTHER): Payer: Self-pay

## 2014-05-21 ENCOUNTER — Emergency Department (HOSPITAL_BASED_OUTPATIENT_CLINIC_OR_DEPARTMENT_OTHER)
Admission: EM | Admit: 2014-05-21 | Discharge: 2014-05-21 | Disposition: A | Discharge status: Home / Self Care | Payer: 59 | Attending: Emergency Medicine | Admitting: Emergency Medicine

## 2014-05-21 DIAGNOSIS — Z79899 Other long term (current) drug therapy: Secondary | ICD-10-CM | POA: Diagnosis not present

## 2014-05-21 DIAGNOSIS — I10 Essential (primary) hypertension: Secondary | ICD-10-CM | POA: Insufficient documentation

## 2014-05-21 DIAGNOSIS — L0291 Cutaneous abscess, unspecified: Secondary | ICD-10-CM

## 2014-05-21 DIAGNOSIS — Z791 Long term (current) use of non-steroidal anti-inflammatories (NSAID): Secondary | ICD-10-CM | POA: Insufficient documentation

## 2014-05-21 DIAGNOSIS — Z7982 Long term (current) use of aspirin: Secondary | ICD-10-CM | POA: Insufficient documentation

## 2014-05-21 DIAGNOSIS — E780 Pure hypercholesterolemia: Secondary | ICD-10-CM | POA: Diagnosis not present

## 2014-05-21 DIAGNOSIS — E119 Type 2 diabetes mellitus without complications: Secondary | ICD-10-CM | POA: Diagnosis not present

## 2014-05-21 DIAGNOSIS — L02215 Cutaneous abscess of perineum: Secondary | ICD-10-CM | POA: Insufficient documentation

## 2014-05-21 DIAGNOSIS — Z720 Tobacco use: Secondary | ICD-10-CM | POA: Diagnosis not present

## 2014-05-21 HISTORY — DX: Type 2 diabetes mellitus without complications: E11.9

## 2014-05-21 HISTORY — DX: Essential (primary) hypertension: I10

## 2014-05-21 HISTORY — DX: Pure hypercholesterolemia, unspecified: E78.00

## 2014-05-21 MED ORDER — LIDOCAINE-EPINEPHRINE 2 %-1:100000 IJ SOLN
INTRAMUSCULAR | Status: AC
Start: 2014-05-21 — End: 2014-05-21
  Administered 2014-05-21: 16:00:00
  Filled 2014-05-21: qty 1

## 2014-05-21 MED ORDER — SULFAMETHOXAZOLE-TRIMETHOPRIM 800-160 MG PO TABS: 1.0000 | ORAL_TABLET | Freq: Two times a day (BID) | ORAL | Status: DC

## 2014-05-21 NOTE — ED Provider Notes (Signed)
CSN: [id number]     Arrival date & time 05/21/14  1446 History  This chart was scribed for Debby Freiberg, MD by Martinique Peace, ED Scribe. The patient was seen in [place]. The patient's care was started at 3:44 PM.     Chief Complaint  Patient presents with  . Abscess      Patient is a 49 y.o. female presenting with abscess. The history is provided by the patient. No language interpreter was used.  Abscess Location:  Leg Leg abscess location: left inner groin. Abscess quality: draining, painful and redness   Duration:  1 week Progression:  Worsening Chronicity:  New Context: diabetes   Ineffective treatments:  None tried Associated symptoms: no fever, no nausea and no vomiting     HPI Comments: Sally Mclaughlin is a 49 y.o. female who presents to the Emergency Department complaining of abscess onset 1 week ago to left inner groin area. Pt notes draining from affected site. No complaints of nausea, fever, or vomiting. She denies history of similar incidents in the past. History of hypertension and DM. Pt is light tobacco smoker.    Past Medical History  Diagnosis Date  . Hypercholesteremia   . Hypertension   . Diabetes mellitus without complication    History reviewed. No pertinent past surgical history. No family history on file. History  Substance Use Topics  . Smoking status: Light Tobacco Smoker    Types: Cigarettes  . Smokeless tobacco: Not on file  . Alcohol Use: No   OB History    No data available     Review of Systems  Constitutional: Negative for fever.  Gastrointestinal: Negative for nausea and vomiting.  Skin: Positive for wound.       Abscess.   All other systems reviewed and are negative.     Allergies  Review of patient's allergies indicates no known allergies.  Home Medications   Prior to Admission medications   Medication Sig Start Date End Date Taking? Authorizing Provider  acetaminophen (TYLENOL) 500 MG tablet Take 500 mg by mouth every 6  (six) hours as needed.      Historical Provider, MD  aspirin 81 MG tablet Take 81 mg by mouth daily.    Historical Provider, MD  atorvastatin (LIPITOR) 80 MG tablet TAKE 1 TABLET BY MOUTH DAILY. 05/01/14   Frazier Richards, MD  conjugated estrogens (PREMARIN) vaginal cream Place 1 Applicatorful vaginally daily. 04/11/13   Frazier Richards, MD  cyclobenzaprine (FLEXERIL) 5 MG tablet Take 1-2 tablets (5-10 mg total) by mouth 3 (three) times daily as needed for muscle spasms. 08/31/13   Waldemar Dickens, MD  diclofenac (VOLTAREN) 75 MG EC tablet Take 1 tablet (75 mg total) by mouth 2 (two) times daily. Start on 09/01/13 08/31/13   Waldemar Dickens, MD  glucose blood (TRUETEST TEST) test strip Use as instructed 04/28/13   Frazier Richards, MD  lisinopril-hydrochlorothiazide (PRINZIDE,ZESTORETIC) 20-12.5 MG per tablet TAKE 1 TABLET BY MOUTH ONCE DAILY 07/27/13   Frazier Richards, MD  metFORMIN (GLUCOPHAGE) 1000 MG tablet TAKE 1 TABLET BY MOUTH ONE TIME DAILY IN THE MORNING FOR ONE WEEK, THEN INCREASE TO 1 TABLET TWICE DAILY. 05/01/14   Frazier Richards, MD  sulfamethoxazole-trimethoprim (SEPTRA DS) 800-160 MG per tablet Take 1 tablet by mouth every 12 (twelve) hours. 05/21/14   Debby Freiberg, MD   BP 153/83 mmHg  Pulse 81  Temp(Src) 98.1 F (36.7 C) (Oral)  Resp 18  Ht 5\' 6"  (1.676  m)  Wt 172 lb (78.019 kg)  BMI 27.77 kg/m2  SpO2 100% Physical Exam  Constitutional: She is oriented to person, place, and time. She appears well-developed and well-nourished.  HENT:  Head: Normocephalic and atraumatic.  Right Ear: External ear normal.  Left Ear: External ear normal.  Eyes: Conjunctivae and EOM are normal. Pupils are equal, round, and reactive to light.  Neck: Normal range of motion. Neck supple.  Cardiovascular: Normal rate, regular rhythm, normal heart sounds and intact distal pulses.   Pulmonary/Chest: Effort normal and breath sounds normal.  Abdominal: Soft. Bowel sounds are normal. There is no tenderness.   Musculoskeletal: Normal range of motion.  Neurological: She is alert and oriented to person, place, and time.  Skin: Skin is warm and dry.  4 cm abscess iver L superior mons pubis  Vitals reviewed.   ED Course  INCISION AND DRAINAGE Date/Time: 05/21/2014 4:06 PM Performed by: Debby Freiberg Authorized by: Debby Freiberg Consent: Verbal consent obtained. Type: abscess Body area: anogenital (mons pubis) Anesthesia: local infiltration Local anesthetic: lidocaine 1% with epinephrine Anesthetic total: 10 ml Scalpel size: 11 Incision type: single straight Complexity: simple Drainage: purulent Drainage amount: moderate Wound treatment: wound left open Patient tolerance: Patient tolerated the procedure well with no immediate complications   (including critical care time) Labs Review Labs Reviewed  CULTURE, ROUTINE-ABSCESS    Imaging Review No results found.   EKG Interpretation None     Medications  lidocaine-EPINEPHrine (XYLOCAINE W/EPI) 2 %-1:100000 (with pres) injection (  Given 05/21/14 1548)    3:46 PM- Treatment plan was discussed with patient who verbalizes understanding and agrees.   MDM   Final diagnoses:  Abscess    49 y.o. female with pertinent PMH of DM presents with abscess as above.  Drained uneventfully.  Placed on bactrim for overlying cellulitis and DM history.  DC home with standard return precautions.    I have reviewed all laboratory and imaging studies if ordered as above  1. Abscess       I personally performed the services described in this documentation, which was scribed in my presence. The recorded information has been reviewed and is accurate.   Debby Freiberg, MD 05/21/14 [phone]

## 2014-05-21 NOTE — Discharge Instructions (Signed)

## 2014-05-21 NOTE — ED Notes (Signed)
Pt reports abscess to L inner groin area.  Has been draining.

## 2014-05-21 NOTE — ED Notes (Signed)
MD at bedside. 

## 2014-05-22 ENCOUNTER — Ambulatory Visit (HOSPITAL_COMMUNITY)
Admission: RE | Admit: 2014-05-22 | Discharge: 2014-05-22 | Disposition: A | Discharge status: Home / Self Care | Payer: 59 | Source: Ambulatory Visit | Attending: Family Medicine | Admitting: Family Medicine

## 2014-05-22 DIAGNOSIS — Z1231 Encounter for screening mammogram for malignant neoplasm of breast: Secondary | ICD-10-CM | POA: Diagnosis present

## 2014-05-23 ENCOUNTER — Telehealth (HOSPITAL_BASED_OUTPATIENT_CLINIC_OR_DEPARTMENT_OTHER): Payer: Self-pay | Admitting: Emergency Medicine

## 2014-05-23 NOTE — Progress Notes (Signed)
ED Antimicrobial Stewardship Positive Culture Follow Up   Sally Mclaughlin is an 49 y.o. female who presented to [facility] on 05/21/2014 with a chief complaint of  Chief Complaint  Patient presents with  . Abscess    Recent Results (from the past 720 hour(s))  Culture, routine-abscess     Status: None (Preliminary result)   Collection Time: 05/21/14  4:05 PM  Result Value Ref Range Status   Specimen Description ABSCESS MIDLEFT GROIN  Final   Special Requests Normal  Final   Gram Stain   Final    FEW WBC PRESENT,BOTH PMN AND MONONUCLEAR NO SQUAMOUS EPITHELIAL CELLS SEEN FEW GRAM POSITIVE COCCI IN PAIRS IN CHAINS IN CLUSTERS Performed at [facility]    Culture   Final    ABUNDANT STAPHYLOCOCCUS AUREUS Note: RIFAMPIN AND GENTAMICIN SHOULD NOT BE USED AS SINGLE DRUGS FOR TREATMENT OF STAPH INFECTIONS. ABUNDANT GROUP B STREP(S.AGALACTIAE)ISOLATED Note: TESTING AGAINST S. AGALACTIAE NOT ROUTINELY PERFORMED DUE TO PREDICTABILITY OF AMP/PEN/VAN SUSCEPTIBILITY. Performed at [facility]    Report Status PENDING  Incomplete    [x]  Treated with Bactrim, organism resistant to prescribed antimicrobial []  Patient discharged originally without antimicrobial agent and treatment is now indicated  New antibiotic prescription: cephalexin 500mg  BID x 7 days.  Continue Bactrim as prescribed.  ED Provider: Noland Fordyce, PA-C   [name] 05/23/2014, 9:00 AM Infectious Diseases Pharmacist Phone# [phone]

## 2014-05-24 LAB — CULTURE, ROUTINE-ABSCESS: SPECIAL REQUESTS: NORMAL

## 2014-05-25 ENCOUNTER — Telehealth (HOSPITAL_COMMUNITY): Payer: Self-pay

## 2014-05-25 NOTE — Telephone Encounter (Signed)
Pt returned call.  Pt informed of Dx and need for addl tx.  Rx for Cephalexin called into [facility] outpt pharm and given to RPh.

## 2014-05-25 NOTE — Telephone Encounter (Signed)
LVM requesting call back.

## 2014-08-02 ENCOUNTER — Other Ambulatory Visit: Payer: Self-pay | Admitting: Family Medicine

## 2014-08-02 DIAGNOSIS — I10 Essential (primary) hypertension: Secondary | ICD-10-CM

## 2014-08-03 ENCOUNTER — Telehealth: Payer: Self-pay | Admitting: *Deleted

## 2014-08-03 ENCOUNTER — Other Ambulatory Visit: Payer: Self-pay | Admitting: Family Medicine

## 2014-08-03 NOTE — Telephone Encounter (Signed)
LMOVM for pt to call back and schedule appt for diabetes. Deseree Kennon Holter, CMA

## 2014-09-06 ENCOUNTER — Encounter: Payer: Self-pay | Admitting: Family Medicine

## 2014-09-06 ENCOUNTER — Ambulatory Visit (INDEPENDENT_AMBULATORY_CARE_PROVIDER_SITE_OTHER): Payer: 59 | Admitting: Family Medicine

## 2014-09-06 VITALS — BP 128/82 | HR 84 | Temp 98.3°F | Ht 66.0 in | Wt 170.0 lb

## 2014-09-06 DIAGNOSIS — F17200 Nicotine dependence, unspecified, uncomplicated: Secondary | ICD-10-CM

## 2014-09-06 DIAGNOSIS — IMO0002 Reserved for concepts with insufficient information to code with codable children: Secondary | ICD-10-CM

## 2014-09-06 DIAGNOSIS — E1165 Type 2 diabetes mellitus with hyperglycemia: Secondary | ICD-10-CM | POA: Diagnosis not present

## 2014-09-06 DIAGNOSIS — Z720 Tobacco use: Secondary | ICD-10-CM

## 2014-09-06 LAB — POCT GLYCOSYLATED HEMOGLOBIN (HGB A1C): Hemoglobin A1C: 13.9

## 2014-09-06 MED ORDER — GLIPIZIDE 5 MG PO TABS: 5.0000 mg | ORAL_TABLET | Freq: Every day | ORAL | Status: DC

## 2014-09-06 NOTE — Patient Instructions (Signed)
I am adding another medication to help control your blood sugar. It is called glipizide. I want you to start taking 1 tab daily and go up slowly.  Week 1: Take 1 tab daily  Week 2: Take 1 tab twice a day Week 3-12: Take 2 tabs twice a day  After 3 months come back and see me so that we can check how your sugar is doing.

## 2014-09-06 NOTE — Assessment & Plan Note (Addendum)
Uncontrolled at 13.9 on metformin 1g bid - continue metformin - add glippizide and titrate up weekly to 10mg  bid - f/u in 2 months prior to anticipated move to [facility] - stressed the importance of having insurance and primary care set up before moving - it is still likely she will need insulin and I have warned her of this but she is very afraid of needles and we will try to maximize orals first.

## 2014-09-06 NOTE — Assessment & Plan Note (Signed)
Reports she has started smoking again. About 1 pack/week. Helps her sleeps.  - Wants to quit again but not motivated to do this right now, stress level is high with upcoming move - Encouraged her to keep use minimal and think about when she might be ready to quit again, patient agrees

## 2014-09-06 NOTE — Progress Notes (Signed)
   Subjective:    Patient ID: Sally Mclaughlin, female    DOB: 1965/08/25, 49 y.o.   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 She appears well-developed and well-nourished. No distress.  HENT:  Head: Normocephalic and atraumatic.  Eyes: Conjunctivae are normal. Right eye exhibits no discharge. Left eye exhibits no discharge. No scleral icterus.  Cardiovascular: Normal rate, regular rhythm and normal heart sounds.   No murmur heard. Pulmonary/Chest: Effort normal and breath sounds normal. No respiratory distress. She has no wheezes.  Abdominal: Soft. She exhibits no distension. There is no tenderness.  Neurological: She is alert and oriented to person, place, and time.  Skin: Skin is warm and dry. No rash noted. She is not diaphoretic.  Psychiatric:  Somewhat pressured and tangential speech. Reports being very lonely and needing to talk. Appears more calm after expressing her worries to me.  Nursing note and vitals reviewed.         Assessment & Plan:

## 2014-09-07 ENCOUNTER — Telehealth: Payer: Self-pay | Admitting: *Deleted

## 2014-09-07 NOTE — Telephone Encounter (Signed)
Caryl Pina with [facility] call needing clarification in patient's glipizide direction.  Please give them a call at [phone].  Derl Barrow, RN

## 2014-12-13 ENCOUNTER — Other Ambulatory Visit: Payer: Self-pay | Admitting: *Deleted

## 2014-12-13 DIAGNOSIS — E1165 Type 2 diabetes mellitus with hyperglycemia: Secondary | ICD-10-CM

## 2014-12-13 DIAGNOSIS — IMO0002 Reserved for concepts with insufficient information to code with codable children: Secondary | ICD-10-CM

## 2014-12-13 MED ORDER — GLIPIZIDE 10 MG PO TABS: 10.0000 mg | ORAL_TABLET | Freq: Two times a day (BID) | ORAL | Status: DC

## 2015-05-09 ENCOUNTER — Other Ambulatory Visit: Payer: Self-pay

## 2015-05-09 DIAGNOSIS — Z1231 Encounter for screening mammogram for malignant neoplasm of breast: Secondary | ICD-10-CM

## 2015-05-20 ENCOUNTER — Other Ambulatory Visit: Payer: Self-pay | Admitting: Family Medicine

## 2015-05-20 DIAGNOSIS — E1140 Type 2 diabetes mellitus with diabetic neuropathy, unspecified: Secondary | ICD-10-CM

## 2015-05-21 MED FILL — metFORMIN HCL 1000 MG TABS: 1000 | 30 days supply | Qty: 60 | Fill #0

## 2015-05-29 ENCOUNTER — Ambulatory Visit
Admission: RE | Admit: 2015-05-29 | Discharge: 2015-05-29 | Disposition: A | Discharge status: Home / Self Care | Payer: 59 | Source: Ambulatory Visit

## 2015-05-29 DIAGNOSIS — Z1231 Encounter for screening mammogram for malignant neoplasm of breast: Secondary | ICD-10-CM | POA: Diagnosis not present

## 2015-06-28 ENCOUNTER — Other Ambulatory Visit: Payer: Self-pay | Admitting: Family Medicine

## 2015-06-28 DIAGNOSIS — E785 Hyperlipidemia, unspecified: Secondary | ICD-10-CM

## 2015-06-28 MED FILL — ATORVASTATIN 80 MG TABLET: 80 | 90 days supply | Qty: 90 | Fill #0

## 2015-06-28 MED FILL — metFORMIN HCL 1000 MG TABS: 1000 | 30 days supply | Qty: 60 | Fill #1

## 2015-06-28 MED FILL — LISINOPRIL-HCTZ 20-12.5 MG: 20-12.5 | 90 days supply | Qty: 90 | Fill #3

## 2015-08-27 MED FILL — metFORMIN HCL 1000 MG TABS: 1000 | 30 days supply | Qty: 60 | Fill #2

## 2015-10-29 ENCOUNTER — Other Ambulatory Visit: Payer: Self-pay | Admitting: *Deleted

## 2015-10-29 DIAGNOSIS — I10 Essential (primary) hypertension: Secondary | ICD-10-CM

## 2015-10-29 MED FILL — ATORVASTATIN 80 MG TABLET: 80 | 90 days supply | Qty: 90 | Fill #1

## 2015-10-29 MED FILL — metFORMIN HCL 1000 MG TABS: 1000 | 30 days supply | Qty: 60 | Fill #3

## 2015-10-30 MED ORDER — LISINOPRIL-HYDROCHLOROTHIAZIDE 20-12.5 MG PO TABS: 1.0000 | ORAL_TABLET | Freq: Every day | ORAL | Status: DC

## 2015-10-30 MED FILL — LISINOPRIL-HCTZ 20-12.5 MG: 20-12.5 | 90 days supply | Qty: 90 | Fill #0

## 2015-11-12 ENCOUNTER — Encounter: Payer: Self-pay | Admitting: Family Medicine

## 2015-11-12 ENCOUNTER — Ambulatory Visit (INDEPENDENT_AMBULATORY_CARE_PROVIDER_SITE_OTHER): Payer: 59 | Admitting: Family Medicine

## 2015-11-12 VITALS — BP 134/70 | HR 79 | Temp 98.2°F | Ht 66.0 in | Wt 174.0 lb

## 2015-11-12 DIAGNOSIS — E1100 Type 2 diabetes mellitus with hyperosmolarity without nonketotic hyperglycemic-hyperosmolar coma (NKHHC): Secondary | ICD-10-CM

## 2015-11-12 DIAGNOSIS — I10 Essential (primary) hypertension: Secondary | ICD-10-CM

## 2015-11-12 DIAGNOSIS — E1140 Type 2 diabetes mellitus with diabetic neuropathy, unspecified: Secondary | ICD-10-CM | POA: Diagnosis not present

## 2015-11-12 DIAGNOSIS — Z23 Encounter for immunization: Secondary | ICD-10-CM

## 2015-11-12 DIAGNOSIS — IMO0001 Reserved for inherently not codable concepts without codable children: Secondary | ICD-10-CM

## 2015-11-12 DIAGNOSIS — G5601 Carpal tunnel syndrome, right upper limb: Secondary | ICD-10-CM

## 2015-11-12 DIAGNOSIS — Z113 Encounter for screening for infections with a predominantly sexual mode of transmission: Secondary | ICD-10-CM

## 2015-11-12 DIAGNOSIS — G5600 Carpal tunnel syndrome, unspecified upper limb: Secondary | ICD-10-CM | POA: Insufficient documentation

## 2015-11-12 DIAGNOSIS — E1165 Type 2 diabetes mellitus with hyperglycemia: Secondary | ICD-10-CM | POA: Diagnosis not present

## 2015-11-12 DIAGNOSIS — E785 Hyperlipidemia, unspecified: Secondary | ICD-10-CM | POA: Diagnosis not present

## 2015-11-12 LAB — COMPLETE METABOLIC PANEL WITH GFR
ALT: 22 U/L (ref 6–29)
AST: 20 U/L (ref 10–35)
Albumin: 4.2 g/dL (ref 3.6–5.1)
Alkaline Phosphatase: 86 U/L (ref 33–130)
BILIRUBIN TOTAL: 0.9 mg/dL (ref 0.2–1.2)
BUN: 13 mg/dL (ref 7–25)
CHLORIDE: 98 mmol/L (ref 98–110)
CO2: 27 mmol/L (ref 20–31)
CREATININE: 0.92 mg/dL (ref 0.50–1.05)
Calcium: 9.6 mg/dL (ref 8.6–10.4)
GFR, EST AFRICAN AMERICAN: 83 mL/min (ref >=60)
GFR, Est Non African American: 72 mL/min (ref >=60)
GLUCOSE: 374 mg/dL — AB (ref 65–99)
Potassium: 3.6 mmol/L (ref 3.5–5.3)
SODIUM: 135 mmol/L (ref 135–146)
TOTAL PROTEIN: 7.2 g/dL (ref 6.1–8.1)

## 2015-11-12 LAB — LIPID PANEL
Cholesterol: 98 mg/dL — ABNORMAL LOW (ref 125–200)
HDL: 55 mg/dL (ref >=46)
LDL CALC: 21 mg/dL (ref <130)
TRIGLYCERIDES: 109 mg/dL (ref <150)
Total CHOL/HDL Ratio: 1.8 Ratio (ref <=5.0)
VLDL: 22 mg/dL (ref <30)

## 2015-11-12 LAB — CBC
HCT: 33.3 % — ABNORMAL LOW (ref 35.0–45.0)
Hemoglobin: 11.4 g/dL — ABNORMAL LOW (ref 11.7–15.5)
MCH: 30.5 pg (ref 27.0–33.0)
MCHC: 34.2 g/dL (ref 32.0–36.0)
MCV: 89 fL (ref 80.0–100.0)
MPV: 9.2 fL (ref 7.5–12.5)
PLATELETS: 256 10*3/uL (ref 140–400)
RBC: 3.74 MIL/uL — AB (ref 3.80–5.10)
RDW: 13.3 % (ref 11.0–15.0)
WBC: 5.5 10*3/uL (ref 3.8–10.8)

## 2015-11-12 LAB — POCT GLYCOSYLATED HEMOGLOBIN (HGB A1C): Hemoglobin A1C: 10.4

## 2015-11-12 MED ORDER — GLIPIZIDE 10 MG PO TABS: 10.0000 mg | ORAL_TABLET | Freq: Two times a day (BID) | ORAL | Status: DC

## 2015-11-12 MED ORDER — ATORVASTATIN CALCIUM 80 MG PO TABS: 80.0000 mg | ORAL_TABLET | Freq: Every day | ORAL | Status: DC

## 2015-11-12 MED ORDER — WRIST SPLINT/COCK-UP/RIGHT M MISC: 1.0000 | Freq: Every day | Status: DC

## 2015-11-12 MED ORDER — GABAPENTIN 100 MG PO CAPS: 100.0000 mg | ORAL_CAPSULE | Freq: Three times a day (TID) | ORAL | Status: DC | PRN

## 2015-11-12 MED ORDER — METFORMIN HCL 1000 MG PO TABS: 1000.0000 mg | ORAL_TABLET | Freq: Two times a day (BID) | ORAL | Status: DC

## 2015-11-12 MED ORDER — LISINOPRIL-HYDROCHLOROTHIAZIDE 20-12.5 MG PO TABS: 1.0000 | ORAL_TABLET | Freq: Every day | ORAL | Status: DC

## 2015-11-12 MED FILL — GABAPENTIN 100 MG CAPSULE: 100 | 30 days supply | Qty: 90 | Fill #0

## 2015-11-12 MED FILL — glipiZIDE 10 MG TABS: 10 | 30 days supply | Qty: 60 | Fill #0

## 2015-11-12 NOTE — Assessment & Plan Note (Addendum)
A1c improved from 13 to 10 but still far from goal, pt taking metformin only, refuses injections - restart glipizide, continue metformin - discussed hypoglycemia recognition and management, patient has a good grasp of this - discussed cutting juice from diet except for hypoglycemic management, pt currently drinking lots because free at work - f/u in 1 month with Koval to discuss options to improve glucose control, hopeful that pt will reconsider injectables

## 2015-11-12 NOTE — Assessment & Plan Note (Signed)
Tingling/numbness in first 3 digits on right hand, worst after work when trying to sleep, positive tinnels - cock-up wrist splint and gabapentin - f/u in 3 months

## 2015-11-13 LAB — HEPATITIS C ANTIBODY: HCV AB: NEGATIVE

## 2015-11-15 NOTE — Progress Notes (Signed)
Subjective:   Sally Mclaughlin is a 50 y.o. female with a history of dm, htn here for dm and hand numbness  CHRONIC DIABETES  Disease Monitoring  Blood Sugar Ranges: does not check, A1c was 13 last visit, now down to 10  Polyuria: no   Visual problems: no   Medication Compliance: no, taking metformin only  Medication Side Effects  Hypoglycemia: yes, drinks juice when she feels low but doesn't check   Preventitive Health Care  Eye Exam: due, pt to schedule  Foot Exam: due  Diet pattern: snacks all night, 1 year meal per day, works third shift in hospital  Exercise: walks a lot at work    Review of Systems:  Per HPI. All other systems reviewed and are negative.   PMH, PSH, Medications, Allergies, and FmHx reviewed and updated in EMR.  Social History: current smoker  Objective:  BP 134/70 mmHg  Pulse 79  Temp(Src) 98.2 F (36.8 C) (Oral)  Ht 5\' 6"  (1.676 m)  Wt 174 lb (78.926 kg)  BMI 28.10 kg/m2  LMP 11/25/2012  GEN: WDWN, NAD, Non-toxic, Alert & Oriented x 3 HEENT: Atraumatic, Normocephalic.  Ears and Nose: No external deformity. EXTR: No clubbing/cyanosis/edema NEURO: Normal gait.  PSYCH: Normally interactive. Conversant. Not depressed or anxious appearing.  Calm demeanor.   Right hand Ecchymosis or edema: neg ROM wrist/hand/digits: full  Carpals, MCP's, digits: NT Distal Ulna and Radius: NT Ecchymosis or edema: neg No instability Cysts/nodules: neg Snuffbox tenderness: neg Scaphoid tubercle: NT Resisted supination: NT Full composite fist, no malrotation Grip, all digits: 5/5 str DIPJT: NT PIP JT: NT MCP JT: NT No tenosynovitis Tinel's: positive Atrophy: neg  Hand sensation: intact      Chemistry      Component Value Date/Time   NA 135 11/12/2015 1026   K 3.6 11/12/2015 1026   CL 98 11/12/2015 1026   CO2 27 11/12/2015 1026   BUN 13 11/12/2015 1026   CREATININE 0.92 11/12/2015 1026   CREATININE 0.87 10/09/2008 2105      Component Value  Date/Time   CALCIUM 9.6 11/12/2015 1026   ALKPHOS 86 11/12/2015 1026   AST 20 11/12/2015 1026   ALT 22 11/12/2015 1026   BILITOT 0.9 11/12/2015 1026      Lab Results  Component Value Date   WBC 5.5 11/12/2015   HGB 11.4* 11/12/2015   HCT 33.3* 11/12/2015   MCV 89.0 11/12/2015   PLT 256 11/12/2015   Lab Results  Component Value Date   TSH 1.540 03/28/2013   Lab Results  Component Value Date   HGBA1C 10.4 11/12/2015   Assessment & Plan:     Sally Mclaughlin is a 51 y.o. female here for dm f/u and hand numbness  Diabetes mellitus type 2, uncontrolled A1c improved from 13 to 10 but still far from goal, pt taking metformin only, refuses injections - restart glipizide, continue metformin - discussed hypoglycemia recognition and management, patient has a good grasp of this - discussed cutting juice from diet except for hypoglycemic management, pt currently drinking lots because free at work - f/u in 1 month with Koval to discuss options to improve glucose control, hopeful that pt will reconsider injectables  Carpal tunnel syndrome Tingling/numbness in first 3 digits on right hand, worst after work when trying to sleep, positive tinnels - cock-up wrist splint and gabapentin - f/u in 3 months  We discussed the anatomy involved, and that carpal tunnel syndrome primarily involves the median nerve, and  this typically affects digits one through 3. We also discussed that mild cases of carpal tunnel syndrome are often improved with night splints, and it is very reasonable to consider a carpal tunnel injection. If the patient does have moderate to severe carpal tunnel syndrome based on NCV, then it is certainly reasonable to consider carpal tunnel release, which was discussed with the patient. We also discussed his severe carpal tunnel syndrome can lead to permanent nerve impairment even if released. At this point, the patient would like to proceed conservatively.   Beverlyn Roux, MD,  [facility] PGY-3 11/15/2015 2:54 PM

## 2015-11-15 NOTE — Progress Notes (Signed)
Quick Note:  Please inform pt of normal lab results except for high blood sugar. Follow-up for her diabetes as we discussed. Thanks! ______

## 2015-12-12 ENCOUNTER — Ambulatory Visit: Payer: 59 | Admitting: Pharmacist

## 2015-12-30 MED FILL — metFORMIN HCL 1000 MG TABS: 1000 | 30 days supply | Qty: 60 | Fill #4

## 2015-12-30 MED FILL — GABAPENTIN 100 MG CAPSULE: 100 | 30 days supply | Qty: 90 | Fill #1

## 2015-12-30 MED FILL — glipiZIDE 10 MG TABS: 10 | 30 days supply | Qty: 60 | Fill #1

## 2016-01-29 MED FILL — ATORVASTATIN 80 MG TABLET: 80 | 90 days supply | Qty: 90 | Fill #2

## 2016-01-29 MED FILL — LISINOPRIL-HCTZ 20-12.5 MG: 20-12.5 | 90 days supply | Qty: 90 | Fill #1

## 2016-03-04 MED FILL — metFORMIN HCL 1000 MG TABS: 1000 | 30 days supply | Qty: 60 | Fill #5

## 2016-03-04 MED FILL — glipiZIDE 10 MG TABS: 10 | 30 days supply | Qty: 60 | Fill #2

## 2016-04-20 MED FILL — glipiZIDE 10 MG TABS: 10 | 30 days supply | Qty: 60 | Fill #3

## 2016-04-20 MED FILL — GABAPENTIN 100 MG CAPSULE: 100 | 30 days supply | Qty: 90 | Fill #2

## 2016-04-20 MED FILL — metFORMIN HCL 1000 MG TABS: 1000 | 30 days supply | Qty: 60 | Fill #6

## 2016-04-20 MED FILL — LISINOPRIL-HCTZ 20-12.5 MG: 20-12.5 | 90 days supply | Qty: 90 | Fill #2

## 2016-04-20 MED FILL — ATORVASTATIN 80 MG TABLET: 80 | 90 days supply | Qty: 90 | Fill #3

## 2016-06-15 ENCOUNTER — Telehealth: Payer: Self-pay | Admitting: Family Medicine

## 2016-06-15 NOTE — Telephone Encounter (Signed)
No answer, left voicemail asking for call back and possible appointment scheduling. Patient has overdue HM (A1C, etc.)

## 2016-06-23 MED FILL — GABAPENTIN 100 MG CAPSULE: 100 | 30 days supply | Qty: 90 | Fill #3

## 2016-06-23 MED FILL — metFORMIN HCL 1000 MG TABS: 1000 | 30 days supply | Qty: 60 | Fill #0

## 2016-07-14 ENCOUNTER — Other Ambulatory Visit: Payer: Self-pay | Admitting: Family Medicine

## 2016-07-14 DIAGNOSIS — Z1231 Encounter for screening mammogram for malignant neoplasm of breast: Secondary | ICD-10-CM

## 2016-08-04 ENCOUNTER — Ambulatory Visit
Admission: RE | Admit: 2016-08-04 | Discharge: 2016-08-04 | Disposition: A | Discharge status: Home / Self Care | Payer: 59 | Source: Ambulatory Visit | Attending: Family Medicine | Admitting: Family Medicine

## 2016-08-04 DIAGNOSIS — Z1231 Encounter for screening mammogram for malignant neoplasm of breast: Secondary | ICD-10-CM | POA: Diagnosis not present

## 2016-08-14 MED FILL — metFORMIN HCL 1000 MG TABS: 1000 | 30 days supply | Qty: 60 | Fill #1

## 2016-08-14 MED FILL — ATORVASTATIN 80 MG TABLET: 80 | 90 days supply | Qty: 90 | Fill #0

## 2016-08-14 MED FILL — glipiZIDE 10 MG TABS: 10 | 30 days supply | Qty: 60 | Fill #4

## 2016-08-14 MED FILL — LISINOPRIL-HCTZ 20-12.5 MG: 20-12.5 | 90 days supply | Qty: 90 | Fill #3

## 2016-08-17 ENCOUNTER — Encounter: Payer: 59 | Admitting: Family Medicine

## 2016-08-17 ENCOUNTER — Other Ambulatory Visit: Payer: Self-pay | Admitting: *Deleted

## 2016-08-17 DIAGNOSIS — G5601 Carpal tunnel syndrome, right upper limb: Secondary | ICD-10-CM

## 2016-08-18 MED ORDER — GABAPENTIN 100 MG PO CAPS: 100.0000 mg | ORAL_CAPSULE | Freq: Three times a day (TID) | ORAL | 0 refills | Status: DC | PRN

## 2016-08-18 NOTE — Telephone Encounter (Signed)
Patient overdue on a LOT of health maintenance items. Also She recently cancelled/No showed an appointment with me.   I have refilled Gabapentin for 1 month. No additional refills to be provided until she is seen.

## 2016-08-19 NOTE — Telephone Encounter (Signed)
Patient scheduled for CPE with PCP for 4/10.

## 2016-08-24 ENCOUNTER — Telehealth: Payer: Self-pay | Admitting: Family Medicine

## 2016-08-24 NOTE — Telephone Encounter (Signed)
Called to confirm next-day appointment. No answer, could not LMOVM.

## 2016-08-25 ENCOUNTER — Encounter: Payer: 59 | Admitting: Family Medicine

## 2016-09-21 MED FILL — GABAPENTIN 100 MG CAPSULE: 100 | 30 days supply | Qty: 90 | Fill #0

## 2016-10-15 ENCOUNTER — Other Ambulatory Visit: Payer: Self-pay | Admitting: Family Medicine

## 2016-10-15 DIAGNOSIS — G5601 Carpal tunnel syndrome, right upper limb: Secondary | ICD-10-CM

## 2016-10-15 MED FILL — glipiZIDE 10 MG TABS: 10 | 30 days supply | Qty: 60 | Fill #5

## 2016-10-15 MED FILL — metFORMIN HCL 1000 MG TABS: 1000 | 30 days supply | Qty: 60 | Fill #2

## 2016-12-17 NOTE — Progress Notes (Signed)
    Subjective:  Sally Mclaughlin is a 51 y.o. female who presents to the [facility] today for diabetes follow-up  HPI:  Diabetes mellitus, Type 2 Disease Monitoring Blood Sugar Ranges: does not take Polyuria: increased Visual problems: no (3-4 years since last visit)  Urine Microalbumin checking today  Last A1C: 10.4 6/17> 8.5 12/18/16  Medication Compliance: yes  Medication Side Effects Hypoglycemia: no   Preventitive Health Care Eye Exam: due Foot Exam: Today  Skin infection: Patient noticed redness and swelling on her abdomen inferior to her umbilicus for the past two weeks. No known trauma or insect bite. No known history of MRSA. Did have 1 prior skin infection in her right middle inguinal area that required incision and drainage in January 2016. Denies any significant pain, fevers or chills. Did have some drainage after expressing area the other day.    Objective:  Physical Exam: BP (!) 148/80 (BP Location: Left Arm, Patient Position: Sitting, Cuff Size: Large)   Pulse 82   Temp 97.8 F (36.6 C) (Oral)   Ht 5\' 6"  (1.676 m)   Wt 176 lb 12.8 oz (80.2 kg)   LMP 11/25/2012   SpO2 97%   BMI 28.54 kg/m   Gen: 51 yo F in NAD, resting comfortably CV: RRR with no murmurs appreciated Pulm: NWOB, CTAB with no crackles, wheezes, or rhonchi GI: Normal bowel sounds present. Soft, Nontender, Nondistended. MSK: no edema, cyanosis, or clubbing noted Skin: warm, dry, 2.5x2.5cm area of erythema and warmth with induration and minimal confluence Neuro: grossly normal, moves all extremities Psych: Normal affect and thought content  Results for orders placed or performed in visit on 12/18/16 (from the past 72 hour(s))  POCT glycosylated hemoglobin (Hb A1C)     Status: Abnormal   Collection Time: 12/18/16 10:59 AM  Result Value Ref Range   Hemoglobin A1C 8.5    Diabetic Foot Exam - Simple   Simple Foot Form Diabetic  Foot exam was performed with the following findings:  Yes 12/18/2016 11:15 AM  Visual Inspection No deformities, no ulcerations, no other skin breakdown bilaterally:  Yes Sensation Testing Intact to touch and monofilament testing bilaterally:  Yes Pulse Check Posterior Tibialis and Dorsalis pulse intact bilaterally:  Yes Comments Normal exam     Assessment/Plan:  Diabetes mellitus type 2, uncontrolled Patient taking metformin 1000 mg BID and glipizide 10mg  BID daily. Hemoglobin A1c down to 8.5 today from 10.4 one year ago.  - Continue patient on current regimen - Encouraged increased exercise and proper diet - Follow-up in 6 months - f/u urine micro albumin/creatine ratio  Skin infection Patient with area of erythema and induration with minimal confluence that was easily expressed by hand. No systemic signs/symptoms.  - doxycycline 100mg  BID x10 days - follow up in two weeks - return precautions discussed  Health maintenance: Due for pap smear, colonoscopy and pneumovax.  - plans to come back in a month to get pap and vaccine - optho and gastroenterology referral

## 2016-12-18 ENCOUNTER — Ambulatory Visit (INDEPENDENT_AMBULATORY_CARE_PROVIDER_SITE_OTHER): Payer: 59 | Admitting: Family Medicine

## 2016-12-18 ENCOUNTER — Encounter: Payer: Self-pay | Admitting: Family Medicine

## 2016-12-18 VITALS — BP 148/80 | HR 82 | Temp 97.8°F | Ht 66.0 in | Wt 176.8 lb

## 2016-12-18 DIAGNOSIS — I10 Essential (primary) hypertension: Secondary | ICD-10-CM

## 2016-12-18 DIAGNOSIS — E118 Type 2 diabetes mellitus with unspecified complications: Secondary | ICD-10-CM | POA: Diagnosis not present

## 2016-12-18 DIAGNOSIS — IMO0001 Reserved for inherently not codable concepts without codable children: Secondary | ICD-10-CM

## 2016-12-18 DIAGNOSIS — L089 Local infection of the skin and subcutaneous tissue, unspecified: Secondary | ICD-10-CM

## 2016-12-18 DIAGNOSIS — E1165 Type 2 diabetes mellitus with hyperglycemia: Secondary | ICD-10-CM

## 2016-12-18 DIAGNOSIS — Z0100 Encounter for examination of eyes and vision without abnormal findings: Secondary | ICD-10-CM | POA: Diagnosis not present

## 2016-12-18 DIAGNOSIS — Z1211 Encounter for screening for malignant neoplasm of colon: Secondary | ICD-10-CM | POA: Diagnosis not present

## 2016-12-18 DIAGNOSIS — E785 Hyperlipidemia, unspecified: Secondary | ICD-10-CM

## 2016-12-18 DIAGNOSIS — G5601 Carpal tunnel syndrome, right upper limb: Secondary | ICD-10-CM

## 2016-12-18 LAB — POCT GLYCOSYLATED HEMOGLOBIN (HGB A1C): HEMOGLOBIN A1C: 8.5

## 2016-12-18 MED ORDER — METFORMIN HCL 1000 MG PO TABS: 1000.0000 mg | ORAL_TABLET | Freq: Two times a day (BID) | ORAL | 11 refills | Status: DC

## 2016-12-18 MED ORDER — ATORVASTATIN CALCIUM 80 MG PO TABS: 80.0000 mg | ORAL_TABLET | Freq: Every day | ORAL | 4 refills | Status: DC

## 2016-12-18 MED ORDER — DOXYCYCLINE HYCLATE 100 MG PO TABS: 100.0000 mg | ORAL_TABLET | Freq: Two times a day (BID) | ORAL | 0 refills | Status: DC

## 2016-12-18 MED ORDER — ASPIRIN 81 MG PO TABS: 81.0000 mg | ORAL_TABLET | Freq: Every day | ORAL | 6 refills | Status: DC

## 2016-12-18 MED ORDER — GABAPENTIN 100 MG PO CAPS: 100.0000 mg | ORAL_CAPSULE | Freq: Three times a day (TID) | ORAL | 0 refills | Status: DC | PRN

## 2016-12-18 MED ORDER — GLIPIZIDE 10 MG PO TABS: 10.0000 mg | ORAL_TABLET | Freq: Two times a day (BID) | ORAL | 12 refills | Status: DC

## 2016-12-18 MED ORDER — LISINOPRIL-HYDROCHLOROTHIAZIDE 20-12.5 MG PO TABS: 1.0000 | ORAL_TABLET | Freq: Every day | ORAL | 3 refills | Status: DC

## 2016-12-18 MED FILL — GABAPENTIN 100 MG CAP: 100 | 30 days supply | Qty: 90 | Fill #0

## 2016-12-18 MED FILL — DOXYCYCLINE HYCLATE 100 MG: 100 | 10 days supply | Qty: 20 | Fill #0

## 2016-12-18 MED FILL — ATORVASTATIN 80 MG TABLET: 80 | 90 days supply | Qty: 90 | Fill #0

## 2016-12-18 MED FILL — glipiZIDE 10 MG TABS: 10 | 30 days supply | Qty: 60 | Fill #0

## 2016-12-18 MED FILL — metFORMIN HCL 1000 MG TABS: 1000 | 30 days supply | Qty: 60 | Fill #0

## 2016-12-18 MED FILL — LISINOPRIL-HCTZ 20-12.5 MG: 20-12.5 | 90 days supply | Qty: 90 | Fill #0

## 2016-12-18 MED FILL — ASPIRIN EC 81 MG TABLET: 81 | 30 days supply | Qty: 30 | Fill #0

## 2016-12-18 NOTE — Assessment & Plan Note (Addendum)
Patient taking metformin 1000 mg BID and glipizide 10mg  BID daily. Hemoglobin A1c down to 8.5 today from 10.4 one year ago.  - Continue patient on current regimen - Encouraged increased exercise and proper diet - Follow-up in 6 months - f/u urine micro albumin/creatine ratio

## 2016-12-18 NOTE — Assessment & Plan Note (Addendum)
Patient with area of erythema and induration with minimal confluence that was easily expressed by hand. No systemic signs/symptoms.  - doxycycline 100mg  BID x10 days - follow up in two weeks - return precautions discussed

## 2016-12-18 NOTE — Patient Instructions (Signed)
Sally Mclaughlin, you were seen today for diabetes follow up and skin infection on your abdomen.   Keep up the good work with your diabetes. Continue to eat healthy and work on exercise.  I prescribed you an antibiotic for your infection called doxycycline.  You will take this two times daily for 10 days. Please come back in two weeks to be seen.  If you develop any worsening pain, drainage, fevers or chills please come back sooner.   Very nice to see you, [name] L. Rosalyn Gess, [place] Resident PGY-2 12/18/2016 3:13 PM

## 2016-12-19 LAB — MICROALBUMIN / CREATININE URINE RATIO
CREATININE, UR: 152.2 mg/dL
Microalb/Creat Ratio: 18.8 mg/g creat (ref 0.0–30.0)
Microalbumin, Urine: 28.6 ug/mL

## 2016-12-21 ENCOUNTER — Encounter: Payer: Self-pay | Admitting: Student

## 2017-01-01 ENCOUNTER — Other Ambulatory Visit (HOSPITAL_COMMUNITY)
Admission: RE | Admit: 2017-01-01 | Discharge: 2017-01-01 | Disposition: A | Discharge status: Home / Self Care | Payer: 59 | Source: Ambulatory Visit | Attending: Family Medicine | Admitting: Family Medicine

## 2017-01-01 ENCOUNTER — Ambulatory Visit (INDEPENDENT_AMBULATORY_CARE_PROVIDER_SITE_OTHER): Payer: 59 | Admitting: Family Medicine

## 2017-01-01 ENCOUNTER — Encounter: Payer: Self-pay | Admitting: Family Medicine

## 2017-01-01 VITALS — BP 136/74 | HR 72 | Temp 98.5°F | Ht 66.0 in | Wt 180.8 lb

## 2017-01-01 DIAGNOSIS — R739 Hyperglycemia, unspecified: Secondary | ICD-10-CM

## 2017-01-01 DIAGNOSIS — Z01419 Encounter for gynecological examination (general) (routine) without abnormal findings: Secondary | ICD-10-CM

## 2017-01-01 NOTE — Progress Notes (Signed)
51 y.o. year old female presents for well woman/preventative visit and annual GYN examination.  Acute Concerns: Currently in the process of divorce. Husband has had strokes in the past and this has unfortunately changed his mood.  Did not sign paper work and technically is still married, although has been separated since 2016 and she has not seen him.    Diet: Biscuits, gravy and [place] bacon, chicken, usually does not eat dinner.   Exercise: Walking around all night at work (works for [employer] at [employer])  Sexual/Birth History: Three children, 35, 33 and 30.  Has one grandson who just went off to college.   Birth Control: none  POA/Living Will: none, but wants to work on one  Social:  Social History   Social History  . Marital status: Married    Spouse name: N/A  . Number of children: N/A  . Years of education: N/A   Social History Main Topics  . Smoking status: Light Tobacco Smoker    Types: Cigarettes  . Smokeless tobacco: Never Used  . Alcohol use No  . Drug use: No  . Sexual activity: Not Asked   Other Topics Concern  . None   Social History Narrative  . None    Immunization: Immunization History  Administered Date(s) Administered  . Influenza Whole 02/22/2008  . Td 10/16/1997  . Tdap 11/12/2015    Cancer Screening:  Pap Smear: will receive one today  Mammogram: up to date  Colonoscopy: referral sent      Physical Exam: VITALS: Reviewed GEN: Pleasant female, NAD HEENT: Normocephalic, PERRL, EOMI, no scleral icterus, bilateral TM pearly grey, nasal septum midline, MMM, uvula midline, no anterior or posterior lymphadenopathy, no thyromegaly CARDIAC:RRR, S1 and S2 present, no murmur, no heaves/thrills RESP: CTAB, normal effort axillary lymphadenopathy. ABD: soft, no tenderness, normal bowel sounds GU/GYN:Exam performed in the presence of a chaperone. Normal external genitalia. Cervix unremarkable. Bimanual exam identified no masses EXT: No  edema, 2+ radial and DP pulses SKIN: no rash  ASSESSMENT & PLAN: 51 y.o. female presents for annual well woman/preventative exam and GYN exam. Please see problem specific assessment and plan.   Encounter for well woman exam Concerns today regarding incomplete divorce process with her husband. Questioning whether she should reconcile with him has not seen him since 2016.  See history of present illness for more information. No current complaints.  Diet does seem heavy with carbohydrates and fat. Already on Lipitor 80 mg daily.  - due for repeat BMP and CBC and lipid panel - pap smear today - working on getting colonoscopy scheduled  - ophthalmology referral placed  Madix Blowe L. Rosalyn Gess, [place] Resident PGY-2 01/01/2017 1:58 PM

## 2017-01-01 NOTE — Assessment & Plan Note (Signed)
Concerns today regarding incomplete divorce process with her husband. Questioning whether she should reconcile with him has not seen him since 2016.  See history of present illness for more information. No current complaints.  Diet does seem heavy with carbohydrates and fat. Already on Lipitor 80 mg daily.  - due for repeat BMP and CBC and lipid panel - pap smear today

## 2017-01-01 NOTE — Patient Instructions (Addendum)
Sally Mclaughlin, you were seen today for a check up and follow up for your skin infection.  I am checking your cholesterol today as well as your other labs I will follow with you if there are any abnormalities.   You also had a pap smear today.  I will follow up with you when the results are back.  Very nice seeing you today, Sally Mclaughlin L. Rosalyn Gess, [place] Resident PGY-2 01/01/2017 11:01 AM

## 2017-01-04 ENCOUNTER — Other Ambulatory Visit: Payer: 59

## 2017-01-05 LAB — CYTOLOGY - PAP
DIAGNOSIS: NEGATIVE
HPV (WINDOPATH): NOT DETECTED

## 2017-01-07 ENCOUNTER — Telehealth: Payer: Self-pay | Admitting: Family Medicine

## 2017-01-07 NOTE — Telephone Encounter (Signed)
Spoke with Mylani, let her know PAP was normal and reminded her to follow up to get labs drawn.   Aakash Hollomon L. Rosalyn Gess, [place] Resident PGY-2 01/07/2017 9:10 AM

## 2017-01-08 ENCOUNTER — Other Ambulatory Visit: Payer: 59

## 2017-01-08 DIAGNOSIS — R739 Hyperglycemia, unspecified: Secondary | ICD-10-CM

## 2017-01-09 LAB — COMPREHENSIVE METABOLIC PANEL
A/G RATIO: 1.5 (ref 1.2–2.2)
ALBUMIN: 4.4 g/dL (ref 3.5–5.5)
ALT: 29 IU/L (ref 0–32)
AST: 22 IU/L (ref 0–40)
Alkaline Phosphatase: 90 IU/L (ref 39–117)
BILIRUBIN TOTAL: 0.6 mg/dL (ref 0.0–1.2)
BUN / CREAT RATIO: 20 (ref 9–23)
BUN: 15 mg/dL (ref 6–24)
CHLORIDE: 99 mmol/L (ref 96–106)
CO2: 23 mmol/L (ref 20–29)
Calcium: 9.9 mg/dL (ref 8.7–10.2)
Creatinine, Ser: 0.74 mg/dL (ref 0.57–1.00)
GFR calc non Af Amer: 93 mL/min/{1.73_m2} (ref >59)
GFR, EST AFRICAN AMERICAN: 107 mL/min/{1.73_m2} (ref >59)
Globulin, Total: 3 g/dL (ref 1.5–4.5)
Glucose: 240 mg/dL — ABNORMAL HIGH (ref 65–99)
POTASSIUM: 5 mmol/L (ref 3.5–5.2)
Sodium: 135 mmol/L (ref 134–144)
TOTAL PROTEIN: 7.4 g/dL (ref 6.0–8.5)

## 2017-01-09 LAB — CBC
HEMOGLOBIN: 11.5 g/dL (ref 11.1–15.9)
Hematocrit: 32.9 % — ABNORMAL LOW (ref 34.0–46.6)
MCH: 30.9 pg (ref 26.6–33.0)
MCHC: 35 g/dL (ref 31.5–35.7)
MCV: 88 fL (ref 79–97)
Platelets: 247 10*3/uL (ref 150–379)
RBC: 3.72 x10E6/uL — AB (ref 3.77–5.28)
RDW: 13 % (ref 12.3–15.4)
WBC: 5.3 10*3/uL (ref 3.4–10.8)

## 2017-01-09 LAB — LIPID PANEL
CHOLESTEROL TOTAL: 125 mg/dL (ref 100–199)
Chol/HDL Ratio: 2.4 ratio (ref 0.0–4.4)
HDL: 53 mg/dL (ref >39)
LDL Calculated: 41 mg/dL (ref 0–99)
Triglycerides: 156 mg/dL — ABNORMAL HIGH (ref 0–149)
VLDL Cholesterol Cal: 31 mg/dL (ref 5–40)

## 2017-01-12 ENCOUNTER — Encounter: Payer: Self-pay | Admitting: Family Medicine

## 2017-02-22 ENCOUNTER — Other Ambulatory Visit: Payer: Self-pay | Admitting: Family Medicine

## 2017-02-22 DIAGNOSIS — G5601 Carpal tunnel syndrome, right upper limb: Secondary | ICD-10-CM

## 2017-02-22 MED FILL — glipiZIDE 10 MG TABS: 10 | 30 days supply | Qty: 60 | Fill #1

## 2017-02-22 MED FILL — ASPIRIN ADULT LOW STRENGTH: 81 | 30 days supply | Qty: 30 | Fill #1

## 2017-02-22 MED FILL — metFORMIN HCL 1000 MG TABS: 1000 | 30 days supply | Qty: 60 | Fill #1

## 2017-02-22 MED FILL — GABAPENTIN 100 MG CAP: 100 | 30 days supply | Qty: 90 | Fill #0

## 2017-03-25 ENCOUNTER — Other Ambulatory Visit: Payer: Self-pay | Admitting: Family Medicine

## 2017-03-25 DIAGNOSIS — G5601 Carpal tunnel syndrome, right upper limb: Secondary | ICD-10-CM

## 2017-03-25 MED FILL — metFORMIN HCL 1000 MG TABS: 1000 | 30 days supply | Qty: 60 | Fill #2

## 2017-03-25 MED FILL — ASPIRIN ADULT LOW STRENGTH: 81 | 30 days supply | Qty: 30 | Fill #2

## 2017-03-25 MED FILL — GABAPENTIN 100 MG CAP: 100 | 30 days supply | Qty: 90 | Fill #0

## 2017-03-25 MED FILL — glipiZIDE 10 MG TABS: 10 | 30 days supply | Qty: 60 | Fill #2

## 2017-03-26 MED FILL — LISINOPRIL-HCTZ 20-12.5 MG: 20-12.5 | 90 days supply | Qty: 90 | Fill #1

## 2017-03-26 MED FILL — ATORVASTATIN 80 MG TABLET: 80 | 90 days supply | Qty: 90 | Fill #1

## 2017-09-15 ENCOUNTER — Other Ambulatory Visit: Payer: Self-pay | Admitting: Family Medicine

## 2017-09-15 DIAGNOSIS — Z1231 Encounter for screening mammogram for malignant neoplasm of breast: Secondary | ICD-10-CM

## 2017-10-05 ENCOUNTER — Ambulatory Visit
Admission: RE | Admit: 2017-10-05 | Discharge: 2017-10-05 | Disposition: A | Discharge status: Home / Self Care | Payer: 59 | Source: Ambulatory Visit | Attending: Family Medicine | Admitting: Family Medicine

## 2017-10-05 DIAGNOSIS — Z1231 Encounter for screening mammogram for malignant neoplasm of breast: Secondary | ICD-10-CM

## 2018-06-15 ENCOUNTER — Telehealth: Payer: Self-pay

## 2018-06-15 ENCOUNTER — Other Ambulatory Visit: Payer: Self-pay

## 2018-06-15 ENCOUNTER — Ambulatory Visit (INDEPENDENT_AMBULATORY_CARE_PROVIDER_SITE_OTHER): Payer: 59 | Admitting: Family Medicine

## 2018-06-15 ENCOUNTER — Encounter: Payer: Self-pay | Admitting: Family Medicine

## 2018-06-15 VITALS — BP 180/90 | HR 94 | Temp 98.0°F | Wt 166.2 lb

## 2018-06-15 DIAGNOSIS — R1013 Epigastric pain: Secondary | ICD-10-CM | POA: Diagnosis not present

## 2018-06-15 DIAGNOSIS — E1165 Type 2 diabetes mellitus with hyperglycemia: Secondary | ICD-10-CM | POA: Diagnosis not present

## 2018-06-15 DIAGNOSIS — IMO0001 Reserved for inherently not codable concepts without codable children: Secondary | ICD-10-CM

## 2018-06-15 DIAGNOSIS — Z23 Encounter for immunization: Secondary | ICD-10-CM

## 2018-06-15 DIAGNOSIS — E785 Hyperlipidemia, unspecified: Secondary | ICD-10-CM | POA: Diagnosis not present

## 2018-06-15 DIAGNOSIS — I10 Essential (primary) hypertension: Secondary | ICD-10-CM

## 2018-06-15 LAB — POCT GLYCOSYLATED HEMOGLOBIN (HGB A1C): HbA1c POC (<> result, manual entry): >15 % (ref 4.0–5.6)

## 2018-06-15 MED ORDER — ATORVASTATIN CALCIUM 80 MG PO TABS: 80.0000 mg | ORAL_TABLET | Freq: Every day | ORAL | 4 refills | Status: DC

## 2018-06-15 MED ORDER — METFORMIN HCL 500 MG PO TABS: 500.0000 mg | ORAL_TABLET | Freq: Two times a day (BID) | ORAL | 0 refills | Status: DC

## 2018-06-15 MED ORDER — CALCIUM CARBONATE ANTACID 500 MG PO CHEW: 1.0000 | CHEWABLE_TABLET | Freq: Three times a day (TID) | ORAL | 1 refills | Status: DC | PRN

## 2018-06-15 MED ORDER — GLIPIZIDE 10 MG PO TABS: 10.0000 mg | ORAL_TABLET | Freq: Two times a day (BID) | ORAL | 12 refills | Status: DC

## 2018-06-15 MED ORDER — LISINOPRIL-HYDROCHLOROTHIAZIDE 20-12.5 MG PO TABS: 1.0000 | ORAL_TABLET | Freq: Every day | ORAL | 3 refills | Status: DC

## 2018-06-15 MED ORDER — FREESTYLE LITE DEVI: 1.0000 | Freq: Once | 0 refills | Status: AC

## 2018-06-15 MED FILL — ATORVASTATIN 80 MG TABLET: 80 | 90 days supply | Qty: 90 | Fill #0

## 2018-06-15 MED FILL — glipiZIDE 10 MG TABS: 10 | 30 days supply | Qty: 60 | Fill #0

## 2018-06-15 MED FILL — LISINOPRIL-HCTZ 20-12.5 MG: 20-12.5 | 90 days supply | Qty: 90 | Fill #0

## 2018-06-15 MED FILL — metFORMIN HCL 500 MG TABS: 500 | 30 days supply | Qty: 60 | Fill #0

## 2018-06-15 MED FILL — FREESTYLE LITE METER: 25 days supply | Qty: 1 | Fill #0

## 2018-06-15 NOTE — Assessment & Plan Note (Signed)
  Uncontrolled, asymptomatic. Restarting lisinopril-hctz today. Check CMP, CBC. Will repeat labs in 2 weeks at follow up visit.

## 2018-06-15 NOTE — Assessment & Plan Note (Signed)
  Restarting lipitor 80 mg today, check lipid panel and CMP. Follow up 2 weeks.

## 2018-06-15 NOTE — Patient Instructions (Signed)
  Good to see you today Please limit tylenol to 1000 mg every 6 hours at max. The way you were taking it before is dangerous for your liver.  We will check labs today and let you know the results.  I need to see you back in 2 weeks to check in and repeat labs.  If you have questions or concerns please do not hesitate to call at [phone].  Lucila Maine, DO PGY-3, [facility] 06/15/2018 11:19 AM

## 2018-06-15 NOTE — Telephone Encounter (Signed)
Fax from pharmacy to please also send in test strips and lancets for meter that was sent.  Danley Danker, RN [facility] RN)

## 2018-06-15 NOTE — Progress Notes (Signed)
    Subjective:    Patient ID: Sally Mclaughlin, female    DOB: [birth year], 53 y.o.   MRN: [id number]  CC: "stomach pains"  HPI: Epigastric pain radiating around sides to her back for past 3 weeks. Managed with tylenol 1 gram every 4 hours. She reports she was "hunched over in pain" but continued to go to work. She works with environmental services from 6p-2a. She denies nausea, vomiting, diarrhea, fevers. She reports increased volume of urine but no burning or urgency. She denies dark tarry stools or blood in stools. She endorses hemorrhoids that itch. She denies sick contacts. She reports appetite has been normal. She reports the pain has subsided for past 2 days. She reports moderate alcohol intake "beer and liquor on weekends". Denies daily drinking. Smokes "1 cigarette a week". Denies drug use.  She reports she has been out of all medications for >1 year. She is interested in restarting medications at this time. She does not have a glucometer. She denies chest pain, SOB, vision changes.   Smoking status reviewed- current smoker  Review of Systems- see HPI   Objective:  BP (!) 180/90   Pulse 94   Temp 98 F (36.7 C) (Oral)   Wt 166 lb 4 oz (75.4 kg)   LMP 11/25/2012   SpO2 96%   BMI 26.83 kg/m  Vitals and nursing note reviewed  General: well nourished, in no acute distress HEENT: normocephalic, MMM Cardiac: RRR, clear S1 and S2, no murmurs, rubs, or gallops Respiratory: clear to auscultation bilaterally, no increased work of breathing Abdomen: soft, nondistended, no masses or organomegaly. Mild tenderness in epigastric region, no guarding or rebound. Bowel sounds present in all 4 quadrants. Extremities: no edema or cyanosis. +2 radial pulses bilaterally  Neuro: alert and oriented, no focal deficits Psych: pressured speech  Assessment & Plan:    Hyperlipidemia  Restarting lipitor 80 mg today, check lipid panel and CMP. Follow up 2 weeks.  Diabetes mellitus type 2,  uncontrolled (HCC)  A1C >15. Patient unwilling to use insulin or injectable medications at this time. Will restart metformin at 500 mg BID due to GI side effects and titrate up to 1000 mg BID at follow up visit in 2 weeks. Restarting glipizide 10 mg BID. Glucometer ordered. Instructed patient to monitor CBGs fasting daily at least. Uncontrolled DM may be contributing to abdominal pain.  HYPERTENSION, BENIGN SYSTEMIC  Uncontrolled, asymptomatic. Restarting lisinopril-hctz today. Check CMP, CBC. Will repeat labs in 2 weeks at follow up visit.  Epigastric pain  Broad differential including GERD, pancreatitis, gallstones, uncontrolled DM. No urinary symptoms. Daily bowel movements. Appetite normal, no vomiting or diarrhea or signs of infection. She has lost 14 pounds since last visit, but this was August 2018. Patient taking 6 g tylenol daily. Instructed her to stop this. She reports she has not had the pain in 2 days. Will check CMP, lipase today. ED precautions given if pain returns. Will try tums in meantime, await lab results. If pain persists would obtain abdominal imaging. Patient verbalized understanding and agreement with plan.     Return in about 2 weeks (around 06/29/2018).   Lucila Maine, DO Family Medicine Resident PGY-3

## 2018-06-15 NOTE — Assessment & Plan Note (Signed)
  Broad differential including GERD, pancreatitis, gallstones, uncontrolled DM. No urinary symptoms. Daily bowel movements. Appetite normal, no vomiting or diarrhea or signs of infection. She has lost 14 pounds since last visit, but this was August 2018. Patient taking 6 g tylenol daily. Instructed her to stop this. She reports she has not had the pain in 2 days. Will check CMP, lipase today. ED precautions given if pain returns. Will try tums in meantime, await lab results. If pain persists would obtain abdominal imaging. Patient verbalized understanding and agreement with plan.

## 2018-06-15 NOTE — Assessment & Plan Note (Signed)
  A1C >15. Patient unwilling to use insulin or injectable medications at this time. Will restart metformin at 500 mg BID due to GI side effects and titrate up to 1000 mg BID at follow up visit in 2 weeks. Restarting glipizide 10 mg BID. Glucometer ordered. Instructed patient to monitor CBGs fasting daily at least. Uncontrolled DM may be contributing to abdominal pain.

## 2018-06-16 ENCOUNTER — Telehealth: Payer: Self-pay | Admitting: Family Medicine

## 2018-06-16 ENCOUNTER — Encounter: Payer: Self-pay | Admitting: Family Medicine

## 2018-06-16 LAB — COMPREHENSIVE METABOLIC PANEL
A/G RATIO: 1.3 (ref 1.2–2.2)
ALBUMIN: 4.5 g/dL (ref 3.8–4.9)
ALT: 12 IU/L (ref 0–32)
AST: 11 IU/L (ref 0–40)
Alkaline Phosphatase: 147 IU/L — ABNORMAL HIGH (ref 39–117)
BUN/Creatinine Ratio: 14 (ref 9–23)
BUN: 14 mg/dL (ref 6–24)
Bilirubin Total: 0.6 mg/dL (ref 0.0–1.2)
CALCIUM: 10.1 mg/dL (ref 8.7–10.2)
CO2: 19 mmol/L — ABNORMAL LOW (ref 20–29)
Chloride: 93 mmol/L — ABNORMAL LOW (ref 96–106)
Creatinine, Ser: 1.01 mg/dL — ABNORMAL HIGH (ref 0.57–1.00)
GFR calc Af Amer: 73 mL/min/{1.73_m2} (ref >59)
GFR calc non Af Amer: 63 mL/min/{1.73_m2} (ref >59)
GLOBULIN, TOTAL: 3.6 g/dL (ref 1.5–4.5)
Glucose: 523 mg/dL (ref 65–99)
Potassium: 4.2 mmol/L (ref 3.5–5.2)
SODIUM: 130 mmol/L — AB (ref 134–144)
Total Protein: 8.1 g/dL (ref 6.0–8.5)

## 2018-06-16 LAB — CBC
Hematocrit: 41.6 % (ref 34.0–46.6)
Hemoglobin: 14.2 g/dL (ref 11.1–15.9)
MCH: 30.5 pg (ref 26.6–33.0)
MCHC: 34.1 g/dL (ref 31.5–35.7)
MCV: 90 fL (ref 79–97)
PLATELETS: 279 10*3/uL (ref 150–450)
RBC: 4.65 x10E6/uL (ref 3.77–5.28)
RDW: 12.6 % (ref 11.7–15.4)
WBC: 6.2 10*3/uL (ref 3.4–10.8)

## 2018-06-16 LAB — LIPID PANEL
Chol/HDL Ratio: 5.1 ratio — ABNORMAL HIGH (ref 0.0–4.4)
Cholesterol, Total: 281 mg/dL — ABNORMAL HIGH (ref 100–199)
HDL: 55 mg/dL (ref >39)
LDL Calculated: 168 mg/dL — ABNORMAL HIGH (ref 0–99)
Triglycerides: 288 mg/dL — ABNORMAL HIGH (ref 0–149)
VLDL Cholesterol Cal: 58 mg/dL — ABNORMAL HIGH (ref 5–40)

## 2018-06-16 LAB — LIPASE: Lipase: 97 U/L — ABNORMAL HIGH (ref 14–72)

## 2018-06-16 MED ORDER — FREESTYLE LANCETS MISC
12 refills | Status: DC
Start: 2018-06-16 — End: 2019-09-07

## 2018-06-16 MED ORDER — GLUCOSE BLOOD VI STRP: ORAL_STRIP | 12 refills | Status: DC

## 2018-06-16 MED FILL — FREESTYLE LITE TEST STRIP: 30 days supply | Qty: 100 | Fill #0

## 2018-06-16 MED FILL — FREESTYLE LANCETS: 30 days supply | Qty: 100 | Fill #0

## 2018-06-16 NOTE — Telephone Encounter (Signed)
[facility] critical lab value of glucose 523. All other lab values were within normal range. Will forward to PCP since this is nonemergent.  Bufford Lope, DO PGY-3, [place] [facility] 06/16/2018 2:14 AM

## 2018-06-16 NOTE — Progress Notes (Signed)
Letter sent to patient. She has follow up appt in 2 weeks.

## 2018-06-16 NOTE — Telephone Encounter (Signed)
  Lancets and test strips sent to pharmacy

## 2018-06-29 ENCOUNTER — Ambulatory Visit (INDEPENDENT_AMBULATORY_CARE_PROVIDER_SITE_OTHER): Payer: 59 | Admitting: Family Medicine

## 2018-06-29 ENCOUNTER — Other Ambulatory Visit: Payer: Self-pay

## 2018-06-29 ENCOUNTER — Encounter: Payer: Self-pay | Admitting: Family Medicine

## 2018-06-29 VITALS — BP 110/60 | HR 81 | Temp 98.0°F | Wt 172.1 lb

## 2018-06-29 DIAGNOSIS — F17200 Nicotine dependence, unspecified, uncomplicated: Secondary | ICD-10-CM | POA: Diagnosis not present

## 2018-06-29 DIAGNOSIS — Z1211 Encounter for screening for malignant neoplasm of colon: Secondary | ICD-10-CM

## 2018-06-29 DIAGNOSIS — E1165 Type 2 diabetes mellitus with hyperglycemia: Secondary | ICD-10-CM | POA: Diagnosis not present

## 2018-06-29 DIAGNOSIS — I10 Essential (primary) hypertension: Secondary | ICD-10-CM

## 2018-06-29 NOTE — Assessment & Plan Note (Signed)
  Much better today after restarting lisinopril-HCTZ. Will check BMP today. Follow up 2 months.

## 2018-06-29 NOTE — Progress Notes (Signed)
    Subjective:    Patient ID: Sally Mclaughlin, female    DOB: [birth year], 53 y.o.   MRN: [id number]   CC: follow up high blood sugars and high blood pressure  HPI: at last visit BP, sugars elevated. Patient had been out of medications for >1 year. Restarted lipitor, metformin, glipizide, lisinopril-hctz at that visit.   Today she reports compliance with her medications with the exception of not taking metformin twice a day. She states this is due to her work schedule. She goes in at 6 pm and gets home around 3 am. She will take all her medications at 3 am. She does not want to take the metformin again before going in to work as it upsets her stomach. She is willing to take double the dose of metformin at 3 am.   She is not checking CBGs at home as she did not get test strips from pharmacy and kit did not include any.   Smoking status reviewed- current smoker, about 1-2 cigarettes a day, quit successfully about 10 years ago cold [place] but picked it back up. Interested in quitting again.   Review of Systems- She denies increased thirst or hunger. Endorses increased urination. Endorses constipation. Denies chest pain, SOB, fevers, chills, nausea, vomiting, abdominal pain.     Objective:  BP 110/60   Pulse 81   Temp 98 F (36.7 C) (Oral)   Wt 172 lb 2 oz (78.1 kg)   LMP 11/25/2012   SpO2 98%   BMI 27.78 kg/m  Vitals and nursing note reviewed  General: well nourished, in no acute distress HEENT: normocephalic, MMM Cardiac: RRR, clear S1 and S2, no murmurs, rubs, or gallops Respiratory: clear to auscultation bilaterally, no increased work of breathing Abdomen: soft, nontender, nondistended, no masses or organomegaly. Bowel sounds present Neuro: alert and oriented, no focal deficits   Assessment & Plan:    HYPERTENSION, BENIGN SYSTEMIC  Much better today after restarting lisinopril-HCTZ. Will check BMP today. Follow up 2 months.  Diabetes mellitus type 2, uncontrolled  ([place])  Chronic, uncontrolled, patient not taking CBGs at home. Increase metformin to 1000 once a day (was taking 500 once a day). Continue glipizide. Test strips at pharmacy, encouraged patient to pick these up and check CBGs fasting daily. Follow up 2 months to repeat CBG. Could switch to ER metformin at next visit.  TOBACCO USER  Encouraged cessation, information given on quitting.  Screening colonoscopy referral placed  Return in about 2 months (around 08/28/2018).   Lucila Maine, DO Family Medicine Resident PGY-3

## 2018-06-29 NOTE — Patient Instructions (Signed)
Great to see you today! Please get your test strips from the pharmacy.   We'll check labs Return in 2 months to check in.  If you have questions or concerns please do not hesitate to call at [phone].  Lucila Maine, DO PGY-3, [facility] 06/29/2018 10:40 AM   Steps to Quit Smoking  Smoking tobacco can be bad for your health. It can also affect almost every organ in your body. Smoking puts you and people around you at risk for many serious long-lasting (chronic) diseases. Quitting smoking is hard, but it is one of the best things that you can do for your health. It is never too late to quit. What are the benefits of quitting smoking? When you quit smoking, you lower your risk for getting serious diseases and conditions. They can include:  Lung cancer or lung disease.  Heart disease.  Stroke.  Heart attack.  Not being able to have children (infertility).  Weak bones (osteoporosis) and broken bones (fractures). If you have coughing, wheezing, and shortness of breath, those symptoms may get better when you quit. You may also get sick less often. If you are pregnant, quitting smoking can help to lower your chances of having a baby of low birth weight. What can I do to help me quit smoking? Talk with your doctor about what can help you quit smoking. Some things you can do (strategies) include:  Quitting smoking totally, instead of slowly cutting back how much you smoke over a period of time.  Going to in-person counseling. You are more likely to quit if you go to many counseling sessions.  Using resources and support systems, such as: ? Database administrator with a Social worker. ? Phone quitlines. ? Careers information officer. ? Support groups or group counseling. ? Text messaging programs. ? Mobile phone apps or applications.  Taking medicines. Some of these medicines may have nicotine in them. If you are pregnant or breastfeeding, do not take any medicines to quit  smoking unless your doctor says it is okay. Talk with your doctor about counseling or other things that can help you. Talk with your doctor about using more than one strategy at the same time, such as taking medicines while you are also going to in-person counseling. This can help make quitting easier. What things can I do to make it easier to quit? Quitting smoking might feel very hard at first, but there is a lot that you can do to make it easier. Take these steps:  Talk to your family and friends. Ask them to support and encourage you.  Call phone quitlines, reach out to support groups, or work with a Social worker.  Ask people who smoke to not smoke around you.  Avoid places that make you want (trigger) to smoke, such as: ? Bars. ? Parties. ? Smoke-break areas at work.  Spend time with people who do not smoke.  Lower the stress in your life. Stress can make you want to smoke. Try these things to help your stress: ? Getting regular exercise. ? Deep-breathing exercises. ? Yoga. ? Meditating. ? Doing a body scan. To do this, close your eyes, focus on one area of your body at a time from head to toe, and notice which parts of your body are tense. Try to relax the muscles in those areas.  Download or buy apps on your mobile phone or tablet that can help you stick to your quit plan. There are many free apps, such as QuitGuide  from the CDC [facility] for Disease Control and Prevention). You can find more support from smokefree.gov and other websites. This information is not intended to replace advice given to you by your health care provider. Make sure you discuss any questions you have with your health care provider. Document Released: 02/28/2009 Document Revised: 12/31/2015 Document Reviewed: 09/18/2014 Elsevier Interactive Patient Education  2019 [employer].

## 2018-06-29 NOTE — Assessment & Plan Note (Addendum)
  Chronic, uncontrolled, patient not taking CBGs at home. Increase metformin to 1000 once a day (was taking 500 once a day). Continue glipizide. Test strips at pharmacy, encouraged patient to pick these up and check CBGs fasting daily. Follow up 2 months to repeat CBG. Could switch to ER metformin at next visit.

## 2018-06-29 NOTE — Assessment & Plan Note (Signed)
  Encouraged cessation, information given on quitting.

## 2018-06-30 LAB — BASIC METABOLIC PANEL
BUN/Creatinine Ratio: 16 (ref 9–23)
BUN: 17 mg/dL (ref 6–24)
CALCIUM: 9.4 mg/dL (ref 8.7–10.2)
CHLORIDE: 94 mmol/L — AB (ref 96–106)
CO2: 23 mmol/L (ref 20–29)
Creatinine, Ser: 1.06 mg/dL — ABNORMAL HIGH (ref 0.57–1.00)
GFR calc Af Amer: 69 mL/min/{1.73_m2} (ref >59)
GFR calc non Af Amer: 60 mL/min/{1.73_m2} (ref >59)
Glucose: 417 mg/dL — ABNORMAL HIGH (ref 65–99)
POTASSIUM: 4.6 mmol/L (ref 3.5–5.2)
SODIUM: 135 mmol/L (ref 134–144)

## 2018-08-18 ENCOUNTER — Ambulatory Visit: Payer: 59 | Admitting: Family Medicine

## 2018-08-18 ENCOUNTER — Other Ambulatory Visit: Payer: Self-pay

## 2018-08-18 VITALS — BP 168/82 | HR 77 | Temp 97.9°F | Ht 66.0 in | Wt 177.2 lb

## 2018-08-18 DIAGNOSIS — I10 Essential (primary) hypertension: Secondary | ICD-10-CM

## 2018-08-18 DIAGNOSIS — L732 Hidradenitis suppurativa: Secondary | ICD-10-CM | POA: Diagnosis not present

## 2018-08-18 DIAGNOSIS — M25579 Pain in unspecified ankle and joints of unspecified foot: Secondary | ICD-10-CM | POA: Diagnosis not present

## 2018-08-18 DIAGNOSIS — E785 Hyperlipidemia, unspecified: Secondary | ICD-10-CM | POA: Diagnosis not present

## 2018-08-18 DIAGNOSIS — E1165 Type 2 diabetes mellitus with hyperglycemia: Secondary | ICD-10-CM

## 2018-08-18 MED ORDER — METFORMIN HCL 500 MG PO TABS: 1000.0000 mg | ORAL_TABLET | Freq: Two times a day (BID) | ORAL | 0 refills | Status: DC

## 2018-08-18 MED ORDER — EMPAGLIFLOZIN 10 MG PO TABS: 10.0000 mg | ORAL_TABLET | Freq: Every day | ORAL | 2 refills | Status: DC

## 2018-08-18 MED ORDER — ATORVASTATIN CALCIUM 80 MG PO TABS: 80.0000 mg | ORAL_TABLET | Freq: Every day | ORAL | 4 refills | Status: DC

## 2018-08-18 MED ORDER — LISINOPRIL-HYDROCHLOROTHIAZIDE 20-12.5 MG PO TABS: 1.0000 | ORAL_TABLET | Freq: Every day | ORAL | 3 refills | Status: DC

## 2018-08-18 MED ORDER — CLINDAMYCIN PHOSPHATE 1 % EX GEL: Freq: Two times a day (BID) | CUTANEOUS | 0 refills | Status: DC

## 2018-08-18 MED FILL — LISINOPRIL-HCTZ 20-12.5 MG: 20-12.5 | 90 days supply | Qty: 90 | Fill #0

## 2018-08-18 MED FILL — CLINDAMYCIN PH 1% GEL: 1 | 30 days supply | Qty: 30 | Fill #0

## 2018-08-18 MED FILL — ATORVASTATIN 80 MG TABLET: 80 | 90 days supply | Qty: 90 | Fill #0

## 2018-08-18 MED FILL — metFORMIN HCL 500 MG TABS: 500 | 30 days supply | Qty: 60 | Fill #0

## 2018-08-18 MED FILL — JARDIANCE 10 MG TABLET: 10 | 30 days supply | Qty: 30 | Fill #0

## 2018-08-18 NOTE — Assessment & Plan Note (Addendum)
Poorly controlled due to last A1c check >15.0. I do have some concern Glipizide was making her "dizzy", she is still not checking her CBGs at home thus no way to know if she was truly getting hypoglycemic. - Discussed importance of proper diet to help lower her blood sugar to avoid irreversible health problems. - Start checking CBGs at home. She has test strips and meter at home - Stop glipizide, see if her episodes of "dizziness" and "being out of it" resolve - Increased Metformin to 2000mg  daily - Start Januvia 10mg  daily

## 2018-08-18 NOTE — Assessment & Plan Note (Signed)
Patient requested referral to Podiatry.

## 2018-08-18 NOTE — Assessment & Plan Note (Signed)
Elevated today but no home BP readings.  - Patient instructed to purchase home BP cuff and log recordings. If elevated at home consider adding third agent. - Agree with being on ACEi as she is diabetic. - I would consider amlodipine if a third agent is needed.

## 2018-08-18 NOTE — Assessment & Plan Note (Signed)
-   Refilled Atorvastatin.

## 2018-08-18 NOTE — Progress Notes (Signed)
Subjective: Chief Complaint  Patient presents with  . Follow-up    boils     HPI: Sally Mclaughlin is a 53 y.o. presenting to clinic today to discuss the following:  "Boils" Patient has history of boils under her arm. They typically come and go and she believes it could be related to her deodorant. She first noticed them early this week. They are not painful and have not produced any drainage or foul smelling fluid. It is not painful. She has three small, slightly erythematous bumps less than 2cm in her left axilla. She denies fever, cough, nausea, vomiting, or any other rash.  HTN Patient is not well controlled today but states she is compliant with her medications. She is requesting a refill of her Lisinopril-HCTZ combo pill today. She does not have a home BP cuff and does not check her BP regularly so does not know what it typically is at home.  T2DM Patient is taking Metformin 500mg  twice daily and states she is compliant. She is also taking Glipizide but is concerned that it is making her "drowsy" and "not with it". She denies any LOC or near syncope. Her last A1c was >15.0. She is aware of the need for proper diet and exercise to help lower her blood sugar. We discussed the dangers and health risks of not getting her blood glucose under better control including increased risk of heart attack, stroke, kidney disease, vision impairment, and neuropathy. She denies polyuria, polydipsia, and polyphagia.  ROS noted in HPI.   Past Medical, Surgical, Social, and Family History Reviewed & Updated per EMR.   Pertinent Historical Findings include:   Social History   Tobacco Use  Smoking Status Light Tobacco Smoker  . Types: Cigarettes  Smokeless Tobacco Never Used   Objective: BP (!) 168/82   Pulse 77   Temp 97.9 F (36.6 C) (Axillary)   Ht 5\' 6"  (1.676 m)   Wt 177 lb 3.2 oz (80.4 kg)   LMP 11/25/2012   SpO2 99%   BMI 28.60 kg/m  Vitals and nursing notes reviewed   Physical Exam Gen: Alert and Oriented x 3, NAD HEENT: Normocephalic, atraumatic CV: RRR, no murmurs, normal S1, S2 split Resp: CTAB, no wheezing, rales, or rhonchi, comfortable work of breathing Ext: no clubbing, cyanosis, or edema Neuro: No gross deficits Skin: Left Axilla: three well defined nodules, non-tender, slightly erythematous, less than 2cm in diameter. No skin breakdown, no drainage.   Assessment/Plan:  Essential hypertension Elevated today but no home BP readings.  - Patient instructed to purchase home BP cuff and log recordings. If elevated at home consider adding third agent. - Agree with being on ACEi as she is diabetic. - I would consider amlodipine if a third agent is needed.  Diabetes mellitus type 2, uncontrolled ([place]) Poorly controlled due to last A1c check >15.0. I do have some concern Glipizide was making her "dizzy", she is still not checking her CBGs at home thus no way to know if she was truly getting hypoglycemic. - Discussed importance of proper diet to help lower her blood sugar to avoid irreversible health problems. - Start checking CBGs at home. She has test strips and meter at home - Stop glipizide, see if her episodes of "dizziness" and "being out of it" resolve - Increased Metformin to 2000mg  daily - Start Januvia 10mg  daily  Hyperlipidemia - Refilled Atorvastatin  Pain in joint, ankle and foot Patient requested referral to Podiatry.  Hidradenitis axillaris Patient  appears to have hidradenitis axillaris and has had it intermittently for years. - Clindamycin 4% gel BID for two weeks - Warm Compresses to the area prn - Follow up as needed   PATIENT EDUCATION PROVIDED: See AVS    Diagnosis and plan along with any newly prescribed medication(s) were discussed in detail with this patient today. The patient verbalized understanding and agreed with the plan. Patient advised if symptoms worsen return to clinic or ER.    Orders Placed This Encounter   Procedures  . Ambulatory referral to Podiatry    Referral Priority:   Routine    Referral Type:   Consultation    Referral Reason:   Specialty Services Required    Requested Specialty:   Podiatry    Number of Visits Requested:   1    Meds ordered this encounter  Medications  . DISCONTD: metFORMIN (GLUCOPHAGE) 500 MG tablet    Sig: Take 2 tablets (1,000 mg total) by mouth 2 (two) times daily with a meal.    Dispense:  60 tablet    Refill:  0  . atorvastatin (LIPITOR) 80 MG tablet    Sig: Take 1 tablet (80 mg total) by mouth daily.    Dispense:  90 tablet    Refill:  4  . lisinopril-hydrochlorothiazide (PRINZIDE,ZESTORETIC) 20-12.5 MG tablet    Sig: Take 1 tablet by mouth daily.    Dispense:  90 tablet    Refill:  3  . metFORMIN (GLUCOPHAGE) 500 MG tablet    Sig: Take 2 tablets (1,000 mg total) by mouth 2 (two) times daily with a meal.    Dispense:  60 tablet    Refill:  0  . empagliflozin (JARDIANCE) 10 MG TABS tablet    Sig: Take 10 mg by mouth daily.    Dispense:  30 tablet    Refill:  2  . clindamycin (CLINDAGEL) 1 % gel    Sig: Apply topically 2 (two) times daily.    Dispense:  30 g    Refill:  0     Tim Garlan Fillers, DO 08/18/2018, 11:01 AM PGY-2 [place]

## 2018-08-18 NOTE — Patient Instructions (Addendum)
It was great to meet you today! Thank you for letting me participate in your care!  For your boils I have sent you a antibiotic gel called Clindamycin. Please take it as prescribed.  Today, we discussed your blood pressure. Please continue taking your Lisinopril and HCTZ combo pill. I have sent refills to the pharmacy. Please purchase a blood pressure cuff and begin taking your blood pressure at home. Keep a log of your BP and if it remains high we may need to adjust your medications.  For your diabetes I have made several changes. Please STOP taking Glipizide. I have started you on a new medication called Jardiance. Please take it as prescribed.  I have also refilled your atorvastatin for you high cholesterol.  Be well, Harolyn Rutherford, DO PGY-2, [facility]

## 2018-08-18 NOTE — Assessment & Plan Note (Signed)
Patient appears to have hidradenitis axillaris and has had it intermittently for years. - Clindamycin 4% gel BID for two weeks - Warm Compresses to the area prn - Follow up as needed

## 2018-09-23 ENCOUNTER — Other Ambulatory Visit: Payer: Self-pay | Admitting: Family Medicine

## 2018-09-23 DIAGNOSIS — Z1231 Encounter for screening mammogram for malignant neoplasm of breast: Secondary | ICD-10-CM

## 2018-11-16 ENCOUNTER — Other Ambulatory Visit: Payer: Self-pay

## 2018-11-16 ENCOUNTER — Ambulatory Visit
Admission: RE | Admit: 2018-11-16 | Discharge: 2018-11-16 | Disposition: A | Discharge status: Home / Self Care | Payer: 59 | Source: Ambulatory Visit | Attending: Family Medicine | Admitting: Family Medicine

## 2018-11-16 DIAGNOSIS — Z1231 Encounter for screening mammogram for malignant neoplasm of breast: Secondary | ICD-10-CM

## 2019-08-22 ENCOUNTER — Encounter (HOSPITAL_COMMUNITY): Payer: Self-pay

## 2019-08-22 ENCOUNTER — Other Ambulatory Visit: Payer: Self-pay

## 2019-08-22 ENCOUNTER — Emergency Department (HOSPITAL_COMMUNITY)
Admission: EM | Admit: 2019-08-22 | Discharge: 2019-08-22 | Disposition: A | Discharge status: Home / Self Care | Payer: 59 | Attending: Emergency Medicine | Admitting: Emergency Medicine

## 2019-08-22 DIAGNOSIS — L539 Erythematous condition, unspecified: Secondary | ICD-10-CM | POA: Diagnosis present

## 2019-08-22 DIAGNOSIS — L02211 Cutaneous abscess of abdominal wall: Secondary | ICD-10-CM | POA: Insufficient documentation

## 2019-08-22 DIAGNOSIS — Z7984 Long term (current) use of oral hypoglycemic drugs: Secondary | ICD-10-CM | POA: Diagnosis not present

## 2019-08-22 DIAGNOSIS — Z7982 Long term (current) use of aspirin: Secondary | ICD-10-CM | POA: Diagnosis not present

## 2019-08-22 DIAGNOSIS — Z79899 Other long term (current) drug therapy: Secondary | ICD-10-CM | POA: Diagnosis not present

## 2019-08-22 DIAGNOSIS — E119 Type 2 diabetes mellitus without complications: Secondary | ICD-10-CM | POA: Insufficient documentation

## 2019-08-22 DIAGNOSIS — I10 Essential (primary) hypertension: Secondary | ICD-10-CM | POA: Insufficient documentation

## 2019-08-22 DIAGNOSIS — Z720 Tobacco use: Secondary | ICD-10-CM | POA: Insufficient documentation

## 2019-08-22 DIAGNOSIS — L0291 Cutaneous abscess, unspecified: Secondary | ICD-10-CM

## 2019-08-22 MED ORDER — LIDOCAINE HCL (PF) 1 % IJ SOLN
5.0000 mL | Freq: Once | INTRAMUSCULAR | Status: AC
  Administered 2019-08-22: 5 mL
  Filled 2019-08-22: qty 5

## 2019-08-22 MED ORDER — DOXYCYCLINE HYCLATE 100 MG PO CAPS: 100.0000 mg | ORAL_CAPSULE | Freq: Two times a day (BID) | ORAL | 0 refills | Status: DC

## 2019-08-22 MED FILL — DOXYCYCLINE HYCLATE 100 MG: 100 | 10 days supply | Qty: 20 | Fill #0

## 2019-08-22 NOTE — ED Provider Notes (Signed)
[facility] EMERGENCY DEPARTMENT Provider Note   CSN: [id number] Arrival date & time: 08/22/19  [id number]     History Chief Complaint  Patient presents with  . Abscess    Sally Mclaughlin is a 54 y.o. female.  54 year old female with past medical history of diabetes presents with complaint of abscess to suprapubic area for the past 3 days.  History of abscesses in other locations, never in this area before.  Denies drainage from the area.  Has not been monitoring her glucose.  Denies fever, abdominal pain.  No other complaints or concerns today.        Past Medical History:  Diagnosis Date  . Diabetes mellitus without complication ([place])   . Hypercholesteremia   . Hypertension     Patient Active Problem List   Diagnosis Date Noted  . Hidradenitis axillaris 08/18/2018  . Pain in joint, ankle and foot 08/18/2018  . Epigastric pain 06/15/2018  . Carpal tunnel syndrome 11/12/2015  . TOBACCO USER 02/23/2009  . Hyperlipidemia 07/23/2008  . Diabetes mellitus type 2, uncontrolled ([place]) 05/14/2008  . Overweight 07/15/2006  . Alcohol abuse, unspecified 07/15/2006  . Essential hypertension 07/15/2006    History reviewed. No pertinent surgical history.   OB History   No obstetric history on file.     Family History  Problem Relation Age of Onset  . Ovarian cancer Sister     Social History   Tobacco Use  . Smoking status: Light Tobacco Smoker    Types: Cigarettes  . Smokeless tobacco: Never Used  Substance Use Topics  . Alcohol use: No  . Drug use: No    Home Medications Prior to Admission medications   Medication Sig Start Date End Date Taking? Authorizing Provider  acetaminophen (TYLENOL) 500 MG tablet Take 500 mg by mouth every 6 (six) hours as needed.      [provider]  aspirin 81 MG tablet Take 1 tablet (81 mg total) by mouth daily. 12/18/16   Eloise Levels, MD  atorvastatin (LIPITOR) 80 MG tablet Take 1 tablet (80 mg total) by  mouth daily. 08/18/18   Nuala Alpha, DO  calcium carbonate (TUMS - DOSED IN MG ELEMENTAL CALCIUM) 500 MG chewable tablet Chew 1 tablet (200 mg of elemental calcium total) by mouth 3 (three) times daily as needed for indigestion or heartburn. 06/15/18   Steve Rattler, DO  clindamycin (CLINDAGEL) 1 % gel Apply topically 2 (two) times daily. 08/18/18   Nuala Alpha, DO  doxycycline (VIBRAMYCIN) 100 MG capsule Take 1 capsule (100 mg total) by mouth 2 (two) times daily. 08/22/19   Tacy Learn, PA-C  empagliflozin (JARDIANCE) 10 MG TABS tablet Take 10 mg by mouth daily. 08/18/18   Nuala Alpha, DO  glucose blood (FREESTYLE LITE) test strip Use as instructed 06/16/18   Steve Rattler, DO  Lancets (FREESTYLE) lancets Use as instructed 06/16/18   Steve Rattler, DO  lisinopril-hydrochlorothiazide (PRINZIDE,ZESTORETIC) 20-12.5 MG tablet Take 1 tablet by mouth daily. 08/18/18   Nuala Alpha, DO  metFORMIN (GLUCOPHAGE) 500 MG tablet Take 2 tablets (1,000 mg total) by mouth 2 (two) times daily with a meal. 08/18/18   Nuala Alpha, DO    Allergies    Patient has no known allergies.  Review of Systems   Review of Systems  Constitutional: Negative for fever.  Gastrointestinal: Negative for abdominal pain, nausea and vomiting.  Skin: Positive for color change.  Allergic/Immunologic: Positive for immunocompromised state.  Hematological: Negative for  adenopathy.  All other systems reviewed and are negative.   Physical Exam Updated Vital Signs BP (!) 183/88 (BP Location: Right Arm)   Pulse 91   Temp 98.4 F (36.9 C) (Oral)   Resp 17   LMP 11/25/2012   SpO2 94%   Physical Exam Vitals and nursing note reviewed.  Constitutional:      General: She is not in acute distress.    Appearance: She is well-developed. She is not diaphoretic.  HENT:     Head: Normocephalic and atraumatic.  Pulmonary:     Effort: Pulmonary effort is normal.  Abdominal:     Palpations: Abdomen is soft.      Tenderness: There is no abdominal tenderness.  Skin:    General: Skin is warm and dry.     Findings: Erythema present.       Neurological:     Mental Status: She is alert and oriented to person, place, and time.  Psychiatric:        Behavior: Behavior normal.     ED Results / Procedures / Treatments   Labs (all labs ordered are listed, but only abnormal results are displayed) Labs Reviewed - No data to display  EKG None  Radiology No results found.  Procedures .Marland KitchenIncision and Drainage  Date/Time: 08/22/2019 9:22 AM Performed by: Tacy Learn, PA-C Authorized by: Tacy Learn, PA-C   Consent:    Consent obtained:  Verbal   Consent given by:  Patient   Risks discussed:  Bleeding, incomplete drainage, pain and damage to other organs   Alternatives discussed:  No treatment Universal protocol:    Procedure explained and questions answered to patient or proxy's satisfaction: yes     Relevant documents present and verified: yes     Test results available and properly labeled: yes     Imaging studies available: yes     Required blood products, implants, devices, and special equipment available: yes     Site/side marked: yes     Immediately prior to procedure a time out was called: yes     Patient identity confirmed:  Verbally with patient Location:    Type:  Abscess   Size:  5 cm x 4 cm Pre-procedure details:    Skin preparation:  Betadine Anesthesia (see MAR for exact dosages):    Anesthesia method:  Local infiltration   Local anesthetic:  Lidocaine 1% w/o epi Procedure type:    Complexity:  Complex Procedure details:    Incision types:  Single straight   Incision depth:  Subcutaneous   Scalpel blade:  11   Wound management:  Probed and deloculated, irrigated with saline and extensive cleaning   Drainage:  Purulent   Drainage amount:  Moderate   Packing materials:  1/4 in gauze   Amount 1/4":  2 inches Post-procedure details:    Patient tolerance of  procedure:  Tolerated well, no immediate complications   (including critical care time)  Medications Ordered in ED Medications  lidocaine (PF) (XYLOCAINE) 1 % injection 5 mL (has no administration in time range)    ED Course  I have reviewed the triage vital signs and the nursing notes.  Pertinent labs & imaging results that were available during my care of the patient were reviewed by me and considered in my medical decision making (see chart for details).  Clinical Course as of Aug 21 921  Tue Apr 06, 237  [id number] 54 year old female with history of diabetes presents with suprapubic abscess for  the past 3 days, no drainage.  On exam area is erythematous with induration and central fluctuance.  Successful I&D with packing placed.  Patient was started on doxycycline, recommend warm compresses, recommend see PCP in 2 days for recheck and packing removal.  Given strict return to ER precautions.  Patient verbalizes understanding of discharge instructions and plan.   [LM]    Clinical Course User Index [LM] Roque Lias   MDM Rules/Calculators/A&P                      Final Clinical Impression(s) / ED Diagnoses Final diagnoses:  Abscess    Rx / DC Orders ED Discharge Orders         Ordered    doxycycline (VIBRAMYCIN) 100 MG capsule  2 times daily     08/22/19 0921           Tacy Learn, PA-C 08/22/19 [id number]    Noemi Chapel, MD 08/23/19 [phone]

## 2019-08-22 NOTE — Discharge Instructions (Signed)
Apply warm compresses to the area for 20 minutes at least 3 times daily. Take antibiotics as prescribed and complete the full course.  It is important that you start this medication today. Recommend recheck with your doctor in 2 days for packing removal.  Return to the emergency room for fevers or worsening symptoms.  After this area has healed, you should bathe once weekly using pHisoDerm or Hibiclens soap.

## 2019-08-22 NOTE — ED Triage Notes (Signed)
Pt reports that she has a abscess on her R groin area since Wednesday with no drainage.

## 2019-09-06 ENCOUNTER — Other Ambulatory Visit: Payer: Self-pay

## 2019-09-06 ENCOUNTER — Ambulatory Visit (INDEPENDENT_AMBULATORY_CARE_PROVIDER_SITE_OTHER): Payer: 59 | Admitting: Family Medicine

## 2019-09-06 ENCOUNTER — Encounter: Payer: Self-pay | Admitting: Family Medicine

## 2019-09-06 VITALS — BP 130/80 | HR 82 | Temp 98.5°F | Wt 165.0 lb

## 2019-09-06 DIAGNOSIS — L02219 Cutaneous abscess of trunk, unspecified: Secondary | ICD-10-CM | POA: Diagnosis not present

## 2019-09-06 DIAGNOSIS — E785 Hyperlipidemia, unspecified: Secondary | ICD-10-CM

## 2019-09-06 DIAGNOSIS — E1165 Type 2 diabetes mellitus with hyperglycemia: Secondary | ICD-10-CM

## 2019-09-06 DIAGNOSIS — I10 Essential (primary) hypertension: Secondary | ICD-10-CM

## 2019-09-06 DIAGNOSIS — Z1211 Encounter for screening for malignant neoplasm of colon: Secondary | ICD-10-CM | POA: Diagnosis not present

## 2019-09-06 DIAGNOSIS — Z23 Encounter for immunization: Secondary | ICD-10-CM

## 2019-09-06 HISTORY — DX: Cutaneous abscess of trunk, unspecified: L02.219

## 2019-09-06 LAB — POCT GLYCOSYLATED HEMOGLOBIN (HGB A1C): HbA1c POC (<> result, manual entry): >15 % (ref 4.0–5.6)

## 2019-09-06 NOTE — Assessment & Plan Note (Addendum)
Remains uncontrolled, A1c >15 today. Will provide refills and increase jardiance. New Rx for CBG meter sent to pharmacy. Instructed patient to obtain pill box. Made appointment today for 3 weeks, instructed to bring medications with her to that appointment. Will likely require initiation of GLP-1 agonist at that visit and pharmacy teaching.  Will obtain lipid panel, BMP today. PNA vaccine received today.

## 2019-09-06 NOTE — Progress Notes (Signed)
    SUBJECTIVE:   CHIEF COMPLAINT: Hospital follow-up  HPI:   Seen in [facility] ED 08/22/2019 for 5x4cm suprapubic abscess, underwent I&D with packing without immediate complications.  Placed on 10-day doxycycline course.  Recommended follow-up with PCP for recheck and packing removal.  Finished antibiotics over the weekend. Packing came out the following day. Occasional drainage from site, pain improving.    Has been out of her diabetes medications and statin for quite some time. Does not use a pill box, unsure how long she has been out of medications. Does not check her blood sugars. Needs a new meter.   PERTINENT  PMH / PSH: HTN, T2 DM, hidradenitis axillaris, HLD, alcohol use disorder, tobacco use  OBJECTIVE:   BP 130/80   Pulse 82   Temp 98.5 F (36.9 C) (Oral)   Wt 165 lb (74.8 kg)   LMP 11/25/2012   SpO2 98%   BMI 26.63 kg/m   Gen: well appearing, in NAD Skin: well healing ~2cm area of induration without evidence of persistent abscess or tracking.   ASSESSMENT/PLAN:   Suprapubic abscess Well healing, now s/p antibiotics. Continue with local wound care and keeping area clean and dry. Will need to gain better control of diabetes to prevent future infections.   Diabetes mellitus type 2, uncontrolled ([place]) Remains uncontrolled, A1c >15 today. Will provide refills and increase jardiance. New Rx for CBG meter sent to pharmacy. Instructed patient to obtain pill box. Made appointment today for 3 weeks, instructed to bring medications with her to that appointment. Will likely require initiation of GLP-1 agonist at that visit and pharmacy teaching.  Will obtain lipid panel, BMP today. PNA vaccine received today.     Rory Percy, [place]

## 2019-09-06 NOTE — Patient Instructions (Signed)
It was great to see you!  Our plans for today:  - Your incision looks great.  - Make an appointment soon to follow up for your diabetes.  - We referred you to get a colonoscopy.  We are checking some labs today, we will call you or send you a letter if they are abnormal.   Take care and seek immediate care sooner if you develop any concerns.   Dr. [facility]

## 2019-09-06 NOTE — Assessment & Plan Note (Signed)
Well healing, now s/p antibiotics. Continue with local wound care and keeping area clean and dry. Will need to gain better control of diabetes to prevent future infections.

## 2019-09-07 LAB — BASIC METABOLIC PANEL
BUN/Creatinine Ratio: 19 (ref 9–23)
BUN: 14 mg/dL (ref 6–24)
CO2: 23 mmol/L (ref 20–29)
Calcium: 10.3 mg/dL — ABNORMAL HIGH (ref 8.7–10.2)
Chloride: 96 mmol/L (ref 96–106)
Creatinine, Ser: 0.74 mg/dL (ref 0.57–1.00)
GFR calc Af Amer: 105 mL/min/{1.73_m2} (ref >59)
GFR calc non Af Amer: 91 mL/min/{1.73_m2} (ref >59)
Glucose: 482 mg/dL — ABNORMAL HIGH (ref 65–99)
Potassium: 4.8 mmol/L (ref 3.5–5.2)
Sodium: 133 mmol/L — ABNORMAL LOW (ref 134–144)

## 2019-09-07 LAB — LIPID PANEL
Chol/HDL Ratio: 4.2 ratio (ref 0.0–4.4)
Cholesterol, Total: 245 mg/dL — ABNORMAL HIGH (ref 100–199)
HDL: 58 mg/dL (ref >39)
LDL Chol Calc (NIH): 153 mg/dL — ABNORMAL HIGH (ref 0–99)
Triglycerides: 187 mg/dL — ABNORMAL HIGH (ref 0–149)
VLDL Cholesterol Cal: 34 mg/dL (ref 5–40)

## 2019-09-07 MED ORDER — EMPAGLIFLOZIN 25 MG PO TABS: 25.0000 mg | ORAL_TABLET | Freq: Every day | ORAL | 3 refills | Status: DC

## 2019-09-07 MED ORDER — LISINOPRIL-HYDROCHLOROTHIAZIDE 20-12.5 MG PO TABS: 1.0000 | ORAL_TABLET | Freq: Every day | ORAL | 3 refills | Status: DC

## 2019-09-07 MED ORDER — FREESTYLE LITE DEVI: 1.0000 | Freq: Every day | 0 refills | Status: DC

## 2019-09-07 MED ORDER — METFORMIN HCL 500 MG PO TABS: 1000.0000 mg | ORAL_TABLET | Freq: Two times a day (BID) | ORAL | 3 refills | Status: DC

## 2019-09-07 MED ORDER — FREESTYLE LANCETS MISC: 12 refills | Status: DC

## 2019-09-07 MED ORDER — ATORVASTATIN CALCIUM 80 MG PO TABS: 80.0000 mg | ORAL_TABLET | Freq: Every day | ORAL | 3 refills | Status: DC

## 2019-09-07 MED ORDER — FREESTYLE LITE TEST VI STRP: ORAL_STRIP | 12 refills | Status: DC

## 2019-09-07 MED FILL — LISINOPRIL-HCTZ 20-12.5 MG: 20-12.5 | 90 days supply | Qty: 90 | Fill #0

## 2019-09-07 MED FILL — metFORMIN HCL 500 MG TABS: 500 | 90 days supply | Qty: 360 | Fill #0

## 2019-09-07 MED FILL — FREESTYLE LITE TEST STRIP: 33 days supply | Qty: 100 | Fill #0

## 2019-09-07 MED FILL — FREESTYLE LANCETS: 30 days supply | Qty: 100 | Fill #0

## 2019-09-07 MED FILL — ATORVASTATIN 80 MG TABLET: 80 | 90 days supply | Qty: 90 | Fill #0

## 2019-09-15 ENCOUNTER — Encounter: Payer: Self-pay | Admitting: Gastroenterology

## 2019-09-26 DIAGNOSIS — H2513 Age-related nuclear cataract, bilateral: Secondary | ICD-10-CM | POA: Diagnosis not present

## 2019-09-26 DIAGNOSIS — Z7984 Long term (current) use of oral hypoglycemic drugs: Secondary | ICD-10-CM | POA: Diagnosis not present

## 2019-09-26 DIAGNOSIS — H40003 Preglaucoma, unspecified, bilateral: Secondary | ICD-10-CM | POA: Diagnosis not present

## 2019-09-26 DIAGNOSIS — E119 Type 2 diabetes mellitus without complications: Secondary | ICD-10-CM | POA: Diagnosis not present

## 2019-09-26 DIAGNOSIS — H02834 Dermatochalasis of left upper eyelid: Secondary | ICD-10-CM | POA: Diagnosis not present

## 2019-09-26 DIAGNOSIS — H25013 Cortical age-related cataract, bilateral: Secondary | ICD-10-CM | POA: Diagnosis not present

## 2019-09-26 DIAGNOSIS — H02831 Dermatochalasis of right upper eyelid: Secondary | ICD-10-CM | POA: Diagnosis not present

## 2019-09-26 DIAGNOSIS — Z8679 Personal history of other diseases of the circulatory system: Secondary | ICD-10-CM | POA: Diagnosis not present

## 2019-09-27 ENCOUNTER — Other Ambulatory Visit: Payer: Self-pay

## 2019-09-27 ENCOUNTER — Ambulatory Visit (AMBULATORY_SURGERY_CENTER): Payer: Self-pay

## 2019-09-27 VITALS — Temp 97.7°F | Ht 66.0 in | Wt 168.0 lb

## 2019-09-27 DIAGNOSIS — Z1211 Encounter for screening for malignant neoplasm of colon: Secondary | ICD-10-CM

## 2019-09-27 DIAGNOSIS — Z01818 Encounter for other preprocedural examination: Secondary | ICD-10-CM

## 2019-09-27 MED ORDER — NA SULFATE-K SULFATE-MG SULF 17.5-3.13-1.6 GM/177ML PO SOLN: 1.0000 | Freq: Once | ORAL | 0 refills | Status: AC

## 2019-09-27 NOTE — Progress Notes (Signed)
No allergies to soy or egg Pt is not on blood thinners or diet pills Denies issues with sedation/intubation Denies atrial flutter/fib Denies constipation   Emmi instructions given to pt  Pt is aware of Covid safety and care partner requirements.  

## 2019-09-29 ENCOUNTER — Other Ambulatory Visit: Payer: Self-pay

## 2019-09-29 ENCOUNTER — Ambulatory Visit (INDEPENDENT_AMBULATORY_CARE_PROVIDER_SITE_OTHER): Payer: 59 | Admitting: Family Medicine

## 2019-09-29 ENCOUNTER — Encounter: Payer: Self-pay | Admitting: Gastroenterology

## 2019-09-29 ENCOUNTER — Encounter: Payer: Self-pay | Admitting: Family Medicine

## 2019-09-29 VITALS — BP 122/68 | HR 79 | Ht 66.0 in | Wt 169.0 lb

## 2019-09-29 DIAGNOSIS — E1165 Type 2 diabetes mellitus with hyperglycemia: Secondary | ICD-10-CM

## 2019-09-29 LAB — GLUCOSE, POCT (MANUAL RESULT ENTRY): POC Glucose: 422 mg/dl — AB (ref 70–99)

## 2019-09-29 NOTE — Progress Notes (Signed)
    SUBJECTIVE:   CHIEF COMPLAINT / HPI:   Diabetes, Type 2 - Last A1c >15.0 09/06/19 - Medications: Metformin 1000 mg twice daily, Jardiance 25 mg daily - Compliance: never got jardiance, received 2 metformin bottles from pharmacy.  - Checking BG at home: no - Eye exam: UTD - Foot exam: Due  - Microalbumin: n/a - Statin: yes - Denies symptoms of hypoglycemia, polyuria, polydipsia, foot ulcers/trauma - occasionally will have numbness and tingling of feet.  PERTINENT  PMH / PSH: HTN, T2 DM, hidradenitis axillaris, HLD, overweight, history of alcohol abuse, tobacco use  OBJECTIVE:   BP 122/68   Pulse 79   Ht 5\' 6"  (1.676 m)   Wt 169 lb (76.7 kg)   LMP 11/25/2012   SpO2 99%   BMI 27.28 kg/m   Gen: well appearing, in NAD Skin: foot exam completed without evidence of breakdown. Microfilament wnl.  ASSESSMENT/PLAN:   Diabetes mellitus type 2, uncontrolled ([place]) Still with hyperglycemia, CBG 422 today. Advised patient to pick up jardiance prescription. Most of visit spent in counseling regarding organizing pill box with demonstration and teach back. CBG meter teaching also done today. Foot exam wnl. F/u in one month.   Rory Percy, [place]

## 2019-09-29 NOTE — Assessment & Plan Note (Addendum)
Still with hyperglycemia, CBG 422 today. Advised patient to pick up jardiance prescription. Most of visit spent in counseling regarding organizing pill box with demonstration and teach back. CBG meter teaching also done today. Foot exam wnl. F/u in one month.

## 2019-09-29 NOTE — Patient Instructions (Signed)
It was great to see you!  Our plans for today:  - Go to the pharmacy and pick up your jardiance prescription.  - Organize your pills in the pill box like we showed you today.  - Come back in one month for follow up.  Take care and seek immediate care sooner if you develop any concerns.   Dr. [facility]

## 2019-10-06 ENCOUNTER — Other Ambulatory Visit: Payer: Self-pay | Admitting: Gastroenterology

## 2019-10-06 ENCOUNTER — Ambulatory Visit (INDEPENDENT_AMBULATORY_CARE_PROVIDER_SITE_OTHER): Payer: 59

## 2019-10-06 DIAGNOSIS — Z1159 Encounter for screening for other viral diseases: Secondary | ICD-10-CM | POA: Diagnosis not present

## 2019-10-06 LAB — SARS CORONAVIRUS 2 (TAT 6-24 HRS): SARS Coronavirus 2: NEGATIVE

## 2019-10-11 ENCOUNTER — Encounter: Payer: Self-pay | Admitting: Gastroenterology

## 2019-10-11 ENCOUNTER — Ambulatory Visit (AMBULATORY_SURGERY_CENTER): Payer: 59 | Admitting: Gastroenterology

## 2019-10-11 ENCOUNTER — Other Ambulatory Visit: Payer: Self-pay

## 2019-10-11 VITALS — BP 160/88 | HR 68 | Temp 97.1°F | Resp 12 | Ht 66.0 in | Wt 168.0 lb

## 2019-10-11 DIAGNOSIS — Z1211 Encounter for screening for malignant neoplasm of colon: Secondary | ICD-10-CM | POA: Diagnosis not present

## 2019-10-11 DIAGNOSIS — I10 Essential (primary) hypertension: Secondary | ICD-10-CM | POA: Diagnosis not present

## 2019-10-11 DIAGNOSIS — E119 Type 2 diabetes mellitus without complications: Secondary | ICD-10-CM | POA: Diagnosis not present

## 2019-10-11 DIAGNOSIS — E669 Obesity, unspecified: Secondary | ICD-10-CM | POA: Diagnosis not present

## 2019-10-11 DIAGNOSIS — D125 Benign neoplasm of sigmoid colon: Secondary | ICD-10-CM | POA: Diagnosis not present

## 2019-10-11 DIAGNOSIS — D122 Benign neoplasm of ascending colon: Secondary | ICD-10-CM

## 2019-10-11 DIAGNOSIS — D124 Benign neoplasm of descending colon: Secondary | ICD-10-CM | POA: Diagnosis not present

## 2019-10-11 MED ORDER — FLEET ENEMA 7-19 GM/118ML RE ENEM
1.0000 | ENEMA | Freq: Once | RECTAL | Status: AC
  Administered 2019-10-11: 1 via RECTAL

## 2019-10-11 MED ORDER — SODIUM CHLORIDE 0.9 % IV SOLN: 500.0000 mL | INTRAVENOUS | Status: DC

## 2019-10-11 NOTE — Progress Notes (Signed)
Report to PACU, RN, vss, BBS= Clear.  

## 2019-10-11 NOTE — Progress Notes (Signed)
Vs CW I have reviewed the patient's medical history in detail and updated the computerized patient record.   

## 2019-10-11 NOTE — Op Note (Signed)
[facility] Patient Name: Sally Mclaughlin Procedure Date: 10/11/2019 10:58 AM MRN: [id number] Endoscopist: Jackquline Denmark , MD Age: 54 Referring MD:  Date of Birth: Oct 09, 1965 Gender: Female Account #: [id number] Procedure:                Colonoscopy Indications:              Screening for colorectal malignant neoplasm Medicines:                Monitored Anesthesia Care Procedure:                Pre-Anesthesia Assessment:                           - Prior to the procedure, a History and Physical                            was performed, and patient medications and                            allergies were reviewed. The patient's tolerance of                            previous anesthesia was also reviewed. The risks                            and benefits of the procedure and the sedation                            options and risks were discussed with the patient.                            All questions were answered, and informed consent                            was obtained. Prior Anticoagulants: The patient has                            taken no previous anticoagulant or antiplatelet                            agents. ASA Grade Assessment: II - A patient with                            mild systemic disease. After reviewing the risks                            and benefits, the patient was deemed in                            satisfactory condition to undergo the procedure.                           After obtaining informed consent, the colonoscope  was passed under direct vision. Throughout the                            procedure, the patient's blood pressure, pulse, and                            oxygen saturations were monitored continuously.The                            colonoscopy was performed without difficulty. The                            patient tolerated the procedure well. The quality                            of the bowel  preparation was good. The terminal                            ileum, ileocecal valve, appendiceal orifice, and                            rectum were photographed. The Colonoscope was                            introduced through the anus and advanced to the 2                            cm into the ileum. Scope In: 11:18:51 AM Scope Out: 11:34:47 AM Scope Withdrawal Time: 0 hours 12 minutes 24 seconds  Total Procedure Duration: 0 hours 15 minutes 56 seconds  Findings:                 Three sessile polyps were found in the proximal                            sigmoid colon, mid descending colon and proximal                            ascending colon. The polyps were 4 to 6 mm in size.                            These polyps were removed with a cold snare.                            Resection and retrieval were complete.                           A few small-mouthed rare diverticula were found in                            the sigmoid colon.                           Non-bleeding internal hemorrhoids were found during  retroflexion. The hemorrhoids were small.                           The terminal ileum appeared normal.                           The exam was otherwise without abnormality on                            direct and retroflexion views. Complications:            No immediate complications. Estimated Blood Loss:     Estimated blood loss: none. Impression:               -Colonic polyps s/p polypectomy.                           -Minimal sigmoid diverticulosis.                           -Otherwise normal colonoscopy to TI. Recommendation:           - Patient has a contact number available for                            emergencies. The signs and symptoms of potential                            delayed complications were discussed with the                            patient. Return to normal activities tomorrow.                            Written discharge  instructions were provided to the                            patient.                           - High fiber diet.                           - Continue present medications.                           - Await pathology results.                           - Repeat colonoscopy for surveillance based on                            pathology results.                           - Return to GI office PRN. Jackquline Denmark, MD 10/11/2019 11:41:57 AM This report has been signed electronically.

## 2019-10-11 NOTE — Patient Instructions (Signed)
Please read handouts provided. High fiber diet.  Continue present medications. Await pathology results. Return to GI clinic as needed.      YOU HAD AN ENDOSCOPIC PROCEDURE TODAY AT [facility]:   Refer to the procedure report that was given to you for any specific questions about what was found during the examination.  If the procedure report does not answer your questions, please call your gastroenterologist to clarify.  If you requested that your care partner not be given the details of your procedure findings, then the procedure report has been included in a sealed envelope for you to review at your convenience later.  YOU SHOULD EXPECT: Some feelings of bloating in the abdomen. Passage of more gas than usual.  Walking can help get rid of the air that was put into your GI tract during the procedure and reduce the bloating. If you had a lower endoscopy (such as a colonoscopy or flexible sigmoidoscopy) you may notice spotting of blood in your stool or on the toilet paper. If you underwent a bowel prep for your procedure, you may not have a normal bowel movement for a few days.  Please Note:  You might notice some irritation and congestion in your nose or some drainage.  This is from the oxygen used during your procedure.  There is no need for concern and it should clear up in a day or so.  SYMPTOMS TO REPORT IMMEDIATELY:   Following lower endoscopy (colonoscopy or flexible sigmoidoscopy):  Excessive amounts of blood in the stool  Significant tenderness or worsening of abdominal pains  Swelling of the abdomen that is new, acute  Fever of 100F or higher    For urgent or emergent issues, a gastroenterologist can be reached at any hour by calling [phone]. Do not use MyChart messaging for urgent concerns.    DIET:  We do recommend a small meal at first, but then you may proceed to your regular diet.  Drink plenty of fluids but you should avoid alcoholic beverages  for 24 hours.  ACTIVITY:  You should plan to take it easy for the rest of today and you should NOT DRIVE or use heavy machinery until tomorrow (because of the sedation medicines used during the test).    FOLLOW UP: Our staff will call the number listed on your records 48-72 hours following your procedure to check on you and address any questions or concerns that you may have regarding the information given to you following your procedure. If we do not reach you, we will leave a message.  We will attempt to reach you two times.  During this call, we will ask if you have developed any symptoms of COVID 19. If you develop any symptoms (ie: fever, flu-like symptoms, shortness of breath, cough etc.) before then, please call [phone].  If you test positive for Covid 19 in the 2 weeks post procedure, please call and report this information to [place].    If any biopsies were taken you will be contacted by phone or by letter within the next 1-3 weeks.  Please call us at [phone] if you have not heard about the biopsies in 3 weeks.    SIGNATURES/CONFIDENTIALITY: You and/or your care partner have signed paperwork which will be entered into your electronic medical record.  These signatures attest to the fact that that the information above on your After Visit Summary has been reviewed and is understood.  Full responsibility of the confidentiality of this  discharge information lies with you and/or your care-partner. 

## 2019-10-13 ENCOUNTER — Telehealth: Payer: Self-pay

## 2019-10-13 ENCOUNTER — Telehealth: Payer: Self-pay | Admitting: *Deleted

## 2019-10-13 NOTE — Telephone Encounter (Signed)
NO ANSWER, MESSAGE LEFT FOR PATIENT. 

## 2019-10-13 NOTE — Telephone Encounter (Signed)
  Follow up Call-  Call back number 10/11/2019  Post procedure Call Back phone  # [phone]  Permission to leave phone message Yes  Some recent data might be hidden     No answer at # given. LM on VM.

## 2019-10-16 ENCOUNTER — Encounter: Payer: Self-pay | Admitting: Gastroenterology

## 2019-10-24 ENCOUNTER — Inpatient Hospital Stay (HOSPITAL_BASED_OUTPATIENT_CLINIC_OR_DEPARTMENT_OTHER)
Admission: EM | Admit: 2019-10-24 | Discharge: 2019-10-26 | DRG: 440 | Disposition: A | Discharge status: Home / Self Care | Payer: 59 | Attending: Internal Medicine | Admitting: Internal Medicine

## 2019-10-24 ENCOUNTER — Emergency Department (HOSPITAL_BASED_OUTPATIENT_CLINIC_OR_DEPARTMENT_OTHER): Payer: 59

## 2019-10-24 ENCOUNTER — Other Ambulatory Visit: Payer: Self-pay

## 2019-10-24 ENCOUNTER — Encounter (HOSPITAL_BASED_OUTPATIENT_CLINIC_OR_DEPARTMENT_OTHER): Payer: Self-pay | Admitting: Emergency Medicine

## 2019-10-24 DIAGNOSIS — I10 Essential (primary) hypertension: Secondary | ICD-10-CM | POA: Diagnosis present

## 2019-10-24 DIAGNOSIS — F1010 Alcohol abuse, uncomplicated: Secondary | ICD-10-CM | POA: Diagnosis present

## 2019-10-24 DIAGNOSIS — E1165 Type 2 diabetes mellitus with hyperglycemia: Secondary | ICD-10-CM | POA: Diagnosis not present

## 2019-10-24 DIAGNOSIS — F17210 Nicotine dependence, cigarettes, uncomplicated: Secondary | ICD-10-CM | POA: Diagnosis present

## 2019-10-24 DIAGNOSIS — R948 Abnormal results of function studies of other organs and systems: Secondary | ICD-10-CM

## 2019-10-24 DIAGNOSIS — R079 Chest pain, unspecified: Secondary | ICD-10-CM | POA: Diagnosis not present

## 2019-10-24 DIAGNOSIS — Z8041 Family history of malignant neoplasm of ovary: Secondary | ICD-10-CM

## 2019-10-24 DIAGNOSIS — Z7984 Long term (current) use of oral hypoglycemic drugs: Secondary | ICD-10-CM

## 2019-10-24 DIAGNOSIS — Z7982 Long term (current) use of aspirin: Secondary | ICD-10-CM

## 2019-10-24 DIAGNOSIS — R072 Precordial pain: Secondary | ICD-10-CM | POA: Diagnosis not present

## 2019-10-24 DIAGNOSIS — K859 Acute pancreatitis without necrosis or infection, unspecified: Secondary | ICD-10-CM

## 2019-10-24 DIAGNOSIS — E785 Hyperlipidemia, unspecified: Secondary | ICD-10-CM | POA: Diagnosis present

## 2019-10-24 DIAGNOSIS — K852 Alcohol induced acute pancreatitis without necrosis or infection: Secondary | ICD-10-CM | POA: Diagnosis not present

## 2019-10-24 DIAGNOSIS — Z20822 Contact with and (suspected) exposure to covid-19: Secondary | ICD-10-CM | POA: Diagnosis present

## 2019-10-24 DIAGNOSIS — E11649 Type 2 diabetes mellitus with hypoglycemia without coma: Secondary | ICD-10-CM | POA: Diagnosis not present

## 2019-10-24 DIAGNOSIS — D739 Disease of spleen, unspecified: Secondary | ICD-10-CM | POA: Diagnosis not present

## 2019-10-24 DIAGNOSIS — E7800 Pure hypercholesterolemia, unspecified: Secondary | ICD-10-CM | POA: Diagnosis present

## 2019-10-24 DIAGNOSIS — R110 Nausea: Secondary | ICD-10-CM | POA: Diagnosis not present

## 2019-10-24 DIAGNOSIS — J984 Other disorders of lung: Secondary | ICD-10-CM | POA: Diagnosis not present

## 2019-10-24 LAB — COMPREHENSIVE METABOLIC PANEL
ALT: 31 U/L (ref 0–44)
AST: 53 U/L — ABNORMAL HIGH (ref 15–41)
Albumin: 4.2 g/dL (ref 3.5–5.0)
Alkaline Phosphatase: 122 U/L (ref 38–126)
Anion gap: 10 (ref 5–15)
BUN: 17 mg/dL (ref 6–20)
CO2: 24 mmol/L (ref 22–32)
Calcium: 9.5 mg/dL (ref 8.9–10.3)
Chloride: 100 mmol/L (ref 98–111)
Creatinine, Ser: 0.69 mg/dL (ref 0.44–1.00)
GFR calc Af Amer: >60 mL/min (ref >60)
GFR calc non Af Amer: >60 mL/min (ref >60)
Glucose, Bld: 315 mg/dL — ABNORMAL HIGH (ref 70–99)
Potassium: 3.9 mmol/L (ref 3.5–5.1)
Sodium: 134 mmol/L — ABNORMAL LOW (ref 135–145)
Total Bilirubin: 0.9 mg/dL (ref 0.3–1.2)
Total Protein: 8.4 g/dL — ABNORMAL HIGH (ref 6.5–8.1)

## 2019-10-24 LAB — I-STAT VENOUS BLOOD GAS, ED
Acid-base deficit: 1 mmol/L (ref 0.0–2.0)
Bicarbonate: 24.1 mmol/L (ref 20.0–28.0)
Calcium, Ion: 1.33 mmol/L (ref 1.15–1.40)
HCT: 37 % (ref 36.0–46.0)
Hemoglobin: 12.6 g/dL (ref 12.0–15.0)
O2 Saturation: 86 %
Patient temperature: 98.7
Potassium: 3.8 mmol/L (ref 3.5–5.1)
Sodium: 137 mmol/L (ref 135–145)
TCO2: 25 mmol/L (ref 22–32)
pCO2, Ven: 40.7 mmHg — ABNORMAL LOW (ref 44.0–60.0)
pH, Ven: 7.38 (ref 7.250–7.430)
pO2, Ven: 52 mmHg — ABNORMAL HIGH (ref 32.0–45.0)

## 2019-10-24 LAB — CBC
HCT: 38.1 % (ref 36.0–46.0)
Hemoglobin: 13.1 g/dL (ref 12.0–15.0)
MCH: 30.2 pg (ref 26.0–34.0)
MCHC: 34.4 g/dL (ref 30.0–36.0)
MCV: 87.8 fL (ref 80.0–100.0)
Platelets: 280 10*3/uL (ref 150–400)
RBC: 4.34 MIL/uL (ref 3.87–5.11)
RDW: 12.2 % (ref 11.5–15.5)
WBC: 6.1 10*3/uL (ref 4.0–10.5)
nRBC: 0 % (ref 0.0–0.2)

## 2019-10-24 LAB — LIPID PANEL
Cholesterol: 153 mg/dL (ref 0–200)
HDL: 48 mg/dL (ref >40)
LDL Cholesterol: 85 mg/dL (ref 0–99)
Total CHOL/HDL Ratio: 3.2 RATIO
Triglycerides: 102 mg/dL (ref <150)
VLDL: 20 mg/dL (ref 0–40)

## 2019-10-24 LAB — SARS CORONAVIRUS 2 BY RT PCR (HOSPITAL ORDER, PERFORMED IN ~~LOC~~ HOSPITAL LAB): SARS Coronavirus 2: NEGATIVE

## 2019-10-24 LAB — GLUCOSE, CAPILLARY
Glucose-Capillary: 148 mg/dL — ABNORMAL HIGH (ref 70–99)
Glucose-Capillary: 92 mg/dL (ref 70–99)

## 2019-10-24 LAB — CBG MONITORING, ED: Glucose-Capillary: 285 mg/dL — ABNORMAL HIGH (ref 70–99)

## 2019-10-24 LAB — LIPASE, BLOOD: Lipase: 1089 U/L — ABNORMAL HIGH (ref 11–51)

## 2019-10-24 LAB — TROPONIN I (HIGH SENSITIVITY)
Troponin I (High Sensitivity): 4 ng/L (ref <18)
Troponin I (High Sensitivity): 4 ng/L (ref <18)

## 2019-10-24 LAB — HIV ANTIBODY (ROUTINE TESTING W REFLEX): HIV Screen 4th Generation wRfx: NONREACTIVE

## 2019-10-24 LAB — HEMOGLOBIN A1C
Hgb A1c MFr Bld: 14.3 % — ABNORMAL HIGH (ref 4.8–5.6)
Mean Plasma Glucose: 363.71 mg/dL

## 2019-10-24 MED ORDER — THIAMINE HCL 100 MG PO TABS
100.0000 mg | ORAL_TABLET | Freq: Every day | ORAL | Status: DC
  Administered 2019-10-25 – 2019-10-26 (×2): 100 mg via ORAL
  Filled 2019-10-24 (×3): qty 1

## 2019-10-24 MED ORDER — HYOSCYAMINE SULFATE 0.125 MG SL SUBL
0.2500 mg | SUBLINGUAL_TABLET | Freq: Once | SUBLINGUAL | Status: AC
  Administered 2019-10-24: 0.25 mg via SUBLINGUAL
  Filled 2019-10-24: qty 2

## 2019-10-24 MED ORDER — ADULT MULTIVITAMIN W/MINERALS CH
1.0000 | ORAL_TABLET | Freq: Every day | ORAL | Status: DC
  Administered 2019-10-25 – 2019-10-26 (×2): 1 via ORAL
  Filled 2019-10-24 (×3): qty 1

## 2019-10-24 MED ORDER — LACTATED RINGERS IV SOLN: Freq: Once | INTRAVENOUS | Status: AC

## 2019-10-24 MED ORDER — INSULIN ASPART 100 UNIT/ML ~~LOC~~ SOLN
0.0000 [IU] | Freq: Three times a day (TID) | SUBCUTANEOUS | Status: DC
  Administered 2019-10-24: 2 [IU] via SUBCUTANEOUS
  Administered 2019-10-25: 100 [IU] via SUBCUTANEOUS
  Administered 2019-10-26: 11 [IU] via SUBCUTANEOUS
  Administered 2019-10-26: 8 [IU] via SUBCUTANEOUS

## 2019-10-24 MED ORDER — HEPARIN SODIUM (PORCINE) 5000 UNIT/ML IJ SOLN
5000.0000 [IU] | Freq: Three times a day (TID) | INTRAMUSCULAR | Status: DC
  Administered 2019-10-24 – 2019-10-26 (×5): 5000 [IU] via SUBCUTANEOUS
  Filled 2019-10-24 (×5): qty 1

## 2019-10-24 MED ORDER — IOHEXOL 300 MG/ML  SOLN
100.0000 mL | Freq: Once | INTRAMUSCULAR | Status: AC | PRN
  Administered 2019-10-24: 100 mL via INTRAVENOUS

## 2019-10-24 MED ORDER — ALUM & MAG HYDROXIDE-SIMETH 200-200-20 MG/5ML PO SUSP
30.0000 mL | Freq: Once | ORAL | Status: AC
  Administered 2019-10-24: 30 mL via ORAL
  Filled 2019-10-24: qty 30

## 2019-10-24 MED ORDER — INSULIN ASPART 100 UNIT/ML ~~LOC~~ SOLN: 0.0000 [IU] | Freq: Every day | SUBCUTANEOUS | Status: DC

## 2019-10-24 MED ORDER — FOLIC ACID 1 MG PO TABS
1.0000 mg | ORAL_TABLET | Freq: Every day | ORAL | Status: DC
  Administered 2019-10-25 – 2019-10-26 (×2): 1 mg via ORAL
  Filled 2019-10-24 (×3): qty 1

## 2019-10-24 MED ORDER — LIDOCAINE VISCOUS HCL 2 % MT SOLN
15.0000 mL | Freq: Once | OROMUCOSAL | Status: AC
  Administered 2019-10-24: 15 mL via ORAL
  Filled 2019-10-24: qty 15

## 2019-10-24 NOTE — ED Notes (Signed)
ED Provider at bedside. 

## 2019-10-24 NOTE — ED Provider Notes (Signed)
[facility] EMERGENCY DEPARTMENT Provider Note  CSN: [id number] Arrival date & time: 10/24/19 0543  Chief Complaint(s) Chest Pain  HPI Sally Mclaughlin is a 54 y.o. female   The history is provided by the patient.  Chest Pain Pain location:  Substernal area and R chest Pain quality: aching and pressure   Pain radiates to:  Epigastrium Pain severity:  Moderate Onset quality:  Gradual Duration:  3 hours Timing:  Constant Progression:  Improving Chronicity:  New Relieved by: took 239m ibuprofen. Worsened by:  Nothing Associated symptoms: nausea   Associated symptoms: no abdominal pain, no anorexia, no back pain, no cough, no fatigue, no fever, no heartburn, no shortness of breath and no vomiting   Risk factors: diabetes mellitus, high cholesterol, hypertension and smoking    She reports that she stopped taking her metformin because "it was killing people." Still taking Jardiance, lisinopril/HCTZ, and Lipitor.  Past Medical History Past Medical History:  Diagnosis Date  . Diabetes mellitus without complication [place]   . Hypercholesteremia   . Hypertension    Patient Active Problem List   Diagnosis Date Noted  . Cortical age-related cataract of both eyes 09/26/2019  . Dermatochalasis of both upper eyelids 09/26/2019  . Suprapubic abscess 09/06/2019  . Hidradenitis axillaris 08/18/2018  . Pain in joint, ankle and foot 08/18/2018  . Epigastric pain 06/15/2018  . Carpal tunnel syndrome 11/12/2015  . TOBACCO USER 02/23/2009  . Hyperlipidemia 07/23/2008  . Diabetes mellitus type 2, uncontrolled ([place] 05/14/2008  . Overweight 07/15/2006  . Alcohol abuse, unspecified 07/15/2006  . Essential hypertension 07/15/2006   Home Medication(s) Prior to Admission medications   Medication Sig Start Date End Date Taking? Authorizing Provider  acetaminophen (TYLENOL) 500 MG tablet Take 500 mg by mouth every 6 (six) hours as needed.      [provider]  aspirin 81 MG  tablet Take 1 tablet (81 mg total) by mouth daily. 12/18/16   WEloise Levels MD  atorvastatin (LIPITOR) 80 MG tablet Take 1 tablet (80 mg total) by mouth daily. 09/07/19   RRory Percy DO  Blood Glucose Monitoring Suppl (FREESTYLE LITE) DEVI 1 kit by Does not apply route daily. 09/07/19   RRory Percy DO  calcium carbonate (TUMS - DOSED IN MG ELEMENTAL CALCIUM) 500 MG chewable tablet Chew 1 tablet (200 mg of elemental calcium total) by mouth 3 (three) times daily as needed for indigestion or heartburn. 06/15/18   RSteve Rattler DO  empagliflozin (JARDIANCE) 25 MG TABS tablet Take 25 mg by mouth daily. 09/07/19   RRory Percy DO  glucose blood (FREESTYLE LITE) test strip Use as instructed 09/07/19   RRory Percy DO  Lancets (FREESTYLE) lancets Use as instructed 09/07/19   RRory Percy DO  lisinopril-hydrochlorothiazide (ZESTORETIC) 20-12.5 MG tablet Take 1 tablet by mouth daily. 09/07/19   RRory Percy DO  metFORMIN (GLUCOPHAGE) 500 MG tablet Take 2 tablets (1,000 mg total) by mouth 2 (two) times daily with a meal. 09/07/19   RRory Percy DO  Past Surgical History Past Surgical History:  Procedure Laterality Date  . HERNIA REPAIR     Family History Family History  Problem Relation Age of Onset  . Ovarian cancer Sister   . Colon cancer Neg Hx   . Colon polyps Neg Hx   . Esophageal cancer Neg Hx   . Stomach cancer Neg Hx   . Rectal cancer Neg Hx     Social History Social History   Tobacco Use  . Smoking status: Light Tobacco Smoker    Types: Cigarettes  . Smokeless tobacco: Never Used  Substance Use Topics  . Alcohol use: Yes    Comment: social  . Drug use: No   Allergies Patient has no known allergies.  Review of Systems Review of Systems  Constitutional: Negative for fatigue and fever.  Respiratory: Negative for cough and  shortness of breath.   Cardiovascular: Positive for chest pain.  Gastrointestinal: Positive for nausea. Negative for abdominal pain, anorexia, heartburn and vomiting.  Musculoskeletal: Negative for back pain.   All other systems are reviewed and are negative for acute change except as noted in the HPI  Physical Exam Vital Signs  I have reviewed the triage vital signs BP (!) 173/87 (BP Location: Right Arm)   Pulse 77   Temp 98.7 F (37.1 C) (Oral)   Resp 12   Ht _0  (1.676 m)   Wt 77.1 kg   LMP 11/25/2012   SpO2 100%   BMI 27.44 kg/m   Physical Exam Vitals reviewed.  Constitutional:      General: She is not in acute distress.    Appearance: She is well-developed. She is not diaphoretic.  HENT:     Head: Normocephalic and atraumatic.     Nose: Nose normal.  Eyes:     General: No scleral icterus.       Right eye: No discharge.        Left eye: No discharge.     Conjunctiva/sclera: Conjunctivae normal.     Pupils: Pupils are equal, round, and reactive to light.  Cardiovascular:     Rate and Rhythm: Normal rate and regular rhythm.     Heart sounds: Murmur present. Systolic (right 2nd IC space) murmur present with a grade of 1/6. No friction rub. No gallop.   Pulmonary:     Effort: Pulmonary effort is normal. No respiratory distress.     Breath sounds: Normal breath sounds. No stridor. No rales.  Abdominal:     General: There is no distension.     Palpations: Abdomen is soft.     Tenderness: There is no abdominal tenderness.  Musculoskeletal:        General: No tenderness.     Cervical back: Normal range of motion and neck supple.  Skin:    General: Skin is warm and dry.     Findings: No erythema or rash.  Neurological:     Mental Status: She is alert and oriented to person, place, and time.     ED Results and Treatments Labs (all labs ordered are listed, but only abnormal results are displayed) Labs Reviewed  COMPREHENSIVE METABOLIC PANEL - Abnormal; Notable  for the following components:      Result Value   Sodium 134 (*)    Glucose, Bld 315 (*)    Total Protein 8.4 (*)    AST 53 (*)    All other components within normal limits  LIPASE, BLOOD - Abnormal; Notable for the following components:  Lipase 1,089 (*)    All other components within normal limits  CBG MONITORING, ED - Abnormal; Notable for the following components:   Glucose-Capillary 285 (*)    All other components within normal limits  I-STAT VENOUS BLOOD GAS, ED - Abnormal; Notable for the following components:   pCO2, Ven 40.7 (*)    pO2, Ven 52.0 (*)    All other components within normal limits  CBC  BLOOD GAS, VENOUS  TROPONIN I (HIGH SENSITIVITY)                                                                                                                         EKG  EKG Interpretation  Date/Time:  Tuesday October 24 2019 06:02:21 EDT Ventricular Rate:  71 PR Interval:    QRS Duration: 86 QT Interval:  400 QTC Calculation: 435 R Axis:   29 Text Interpretation: Sinus rhythm Anterior infarct, old inverted p wave resolved. NO STEMI. Otherwise no significant change Confirmed by Addison Lank [phone]) on 10/24/2019 6:10:12 AM      Radiology DG Chest 2 View  Result Date: 10/24/2019 CLINICAL DATA:  Chest pain EXAM: CHEST - 2 VIEW COMPARISON:  None. FINDINGS: Normal heart size and mediastinal contours. No acute infiltrate or edema. No effusion or pneumothorax. Degenerative endplate spurring no acute osseous findings. IMPRESSION: No active cardiopulmonary disease. Electronically Signed   By: Monte Fantasia M.D.   On: 10/24/2019 06:36    Pertinent labs & imaging results that were available during my care of the patient were reviewed by me and considered in my medical decision making (see chart for details).  Medications Ordered in ED Medications  alum & mag hydroxide-simeth (MAALOX/MYLANTA) 200-200-20 MG/5ML suspension 30 mL (30 mLs Oral Given 10/24/19 2778)    And    lidocaine (XYLOCAINE) 2 % viscous mouth solution 15 mL (15 mLs Oral Given 10/24/19 2423)  hyoscyamine (LEVSIN SL) SL tablet 0.25 mg (0.25 mg Sublingual Given 10/24/19 5361)                                                                                                                                    Procedures Procedures  (including critical care time)  Medical Decision Making / ED Course I have reviewed the nursing notes for this encounter and the patient's prior records (if available in EHR or on provided paperwork).   CADI RHINEHART was evaluated  in Emergency Department on 10/24/2019 for the symptoms described in the history of present illness. She was evaluated in the context of the global COVID-19 pandemic, which necessitated consideration that the patient might be at risk for infection with the SARS-CoV-2 virus that causes COVID-19. Institutional protocols and algorithms that pertain to the evaluation of patients at risk for COVID-19 are in a state of rapid change based on information released by regulatory bodies including the CDC and federal and state organizations. These policies and algorithms were followed during the patient's care in the ED.  Atypical chest pain. EKG w/o acute ischemic changes or evidence of pericarditis. HEART 4 Initial trop negative  Low suspicion for PE, dissection, or esophageal perforation. Chest x-ray without evidence suggestive of pneumonia, pneumothorax, pneumomediastinum.  No abnormal contour of the mediastinum to suggest dissection. No evidence of acute injuries.  CBC w/o leukocytosis or anemia. CBG 285 and VBG w/o acidosis - no in DKA. CMP with no significant electrolyte derangements, renal insufficiency or biliary obstruction.   Given GI cocktail for possible GERD. Immediate resolution of her pain with GI cocktail. Likely the cause. But given her co-morbidities, will obtain delta trop.  Lipase >1000. Will get CT.  Patient care turned over to Dr  Stark Jock. Patient case and results discussed in detail; please see their note for further ED managment.          Final Clinical Impression(s) / ED Diagnoses Final diagnoses:  Chest pain      This chart was dictated using voice recognition software.  Despite best efforts to proofread,  errors can occur which can change the documentation meaning.   Fatima Blank, MD 10/24/19 [phone]

## 2019-10-24 NOTE — ED Notes (Signed)
Pt returned from CT °

## 2019-10-24 NOTE — ED Notes (Signed)
Thomas at [facility] called for hospitalist consult.

## 2019-10-24 NOTE — ED Provider Notes (Signed)
Care assumed from Dr. Leonette Monarch at shift change.  Patient is a 54 year old female with history of hypertension, hyperlipidemia, and diabetes.  She presented here originally with epigastric/lower chest pain.  Cardiac work-up was unremarkable, but lipase is elevated at nearly 1100.  Care signed out to me awaiting results of a CT scan.  This has been performed and shows no evidence for cholecystitis or ductal dilatation.  Her LFTs are normal.  I am uncertain as to the etiology of her pancreatitis.  Patient does consume alcohol on the weekends, but denies excessive consumption.  She does not appear to have a gallstone as the etiology.    At this point, I feel as though admission for hydration and pain control is appropriate.  I spoke with Dr. Avon Gully at [facility] who agrees to accept in transfer.   Veryl Speak, MD 10/24/19 [phone]

## 2019-10-24 NOTE — Progress Notes (Signed)
Patient has arrived and in room from [facility]. AMION page sent to Dr. Avon Gully.

## 2019-10-24 NOTE — H&P (Signed)
History and Physical    DELLAMAE ROSAMILIA IRS:[id number] DOB: [birth year] DOA: 10/24/2019  PCP: Rory Percy, DO   Chief Complaint: Epigastric pain  HPI: Sally Mclaughlin is a 54 y.o. female with medical history significant of non-insulin-dependent diabetes mellitus type 2, hyperlipidemia, hypertension, EtOH use/abuse.  Patient presents to the ED after 48 hours of epigastric pain with nausea but no vomiting.  Patient admits to recent alcohol usage, she drinks upwards of 6 beers, does not drink daily.  Denies any recent travel, sick contacts, dietary changes although she does indicate recently stopping her Metformin due to something she read online.  ED Course: In the ED patient had GI cocktail with nearly immediate improvement in symptoms, CT abdomen pelvis markable for acute mild pancreatitis given inflammation at the pancreatic head and uncinate process without free fluid abscess or peripancreatic fluid collection.  Lipase elevated at greater than 1000, given patient's findings and imaging hospitalist was called for admission.  Review of Systems: As per HPI denies chest pain, shortness of breath, diarrhea, constipation, headache, fevers, chills..   Assessment/Plan Active Problems:   Pancreatitis, alcoholic, acute   Pancreatitis, acute, likely 2/2 recent alcohol use  - CT as below remarkable for inflammation of the pancreatic head and uncinate process  - She indicates ongoing alcohol use -likely the most recent cause at this point  - Other risk factors include possible medication side effects; metformin, statin, jiardiance, lisinopril very low risk; HCTZ carries a moderate risk given calcium resorption increase especially while on exogenous calcium.   - Follow lipid panel  NIDDM2 - Continue sliding scale insulin, hypoglycemic protocol - Hold home PO meds given NPO status as above and concern for medication induced pancreatitis  HTN, essential - Continue home meds  DVT prophylaxis:  Heparin Code Status: Full Family Communication: None present Status is: Inpatient  Dispo: The patient is from: Home              Anticipated d/c is to: Home              Anticipated d/c date is: 48 to 72 hours pending clinical course              Patient currently not medically stable for discharge due to ongoing need for IV fluids, poor p.o. intake in the setting of acute pancreatitis, close monitoring required for at least an additional 24 to 48 hours given high risk for decompensation  Consultants:   None  Procedures:   None planned   Past Medical History:  Diagnosis Date  . Diabetes mellitus without complication ([place])   . Hypercholesteremia   . Hypertension     Past Surgical History:  Procedure Laterality Date  . HERNIA REPAIR       reports that she has been smoking cigarettes. She has never used smokeless tobacco. She reports current alcohol use. She reports that she does not use drugs.  No Known Allergies  Family History  Problem Relation Age of Onset  . Ovarian cancer Sister   . Colon cancer Neg Hx   . Colon polyps Neg Hx   . Esophageal cancer Neg Hx   . Stomach cancer Neg Hx   . Rectal cancer Neg Hx     Prior to Admission medications   Medication Sig Start Date End Date Taking? Authorizing Provider  acetaminophen (TYLENOL) 500 MG tablet Take 500 mg by mouth every 6 (six) hours as needed.      [provider]  aspirin 81 MG tablet  Take 1 tablet (81 mg total) by mouth daily. 12/18/16   Eloise Levels, MD  atorvastatin (LIPITOR) 80 MG tablet Take 1 tablet (80 mg total) by mouth daily. 09/07/19   Rory Percy, DO  Blood Glucose Monitoring Suppl (FREESTYLE LITE) DEVI 1 kit by Does not apply route daily. 09/07/19   Rory Percy, DO  calcium carbonate (TUMS - DOSED IN MG ELEMENTAL CALCIUM) 500 MG chewable tablet Chew 1 tablet (200 mg of elemental calcium total) by mouth 3 (three) times daily as needed for indigestion or heartburn. 06/15/18   Steve Rattler, DO  empagliflozin (JARDIANCE) 25 MG TABS tablet Take 25 mg by mouth daily. 09/07/19   Rory Percy, DO  glucose blood (FREESTYLE LITE) test strip Use as instructed 09/07/19   Rory Percy, DO  Lancets (FREESTYLE) lancets Use as instructed 09/07/19   Rory Percy, DO  lisinopril-hydrochlorothiazide (ZESTORETIC) 20-12.5 MG tablet Take 1 tablet by mouth daily. 09/07/19   Rory Percy, DO  metFORMIN (GLUCOPHAGE) 500 MG tablet Take 2 tablets (1,000 mg total) by mouth 2 (two) times daily with a meal. 09/07/19   Rory Percy, DO    Physical Exam: Vitals:   10/24/19 1000 10/24/19 1055 10/24/19 1300 10/24/19 1448  BP: (!) 154/84 (!) 139/92 (!) 145/82 (!) 141/88  Pulse: 64 73 65 (!) 59  Resp: '11 16 14 16  '$ Temp:    98.2 F (36.8 C)  TempSrc:    Oral  SpO2: 99% 99% 98% 100%  Weight:      Height:        Constitutional: NAD, calm, comfortable Vitals:   10/24/19 1000 10/24/19 1055 10/24/19 1300 10/24/19 1448  BP: (!) 154/84 (!) 139/92 (!) 145/82 (!) 141/88  Pulse: 64 73 65 (!) 59  Resp: '11 16 14 16  '$ Temp:    98.2 F (36.8 C)  TempSrc:    Oral  SpO2: 99% 99% 98% 100%  Weight:      Height:       General:  Pleasantly resting in bed, No acute distress. HEENT:  Normocephalic atraumatic.  Sclerae nonicteric, noninjected.  Extraocular movements intact bilaterally. Neck:  Without mass or deformity.  Trachea is midline. Lungs:  Clear to auscultate bilaterally without rhonchi, wheeze, or rales. Heart:  Regular rate and rhythm.  Without murmurs, rubs, or gallops. Abdomen: Under the midepigastrium, otherwise without rebound or guarding. Extremities: Without cyanosis, clubbing, edema, or obvious deformity. Vascular:  Dorsalis pedis and posterior tibial pulses palpable bilaterally. Skin:  Warm and dry, no erythema, no ulcerations.  Labs on Admission: I have personally reviewed following labs and imaging studies  CBC: Recent Labs  Lab 10/24/19 0555 10/24/19 0636  WBC 6.1  --    HGB 13.1 12.6  HCT 38.1 37.0  MCV 87.8  --   PLT 280  --    Basic Metabolic Panel: Recent Labs  Lab 10/24/19 0555 10/24/19 0636  NA 134* 137  K 3.9 3.8  CL 100  --   CO2 24  --   GLUCOSE 315*  --   BUN 17  --   CREATININE 0.69  --   CALCIUM 9.5  --    GFR: Estimated Creatinine Clearance: 82.3 mL/min (by C-G formula based on SCr of 0.69 mg/dL). Liver Function Tests: Recent Labs  Lab 10/24/19 0555  AST 53*  ALT 31  ALKPHOS 122  BILITOT 0.9  PROT 8.4*  ALBUMIN 4.2   Recent Labs  Lab 10/24/19 0555  LIPASE 1,089*   No results  for input(s): AMMONIA in the last 168 hours. Coagulation Profile: No results for input(s): INR, PROTIME in the last 168 hours. Cardiac Enzymes: No results for input(s): CKTOTAL, CKMB, CKMBINDEX, TROPONINI in the last 168 hours. BNP (last 3 results) No results for input(s): PROBNP in the last 8760 hours. HbA1C: No results for input(s): HGBA1C in the last 72 hours. CBG: Recent Labs  Lab 10/24/19 0633  GLUCAP 285*   Lipid Profile: No results for input(s): CHOL, HDL, LDLCALC, TRIG, CHOLHDL, LDLDIRECT in the last 72 hours. Thyroid Function Tests: No results for input(s): TSH, T4TOTAL, FREET4, T3FREE, THYROIDAB in the last 72 hours. Anemia Panel: No results for input(s): VITAMINB12, FOLATE, FERRITIN, TIBC, IRON, RETICCTPCT in the last 72 hours. Urine analysis:    Component Value Date/Time   BILIRUBINUR NEGATIVE 03/28/2013 0906   PROTEINUR 100 03/28/2013 0906   UROBILINOGEN 0.2 03/28/2013 0906   NITRITE NEGATIVE 03/28/2013 0906   LEUKOCYTESUR Negative 03/28/2013 0906    Radiological Exams on Admission: DG Chest 2 View  Result Date: 10/24/2019 CLINICAL DATA:  Chest pain EXAM: CHEST - 2 VIEW COMPARISON:  None. FINDINGS: Normal heart size and mediastinal contours. No acute infiltrate or edema. No effusion or pneumothorax. Degenerative endplate spurring no acute osseous findings. IMPRESSION: No active cardiopulmonary disease.  Electronically Signed   By: Monte Fantasia M.D.   On: 10/24/2019 06:36   CT ABDOMEN PELVIS W CONTRAST  Result Date: 10/24/2019 CLINICAL DATA:  Nausea and vomiting, pancreatitis EXAM: CT ABDOMEN AND PELVIS WITH CONTRAST TECHNIQUE: Multidetector CT imaging of the abdomen and pelvis was performed using the standard protocol following bolus administration of intravenous contrast. CONTRAST:  150m OMNIPAQUE IOHEXOL 300 MG/ML  SOLN COMPARISON:  None. FINDINGS: Lower chest: No acute abnormality. Hepatobiliary: No focal liver abnormality is seen. No gallstones, gallbladder wall thickening, or biliary dilatation. Pancreas: Mildly edematous appearance of the pancreatic head and uncinate process with subtle adjacent peripancreatic fat stranding (series 2, images 34-37). No free fluid or peripancreatic fluid collection. Pancreatic body and tail are normal in appearance. No pancreatic ductal dilatation. Spleen: Multiple rounded ill-defined areas of decreased attenuation scattered throughout the spleen. Spleen is normal in size. Adrenals/Urinary Tract: Adrenal glands are unremarkable. Kidneys are normal, without renal calculi, focal lesion, or hydronephrosis. Bladder is unremarkable. Stomach/Bowel: Stomach is within normal limits. Appendix appears normal (series 2, image 58). No evidence of bowel wall thickening, distention, or inflammatory changes. Vascular/Lymphatic: No significant vascular findings are present. No enlarged abdominal or pelvic lymph nodes. Reproductive: Multi fibroid uterus.  No adnexal mass. Other: No free fluid or abdominopelvic fluid collection. No pneumoperitoneum. Prior ventral abdominal wall repair. No abdominal wall hernia. Musculoskeletal: Degenerative changes of the bilateral SI joints, right slightly greater than left. No acute osseous findings. IMPRESSION: 1. Findings suggestive of mild acute pancreatitis involving the pancreatic head and uncinate process. No free fluid or peripancreatic fluid  collection. 2. Multiple rounded ill-defined areas of decreased attenuation scattered throughout the spleen. Findings are nonspecific with differential including splenic hemangiomata, metastatic disease, lymphoma, versus infection. Perfusion artifact mimicking multiple splenic masses is also possibility. Initial evaluation with left upper quadrant ultrasound is recommended. 3. Multi fibroid uterus. Electronically Signed   By: NDavina PokeD.O.   On: 10/24/2019 08:18    EKG: Independently reviewed.  Normal sinus rhythm  WLittle IshikawaDO [facility] For contact please use secure messenger on Epic  If 7PM-7AM, please contact night-coverage located on [email or web address]   10/24/2019, 3:10 PM

## 2019-10-24 NOTE — ED Notes (Signed)
Patient transported to CT 

## 2019-10-24 NOTE — ED Notes (Signed)
Checked CBG 285, RN Lisa unformed

## 2019-10-24 NOTE — ED Notes (Signed)
Pt amb to BR w/o difficulty 

## 2019-10-24 NOTE — ED Triage Notes (Signed)
Pt states she started having chest pain this morning around 0230  Pt states she had just gotten off work went home and took a shower   Pt states the pain is on the right side of her chest  Pt took ibuprofen without relief   Pt has nausea without vomiting

## 2019-10-25 ENCOUNTER — Inpatient Hospital Stay (HOSPITAL_COMMUNITY): Payer: 59

## 2019-10-25 LAB — COMPREHENSIVE METABOLIC PANEL
ALT: 28 U/L (ref 0–44)
ALT: 27 U/L (ref 0–44)
AST: 26 U/L (ref 15–41)
AST: 24 U/L (ref 15–41)
Albumin: 4 g/dL (ref 3.5–5.0)
Albumin: 3.6 g/dL (ref 3.5–5.0)
Alkaline Phosphatase: 114 U/L (ref 38–126)
Alkaline Phosphatase: 102 U/L (ref 38–126)
Anion gap: 15 (ref 5–15)
Anion gap: 8 (ref 5–15)
BUN: 13 mg/dL (ref 6–20)
BUN: 14 mg/dL (ref 6–20)
CO2: 22 mmol/L (ref 22–32)
CO2: 26 mmol/L (ref 22–32)
Calcium: 9.5 mg/dL (ref 8.9–10.3)
Calcium: 9.3 mg/dL (ref 8.9–10.3)
Chloride: 102 mmol/L (ref 98–111)
Chloride: 104 mmol/L (ref 98–111)
Creatinine, Ser: 0.68 mg/dL (ref 0.44–1.00)
Creatinine, Ser: 0.76 mg/dL (ref 0.44–1.00)
GFR calc Af Amer: >60 mL/min (ref >60)
GFR calc Af Amer: >60 mL/min (ref >60)
GFR calc non Af Amer: >60 mL/min (ref >60)
GFR calc non Af Amer: >60 mL/min (ref >60)
Glucose, Bld: 86 mg/dL (ref 70–99)
Glucose, Bld: 131 mg/dL — ABNORMAL HIGH (ref 70–99)
Potassium: 4 mmol/L (ref 3.5–5.1)
Potassium: 3.9 mmol/L (ref 3.5–5.1)
Sodium: 139 mmol/L (ref 135–145)
Sodium: 138 mmol/L (ref 135–145)
Total Bilirubin: 1.2 mg/dL (ref 0.3–1.2)
Total Bilirubin: 1.1 mg/dL (ref 0.3–1.2)
Total Protein: 7.8 g/dL (ref 6.5–8.1)
Total Protein: 7.1 g/dL (ref 6.5–8.1)

## 2019-10-25 LAB — GLUCOSE, CAPILLARY
Glucose-Capillary: 117 mg/dL — ABNORMAL HIGH (ref 70–99)
Glucose-Capillary: 232 mg/dL — ABNORMAL HIGH (ref 70–99)
Glucose-Capillary: 67 mg/dL — ABNORMAL LOW (ref 70–99)
Glucose-Capillary: 294 mg/dL — ABNORMAL HIGH (ref 70–99)
Glucose-Capillary: 89 mg/dL (ref 70–99)
Glucose-Capillary: 74 mg/dL (ref 70–99)

## 2019-10-25 LAB — CBC
HCT: 39.6 % (ref 36.0–46.0)
HCT: 36.7 % (ref 36.0–46.0)
Hemoglobin: 13.2 g/dL (ref 12.0–15.0)
Hemoglobin: 12.6 g/dL (ref 12.0–15.0)
MCH: 29.9 pg (ref 26.0–34.0)
MCH: 30.7 pg (ref 26.0–34.0)
MCHC: 33.3 g/dL (ref 30.0–36.0)
MCHC: 34.3 g/dL (ref 30.0–36.0)
MCV: 89.8 fL (ref 80.0–100.0)
MCV: 89.3 fL (ref 80.0–100.0)
Platelets: 256 10*3/uL (ref 150–400)
Platelets: 236 10*3/uL (ref 150–400)
RBC: 4.41 MIL/uL (ref 3.87–5.11)
RBC: 4.11 MIL/uL (ref 3.87–5.11)
RDW: 12.5 % (ref 11.5–15.5)
RDW: 12.3 % (ref 11.5–15.5)
WBC: 5.7 10*3/uL (ref 4.0–10.5)
WBC: 4.9 10*3/uL (ref 4.0–10.5)
nRBC: 0 % (ref 0.0–0.2)
nRBC: 0 % (ref 0.0–0.2)

## 2019-10-25 LAB — PHOSPHORUS: Phosphorus: 2.9 mg/dL (ref 2.5–4.6)

## 2019-10-25 LAB — MAGNESIUM: Magnesium: 2 mg/dL (ref 1.7–2.4)

## 2019-10-25 MED ORDER — ONDANSETRON HCL 4 MG/2ML IJ SOLN
4.0000 mg | Freq: Four times a day (QID) | INTRAMUSCULAR | Status: DC | PRN
  Administered 2019-10-25: 4 mg via INTRAVENOUS
  Filled 2019-10-25: qty 2

## 2019-10-25 MED ORDER — LORAZEPAM 1 MG PO TABS: 1.0000 mg | ORAL_TABLET | ORAL | Status: DC | PRN

## 2019-10-25 MED ORDER — DEXTROSE 50 % IV SOLN
12.5000 g | INTRAVENOUS | Status: AC
  Administered 2019-10-25: 12.5 g via INTRAVENOUS
  Filled 2019-10-25: qty 50

## 2019-10-25 MED ORDER — LORAZEPAM 2 MG/ML IJ SOLN: 1.0000 mg | INTRAMUSCULAR | Status: DC | PRN

## 2019-10-25 MED ORDER — LACTATED RINGERS IV SOLN: Freq: Once | INTRAVENOUS | Status: AC

## 2019-10-25 NOTE — Progress Notes (Signed)
Inpatient Diabetes Program Recommendations  AACE/ADA: New Consensus Statement on Inpatient Glycemic Control (2015)  Target Ranges:  Prepandial:   less than 140 mg/dL      Peak postprandial:   less than 180 mg/dL (1-2 hours)      Critically ill patients:  140 - 180 mg/dL   Lab Results  Component Value Date   GLUCAP 294 (H) 10/25/2019   HGBA1C 14.3 (H) 10/24/2019    Review of Glycemic Control  Diabetes history: DM2 Outpatient Diabetes medications: metformin 1000 mg bid, Jardiance 25 mgQD Current orders for Inpatient glycemic control: Novolog 0-15 units tidwc and hs  HgbA1C - 14.3% - uncontrolled NPO at present  Inpatient Diabetes Program Recommendations:     If pt remains NPO, change Novolog to 0-15 units Q4H. When po intake resumes, will likely need basal + meal coverage.   May need to go home on insulin. Spoke to pt at bedside and discussed HgbA1C of 14.3%. Pt states she has a blood glucose meter at home, although does not use it. Discussed importance of controlling blood sugars to prevent long-term complications. Discussed diet, exercise, medications, stress and how these affect blood sugars. Demonstrated insulin pen administration and pt states she can use insulin pen if needed at home/work. Will f/u in am regarding possibly going home on insulin. Pt states she feels better about it, if she needs to start insulin at home, after seeing how easy the pen is.  Will f/u in am.   Thank you. Lorenda Peck, RD, LDN, CDE Inpatient Diabetes Coordinator [phone]

## 2019-10-25 NOTE — Progress Notes (Addendum)
Nurse Tech [place] notified me that patient CBG was 67. Initiated hypoglycemic protocol. Gave 12.5 g of dextrose in patient IV. CBG now 232. AMION page sent to Dr. Neysa Bonito.   Rechecked CBG 30 mins after dextrose given CBG now 117.Sally Mclaughlin

## 2019-10-25 NOTE — Progress Notes (Signed)
PROGRESS NOTE    Sally Mclaughlin    Code Status: Full Code  UXL:[id number] DOB: Dec 28, 1965 DOA: 10/24/2019 LOS: 1 days  PCP: Rory Percy, DO CC:  Chief Complaint  Patient presents with  . Chest Pain       Hospital Summary   Is a 54 year old female with history of non-insulin-dependent diabetes, hyperlipidemia, hypertension, alcohol abuse who presented to the ED with 48 hours of epigastric pain with nausea without vomiting.  Admitted to recent alcohol use on admission.  In the ED she was given GI cocktail with nearly immediate improvement in symptoms.  CT abdomen pelvis showing acute mild pancreatitis without free fluid abscess or peripancreatic fluid collection.  Lipase 1000+ and patient was admitted with pancreatitis.    A & P   Active Problems:   Pancreatitis, alcoholic, acute   1. Acute pancreatitis suspect secondary to alcohol abuse a. Also on Jardiance, Metformin, statin, lisinopril and HCTZ b. Lipid panel unremarkable  c. Did not tolerate clear liquid diet d. Continue n.p.o. and IV fluids  2. Alcohol abuse a. CIWA protocol  3. Abnormal splenic findings of unknown significance a. CT abdomen and pelvis: Multiple round ill-defined areas of decreased attenuation throughout the spleen nonspecific with differential including splenic hemangiomata, metastatic disease, lymphoma, infection versus perfusion artifact.  [place] recommended b. LUQ [place]: Confirmed true lesions within the spleen without specific diagnostic features with broad differential diagnosis.  4. 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 Holding Jardiance and Metformin b. HA1C 14 c. Hypoglycemic this a.m. requiring D50.  Patient has been n.p.o. and getting insulin d. Continue sliding scale e. Diabetic coordinator consulted  5. Hypertension a. Continue home meds  DVT prophylaxis: Heparin Family Communication: No family at bedside Disposition Plan:  Status is: Inpatient  Remains  inpatient appropriate because:Ongoing diagnostic testing needed not appropriate for outpatient work up and IV treatments appropriate due to intensity of illness or inability to take PO   Dispo: The patient is from: Home              Anticipated d/c is to: Home              Anticipated d/c date is: 2 days              Patient currently is not medically stable to d/c.          Pressure injury documentation    None   Consultants  None  Procedures  None  Antibiotics   Anti-infectives (From admission, onward)   None        Subjective   Patient with hypoglycemic episode this a.m. requiring D50 with resolution of hypoglycemia.  She has been n.p.o. and getting sliding scale insulin.  Patient requested to advance her diet this a.m.  She was given clear liquid diet however did not tolerate this as she had recurrence of epigastric pain.  Denies diarrhea or vomiting.  Does admit to been taking 40 mg ibuprofen x1 week prior to admission.  No other issues.  Objective   Vitals:   10/24/19 1854 10/24/19 2219 10/25/19 0303 10/25/19 1424  BP: 130/77 (!) 157/69 (!) 151/75 (!) 157/79  Pulse: 74 64 70 63  Resp: 16 17 16 18   Temp: (!) 97.5 F (36.4 C) 98 F (36.7 C) 98.3 F (36.8 C) (!) 97.5 F (36.4 C)  TempSrc: Oral   Oral  SpO2: 100% 100% 98% 98%  Weight:      Height:  Intake/Output Summary (Last 24 hours) at 10/25/2019 1454 Last data filed at 10/25/2019 1031 Gross per 24 hour  Intake 240 ml  Output --  Net 240 ml   Filed Weights   10/24/19 0557  Weight: 77.1 kg    Examination:  Physical Exam Vitals and nursing note reviewed.  Constitutional:      Appearance: Normal appearance.  HENT:     Head: Normocephalic and atraumatic.  Eyes:     Conjunctiva/sclera: Conjunctivae normal.  Cardiovascular:     Rate and Rhythm: Normal rate and regular rhythm.  Pulmonary:     Effort: Pulmonary effort is normal.     Breath sounds: Normal breath sounds.  Abdominal:      General: Abdomen is flat.     Palpations: Abdomen is soft.     Comments: Mild epigastric tenderness palpation  Musculoskeletal:        General: No swelling or tenderness.  Skin:    Coloration: Skin is not jaundiced or pale.  Neurological:     Mental Status: She is alert. Mental status is at baseline.  Psychiatric:        Mood and Affect: Mood normal.        Behavior: Behavior normal.     Data Reviewed: I have personally reviewed following labs and imaging studies  CBC: Recent Labs  Lab 10/24/19 0555 10/24/19 0636 10/25/19 0339  WBC 6.1  --  5.7  HGB 13.1 12.6 13.2  HCT 38.1 37.0 39.6  MCV 87.8  --  89.8  PLT 280  --  161   Basic Metabolic Panel: Recent Labs  Lab 10/24/19 0555 10/24/19 0636 10/25/19 0339  NA 134* 137 139  K 3.9 3.8 4.0  CL 100  --  102  CO2 24  --  22  GLUCOSE 315*  --  86  BUN 17  --  13  CREATININE 0.69  --  0.68  CALCIUM 9.5  --  9.5   GFR: Estimated Creatinine Clearance: 82.3 mL/min (by C-G formula based on SCr of 0.68 mg/dL). Liver Function Tests: Recent Labs  Lab 10/24/19 0555 10/25/19 0339  AST 53* 26  ALT 31 28  ALKPHOS 122 114  BILITOT 0.9 1.2  PROT 8.4* 7.8  ALBUMIN 4.2 4.0   Recent Labs  Lab 10/24/19 0555  LIPASE 1,089*   No results for input(s): AMMONIA in the last 168 hours. Coagulation Profile: No results for input(s): INR, PROTIME in the last 168 hours. Cardiac Enzymes: No results for input(s): CKTOTAL, CKMB, CKMBINDEX, TROPONINI in the last 168 hours. BNP (last 3 results) No results for input(s): PROBNP in the last 8760 hours. HbA1C: Recent Labs    10/24/19 1601  HGBA1C 14.3*   CBG: Recent Labs  Lab 10/24/19 2035 10/25/19 0747 10/25/19 0802 10/25/19 0829 10/25/19 1136  GLUCAP 92 67* 232* 117* 294*   Lipid Profile: Recent Labs    10/24/19 1601  CHOL 153  HDL 48  LDLCALC 85  TRIG 102  CHOLHDL 3.2   Thyroid Function Tests: No results for input(s): TSH, T4TOTAL, FREET4, T3FREE, THYROIDAB in  the last 72 hours. Anemia Panel: No results for input(s): VITAMINB12, FOLATE, FERRITIN, TIBC, IRON, RETICCTPCT in the last 72 hours. Sepsis Labs: No results for input(s): PROCALCITON, LATICACIDVEN in the last 168 hours.  Recent Results (from the past 240 hour(s))  SARS Coronavirus 2 by RT PCR (hospital order, performed in [facility] hospital lab) Nasopharyngeal Nasopharyngeal Swab     Status: None   Collection Time: 10/24/19  8:46 AM   Specimen: Nasopharyngeal Swab  Result Value Ref Range Status   SARS Coronavirus 2 NEGATIVE NEGATIVE Final    Comment: (NOTE) SARS-CoV-2 target nucleic acids are NOT DETECTED. The SARS-CoV-2 RNA is generally detectable in upper and lower respiratory specimens during the acute phase of infection. The lowest concentration of SARS-CoV-2 viral copies this assay can detect is 250 copies / mL. A negative result does not preclude SARS-CoV-2 infection and should not be used as the sole basis for treatment or other patient management decisions.  A negative result may occur with improper specimen collection / handling, submission of specimen other than nasopharyngeal swab, presence of viral mutation(s) within the areas targeted by this assay, and inadequate number of viral copies (<250 copies / mL). A negative result must be combined with clinical observations, patient history, and epidemiological information. Fact Sheet for Patients:   StrictlyIdeas.no Fact Sheet for Healthcare Providers: BankingDealers.co.za This test is not yet approved or cleared  by the [place] FDA and has been authorized for detection and/or diagnosis of SARS-CoV-2 by FDA under an Emergency Use Authorization (EUA).  This EUA will remain in effect (meaning this test can be used) for the duration of the COVID-19 declaration under Section 564(b)(1) of the Act, 21 U.S.C. section 360bbb-3(b)(1), unless the authorization is terminated  or revoked sooner. Performed at [facility], [address] [place], [place] [address]          Radiology Studies: DG Chest 2 View  Result Date: 10/24/2019 CLINICAL DATA:  Chest pain EXAM: CHEST - 2 VIEW COMPARISON:  None. FINDINGS: Normal heart size and mediastinal contours. No acute infiltrate or edema. No effusion or pneumothorax. Degenerative endplate spurring no acute osseous findings. IMPRESSION: No active cardiopulmonary disease. Electronically Signed   By: Monte Fantasia M.D.   On: 10/24/2019 06:36   CT ABDOMEN PELVIS W CONTRAST  Result Date: 10/24/2019 CLINICAL DATA:  Nausea and vomiting, pancreatitis EXAM: CT ABDOMEN AND PELVIS WITH CONTRAST TECHNIQUE: Multidetector CT imaging of the abdomen and pelvis was performed using the standard protocol following bolus administration of intravenous contrast. CONTRAST:  169mL OMNIPAQUE IOHEXOL 300 MG/ML  SOLN COMPARISON:  None. FINDINGS: Lower chest: No acute abnormality. Hepatobiliary: No focal liver abnormality is seen. No gallstones, gallbladder wall thickening, or biliary dilatation. Pancreas: Mildly edematous appearance of the pancreatic head and uncinate process with subtle adjacent peripancreatic fat stranding (series 2, images 34-37). No free fluid or peripancreatic fluid collection. Pancreatic body and tail are normal in appearance. No pancreatic ductal dilatation. Spleen: Multiple rounded ill-defined areas of decreased attenuation scattered throughout the spleen. Spleen is normal in size. Adrenals/Urinary Tract: Adrenal glands are unremarkable. Kidneys are normal, without renal calculi, focal lesion, or hydronephrosis. Bladder is unremarkable. Stomach/Bowel: Stomach is within normal limits. Appendix appears normal (series 2, image 58). No evidence of bowel wall thickening, distention, or inflammatory changes. Vascular/Lymphatic: No significant vascular findings are present. No enlarged abdominal or pelvic lymph nodes.  Reproductive: Multi fibroid uterus.  No adnexal mass. Other: No free fluid or abdominopelvic fluid collection. No pneumoperitoneum. Prior ventral abdominal wall repair. No abdominal wall hernia. Musculoskeletal: Degenerative changes of the bilateral SI joints, right slightly greater than left. No acute osseous findings. IMPRESSION: 1. Findings suggestive of mild acute pancreatitis involving the pancreatic head and uncinate process. No free fluid or peripancreatic fluid collection. 2. Multiple rounded ill-defined areas of decreased attenuation scattered throughout the spleen. Findings are nonspecific with differential including splenic hemangiomata, metastatic disease, lymphoma, versus  infection. Perfusion artifact mimicking multiple splenic masses is also possibility. Initial evaluation with left upper quadrant ultrasound is recommended. 3. Multi fibroid uterus. Electronically Signed   By: Davina Poke D.O.   On: 10/24/2019 08:18   US Abdomen Limited  Result Date: 10/25/2019 CLINICAL DATA:  Abnormal spleen by CT. EXAM: ULTRASOUND ABDOMEN LIMITED COMPARISON:  Abdominal CT from earlier today FINDINGS: Ultrasound confirms hypoechoic rounded lesions throughout the spleen. Splenic size is within normal limits. The lesions do not have a specific ultrasound appearance and findings such as this are commonly related to granulomatous disease, lymphoproliferative disease, or metastatic disease (if there is known malignancy). IMPRESSION: Confirmed true lesions within the spleen without specific diagnostic feature, please see above. Electronically Signed   By: Monte Fantasia M.D.   On: 10/25/2019 11:03        Scheduled Meds: . folic acid  1 mg Oral Daily  . heparin  5,000 Units Subcutaneous Q8H  . insulin aspart  0-15 Units Subcutaneous TID WC  . insulin aspart  0-5 Units Subcutaneous QHS  . multivitamin with minerals  1 tablet Oral Daily  . thiamine  100 mg Oral Daily   Continuous Infusions:   Time  spent: 28 minutes with over 50% of the time coordinating the patient's care    Harold Hedge, DO [facility] Pager [phone]  Call night coverage person covering after 7pm

## 2019-10-26 LAB — CBC
HCT: 36.8 % (ref 36.0–46.0)
Hemoglobin: 12.4 g/dL (ref 12.0–15.0)
MCH: 30.2 pg (ref 26.0–34.0)
MCHC: 33.7 g/dL (ref 30.0–36.0)
MCV: 89.5 fL (ref 80.0–100.0)
Platelets: 225 10*3/uL (ref 150–400)
RBC: 4.11 MIL/uL (ref 3.87–5.11)
RDW: 12.4 % (ref 11.5–15.5)
WBC: 4.5 10*3/uL (ref 4.0–10.5)
nRBC: 0 % (ref 0.0–0.2)

## 2019-10-26 LAB — BASIC METABOLIC PANEL
Anion gap: 11 (ref 5–15)
BUN: 13 mg/dL (ref 6–20)
CO2: 23 mmol/L (ref 22–32)
Calcium: 9.3 mg/dL (ref 8.9–10.3)
Chloride: 105 mmol/L (ref 98–111)
Creatinine, Ser: 0.76 mg/dL (ref 0.44–1.00)
GFR calc Af Amer: >60 mL/min (ref >60)
GFR calc non Af Amer: >60 mL/min (ref >60)
Glucose, Bld: 102 mg/dL — ABNORMAL HIGH (ref 70–99)
Potassium: 3.8 mmol/L (ref 3.5–5.1)
Sodium: 139 mmol/L (ref 135–145)

## 2019-10-26 LAB — GLUCOSE, CAPILLARY
Glucose-Capillary: 113 mg/dL — ABNORMAL HIGH (ref 70–99)
Glucose-Capillary: 313 mg/dL — ABNORMAL HIGH (ref 70–99)
Glucose-Capillary: 285 mg/dL — ABNORMAL HIGH (ref 70–99)

## 2019-10-26 LAB — LIPASE, BLOOD: Lipase: 44 U/L (ref 11–51)

## 2019-10-26 MED ORDER — INSULIN PEN NEEDLE 31G X 5 MM MISC: 1.0000 [IU] | Freq: Every day | 0 refills | Status: DC

## 2019-10-26 MED ORDER — INSULIN GLARGINE 100 UNIT/ML SOLOSTAR PEN
12.0000 [IU] | PEN_INJECTOR | Freq: Every day | SUBCUTANEOUS | 1 refills | Status: DC
Start: 2019-10-26 — End: 2019-11-15

## 2019-10-26 NOTE — Discharge Summary (Addendum)
Physician Discharge Summary  Sally Mclaughlin GNF:[id number] DOB: [birth year]   PCP: Rory Percy, DO  Admit date: 10/24/2019 Discharge date: 10/26/2019 Length of Stay: 2 days   Code Status: Full Code  Admitted From:  Home Discharged to:   [place]:  None  Equipment/Devices:  None Discharge Condition:  Stable  Recommendations for Outpatient Follow-up   1. Follow up with PCP in 1 week 2. Follow up BMP/CBC 3. Jardiance held, started on insulin at discharge  Hospital Summary  This is a 54 year old female with history of non-insulin-dependent diabetes, hyperlipidemia, hypertension, alcohol abuse who presented to the ED with 48 hours of epigastric pain with nausea without vomiting.  Admitted to recent alcohol use on admission.  In the ED she was given GI cocktail with nearly immediate improvement in symptoms.  CT abdomen pelvis showing acute mild pancreatitis without free fluid abscess or peripancreatic fluid collection.  Lipase 1000+ and patient was admitted with pancreatitis.  Thought to be alcohol induced pancreatitis.  Patient had resolution of her symptoms and was able to tolerate a soft diet prior to discharge.  Patient was seen by social work and given resources for her alcohol use.  She was also seen by the diabetic coordinator due to her significantly elevated A1c.  Sugars were well controlled throughout her stay while on insulin.  Discharged with Lantus Solostar 12 units daily to be taken at 3 PM each day prior to leaving for work  A & P   Active Problems:   Pancreatitis, alcoholic, acute    1. Acute alcohol induced pancreatitis  a. Also on Jardiance, Metformin, statin, lisinopril and HCTZ though I doubt these are contributing b. Lipid panel unremarkable  c. Tolerating soft diet prior to discharge d. Advised to abstain from alcohol  2. Alcohol abuse a. Seen by social work  3. Abnormal splenic findings of unknown significance a. CT abdomen and pelvis: Multiple  round ill-defined areas of decreased attenuation throughout the spleen nonspecific with differential including splenic hemangiomata, metastatic disease, lymphoma, infection versus perfusion artifact.  [place] recommended b. LUQ [place]: Confirmed true lesions within the spleen without specific diagnostic features with broad differential diagnosis. c. Consider further outpatient work-up  4. 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 HA1C 14 b. Hypoglycemic due to n.p.o. and required D50 x1 resolved c. Diabetic coordinator consulted.  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 Continue Metformin e. Vania Rea held at discharge f. Close PCP follow-up  5. Hypertension a. Continue home meds    Consultants  . None  Procedures  . None  Antibiotics   Anti-infectives (From admission, onward)   None       Subjective  Patient seen and examined at bedside no acute distress and resting comfortably.  No events overnight.  Tolerating diet. In good spirits and anticipating discharge.   Denies any chest pain, shortness of breath, fever, nausea, vomiting, urinary or bowel complaints. Otherwise ROS negative    Objective   Discharge Exam: Vitals:   10/26/19 1142 10/26/19 1753  BP: (!) 162/81 (!) 144/79  Pulse: 72 73  Resp: 16 18  Temp: 97.9 F (36.6 C) 97.7 F (36.5 C)  SpO2: 98% 96%   Vitals:   10/26/19 0403 10/26/19 0800 10/26/19 1142 10/26/19 1753  BP: 138/80  (!) 162/81 (!) 144/79  Pulse: 65 70 72 73  Resp: '18  16 18  '$ Temp: 97.8 F (36.6 C)  97.9  F (36.6 C) 97.7 F (36.5 C)  TempSrc:   Oral Oral  SpO2: 98%  98% 96%  Weight:      Height:        Physical Exam Vitals and nursing note reviewed.  Constitutional:      Appearance: Normal appearance.  HENT:     Head: Normocephalic and atraumatic.  Eyes:     Conjunctiva/sclera: Conjunctivae normal.  Cardiovascular:     Rate and Rhythm:  Normal rate and regular rhythm.  Pulmonary:     Effort: Pulmonary effort is normal.     Breath sounds: Normal breath sounds.  Abdominal:     General: Abdomen is flat.     Palpations: Abdomen is soft.  Musculoskeletal:        General: No swelling or tenderness.  Skin:    Coloration: Skin is not jaundiced or pale.  Neurological:     Mental Status: She is alert. Mental status is at baseline.  Psychiatric:        Mood and Affect: Mood normal.        Behavior: Behavior normal.       The results of significant diagnostics from this hospitalization (including imaging, microbiology, ancillary and laboratory) are listed below for reference.     Microbiology: Recent Results (from the past 240 hour(s))  SARS Coronavirus 2 by RT PCR (hospital order, performed in [facility] hospital lab) Nasopharyngeal Nasopharyngeal Swab     Status: None   Collection Time: 10/24/19  8:46 AM   Specimen: Nasopharyngeal Swab  Result Value Ref Range Status   SARS Coronavirus 2 NEGATIVE NEGATIVE Final    Comment: (NOTE) SARS-CoV-2 target nucleic acids are NOT DETECTED. The SARS-CoV-2 RNA is generally detectable in upper and lower respiratory specimens during the acute phase of infection. The lowest concentration of SARS-CoV-2 viral copies this assay can detect is 250 copies / mL. A negative result does not preclude SARS-CoV-2 infection and should not be used as the sole basis for treatment or other patient management decisions.  A negative result may occur with improper specimen collection / handling, submission of specimen other than nasopharyngeal swab, presence of viral mutation(s) within the areas targeted by this assay, and inadequate number of viral copies (<250 copies / mL). A negative result must be combined with clinical observations, patient history, and epidemiological information. Fact Sheet for Patients:   StrictlyIdeas.no Fact Sheet for Healthcare  Providers: BankingDealers.co.za This test is not yet approved or cleared  by the [place] FDA and has been authorized for detection and/or diagnosis of SARS-CoV-2 by FDA under an Emergency Use Authorization (EUA).  This EUA will remain in effect (meaning this test can be used) for the duration of the COVID-19 declaration under Section 564(b)(1) of the Act, 21 U.S.C. section 360bbb-3(b)(1), unless the authorization is terminated or revoked sooner. Performed at [facility], [address] [place], [place] [address]      Labs: BNP (last 3 results) No results for input(s): BNP in the last 8760 hours. Basic Metabolic Panel: Recent Labs  Lab 10/24/19 0555 10/24/19 0636 10/25/19 0339 10/25/19 1513 10/26/19 0357  NA 134* 137 139 138 139  K 3.9 3.8 4.0 3.9 3.8  CL 100  --  102 104 105  CO2 24  --  '22 26 23  '$ GLUCOSE 315*  --  86 131* 102*  BUN 17  --  '13 14 13  '$ CREATININE 0.69  --  0.68 0.76 0.76  CALCIUM 9.5  --  9.5 9.3 9.3  MG  --   --   --  2.0  --   PHOS  --   --   --  2.9  --    Liver Function Tests: Recent Labs  Lab 10/24/19 0555 10/25/19 0339 10/25/19 1513  AST 53* 26 24  ALT '31 28 27  '$ ALKPHOS 122 114 102  BILITOT 0.9 1.2 1.1  PROT 8.4* 7.8 7.1  ALBUMIN 4.2 4.0 3.6   Recent Labs  Lab 10/24/19 0555 10/26/19 0357  LIPASE 1,089* 44   No results for input(s): AMMONIA in the last 168 hours. CBC: Recent Labs  Lab 10/24/19 0555 10/24/19 0636 10/25/19 0339 10/25/19 1513 10/26/19 0357  WBC 6.1  --  5.7 4.9 4.5  HGB 13.1 12.6 13.2 12.6 12.4  HCT 38.1 37.0 39.6 36.7 36.8  MCV 87.8  --  89.8 89.3 89.5  PLT 280  --  256 236 225   Cardiac Enzymes: No results for input(s): CKTOTAL, CKMB, CKMBINDEX, TROPONINI in the last 168 hours. BNP: Invalid input(s): POCBNP CBG: Recent Labs  Lab 10/25/19 1723 10/25/19 2117 10/26/19 0749 10/26/19 1131 10/26/19 1657  GLUCAP 89 74 113* 313* 285*   D-Dimer No results for  input(s): DDIMER in the last 72 hours. Hgb A1c Recent Labs    10/24/19 1601  HGBA1C 14.3*   Lipid Profile Recent Labs    10/24/19 1601  CHOL 153  HDL 48  LDLCALC 85  TRIG 102  CHOLHDL 3.2   Thyroid function studies No results for input(s): TSH, T4TOTAL, T3FREE, THYROIDAB in the last 72 hours.  Invalid input(s): FREET3 Anemia work up No results for input(s): VITAMINB12, FOLATE, FERRITIN, TIBC, IRON, RETICCTPCT in the last 72 hours. Urinalysis    Component Value Date/Time   BILIRUBINUR NEGATIVE 03/28/2013 0906   PROTEINUR 100 03/28/2013 0906   UROBILINOGEN 0.2 03/28/2013 0906   NITRITE NEGATIVE 03/28/2013 0906   LEUKOCYTESUR Negative 03/28/2013 0906   Sepsis Labs Invalid input(s): PROCALCITONIN,  WBC,  LACTICIDVEN Microbiology Recent Results (from the past 240 hour(s))  SARS Coronavirus 2 by RT PCR (hospital order, performed in [place] hospital lab) Nasopharyngeal Nasopharyngeal Swab     Status: None   Collection Time: 10/24/19  8:46 AM   Specimen: Nasopharyngeal Swab  Result Value Ref Range Status   SARS Coronavirus 2 NEGATIVE NEGATIVE Final    Comment: (NOTE) SARS-CoV-2 target nucleic acids are NOT DETECTED. The SARS-CoV-2 RNA is generally detectable in upper and lower respiratory specimens during the acute phase of infection. The lowest concentration of SARS-CoV-2 viral copies this assay can detect is 250 copies / mL. A negative result does not preclude SARS-CoV-2 infection and should not be used as the sole basis for treatment or other patient management decisions.  A negative result may occur with improper specimen collection / handling, submission of specimen other than nasopharyngeal swab, presence of viral mutation(s) within the areas targeted by this assay, and inadequate number of viral copies (<250 copies / mL). A negative result must be combined with clinical observations, patient history, and epidemiological information. Fact Sheet for Patients:    StrictlyIdeas.no Fact Sheet for Healthcare Providers: BankingDealers.co.za This test is not yet approved or cleared  by the [place] FDA and has been authorized for detection and/or diagnosis of SARS-CoV-2 by FDA under an Emergency Use Authorization (EUA).  This EUA will remain in effect (meaning this test can be used) for the duration of the COVID-19 declaration under Section 564(b)(1) of the Act, 21 U.S.C.  section 360bbb-3(b)(1), unless the authorization is terminated or revoked sooner. Performed at [facility], [address] [place], [place] [address]     Discharge Instructions     Discharge Instructions    DIET SOFT   Complete by: As directed    Diet - low sodium heart healthy   Complete by: As directed    Discharge instructions   Complete by: As directed    -Follow-up with your primary care physician within 1 week with lab work prior to appointment -Continue with a soft diet for now until you follow-up with your PCP -Do not drink any alcohol going forward as this can cause recurrence of your pancreatitis. You have any significant change or worsening of your symptoms please not hesitate contact your primary care physician or return to the ED   Increase activity slowly   Complete by: As directed      Allergies as of 10/26/2019   No Known Allergies     Medication List    STOP taking these medications   empagliflozin 25 MG Tabs tablet Commonly known as: JARDIANCE     TAKE these medications   aspirin 81 MG tablet Take 1 tablet (81 mg total) by mouth daily.   atorvastatin 80 MG tablet Commonly known as: LIPITOR Take 1 tablet (80 mg total) by mouth daily.   calcium carbonate 500 MG chewable tablet Commonly known as: TUMS - dosed in mg elemental calcium Chew 1 tablet (200 mg of elemental calcium total) by mouth 3 (three) times daily as needed for indigestion or heartburn.   freestyle lancets Use  as instructed   FreeStyle Lite Devi 1 kit by Does not apply route daily.   FREESTYLE LITE test strip Generic drug: glucose blood Use as instructed   ibuprofen 200 MG tablet Commonly known as: ADVIL Take 200 mg by mouth every 6 (six) hours as needed for moderate pain.   insulin glargine 100 UNIT/ML Solostar Pen Commonly known as: LANTUS Inject 12 Units into the skin daily.   Insulin Pen Needle 31G X 5 MM Misc 1 Units by Does not apply route daily.   lisinopril-hydrochlorothiazide 20-12.5 MG tablet Commonly known as: ZESTORETIC Take 1 tablet by mouth daily.   metFORMIN 500 MG tablet Commonly known as: GLUCOPHAGE Take 2 tablets (1,000 mg total) by mouth 2 (two) times daily with a meal.       No Known Allergies   Dispo: The patient is from: Home              Anticipated d/c is to: Home              Anticipated d/c date is today              Patient currently is medically stable to d/c.       Time coordinating discharge: Over 30 minutes   SIGNED:   Harold Hedge, D.O. [facility] Pager: [phone]  10/26/2019, 6:17 PM

## 2019-10-26 NOTE — Plan of Care (Signed)
  Problem: Education: Goal: Knowledge of Pancreatitis treatment and prevention will improve 10/26/2019 1720 by Beckey Downing, RN Outcome: Adequate for Discharge 10/26/2019 0957 by Beckey Downing, RN Outcome: Progressing   Problem: Health Behavior/Discharge Planning: Goal: Ability to formulate a plan to maintain an alcohol-free life will improve 10/26/2019 1720 by Beckey Downing, RN Outcome: Adequate for Discharge 10/26/2019 0957 by Beckey Downing, RN Outcome: Progressing   Problem: Nutritional: Goal: Ability to achieve adequate nutritional intake will improve 10/26/2019 1720 by Beckey Downing, RN Outcome: Adequate for Discharge 10/26/2019 0957 by Beckey Downing, RN Outcome: Progressing   Problem: Clinical Measurements: Goal: Complications related to the disease process, condition or treatment will be avoided or minimized 10/26/2019 1720 by Beckey Downing, RN Outcome: Adequate for Discharge 10/26/2019 0957 by Beckey Downing, RN Outcome: Progressing

## 2019-10-26 NOTE — Progress Notes (Signed)
Inpatient Diabetes Program Recommendations  AACE/ADA: New Consensus Statement on Inpatient Glycemic Control (2015)  Target Ranges:  Prepandial:   less than 140 mg/dL      Peak postprandial:   less than 180 mg/dL (1-2 hours)      Critically ill patients:  140 - 180 mg/dL   Lab Results  Component Value Date   GLUCAP 285 (H) 10/26/2019   HGBA1C 14.3 (H) 10/24/2019    Review of Glycemic Control  Educated patient on insulin pen use at home. Reviewed contents of insulin flexpen starter kit. Reviewed all steps if insulin pen including attachment of needle, 2-unit air shot, dialing up dose, giving injection, removing needle, disposal of sharps, storage of unused insulin, disposal of insulin etc. Patient able to provide successful return demonstration. Also reviewed troubleshooting with insulin pen. MD to give patient Rxs for insulin pens and insulin pen needles.  To f/u with PCP within a week.  Inpatient Diabetes Program Recommendations:     Lantus Solostar 12 units QD (will be taking at 3 pm each day prior to leaving for work)  Insulin pen needles - [phone]  Discussed hypoglycemia s/s and treatment. Pt voices understanding.  Thank you. Lorenda Peck, RD, LDN, CDE Inpatient Diabetes Coordinator [phone]

## 2019-10-26 NOTE — TOC Initial Note (Signed)
Transition of Care [facility]) - Initial/Assessment Note    Patient Details  Name: Sally Mclaughlin MRN: [id number] Date of Birth: [birth year]  Transition of Care [facility]) CM/SW Contact:    Sally Mage, LCSW Phone Number: 10/26/2019, 2:37 PM  Clinical Narrative:    Patient seen in follow up to MD consult for alcohol use.  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  She lives alone, has parents and children who live in [place], as well as 7 grandchildren, ranging in age form 43 to 81.  She does not cite family as a main support; rather, she describes relationship with them as "hectic."  She works 6PM-2:30AM schedule M-F, and while she admits this is not an ideal schedule, she makes it work because she does not want to work weekends.    During the course of our interview, Sally Mclaughlin moved from pre-contemplation to contemplation phase.  She initially stated drinking was not necessarily related to her hospitalization, stating "it could be the food I eat, or my stress level."  By the end, she was admitting that liquor, beer or wine were likely directly related to her pancreatitis, and was talking about cutting back her use "because I want to live a long time and have a good quality of life. And I don't want to be hospitalized again."   TOC will continue to follow during the course of hospitalization.             Expected Discharge Plan: Home/Self Care Barriers to Discharge: No Barriers Identified   Patient Goals and CMS Choice Patient states their goals for this hospitalization and ongoing recovery are:: "maybe I should cut back"      Expected Discharge Plan and Services Expected Discharge Plan: Home/Self Care In-house Referral: Clinical Social Work     Living arrangements for the past 2 months: Single Family Home                                      Prior Living Arrangements/Services Living arrangements for the  past 2 months: Single Family Home Lives with:: Self Patient language and need for interpreter reviewed:: Yes Do you feel safe going back to the place where you live?: Yes      Need for Family Participation in Patient Care: No (Comment) Care giver support system in place?: No (comment) ("It's all too hectic.")   Criminal Activity/Legal Involvement Pertinent to Current Situation/Hospitalization: No - Comment as needed  Activities of Daily Living Home Assistive Devices/Equipment: Eyeglasses ADL Screening (condition at time of admission) Patient's cognitive ability adequate to safely complete daily activities?: Yes Is the patient deaf or have difficulty hearing?: No Does the patient have difficulty seeing, even when wearing glasses/contacts?: No Does the patient have difficulty concentrating, remembering, or making decisions?: No Patient able to express need for assistance with ADLs?: Yes Does the patient have difficulty dressing or bathing?: No Independently performs ADLs?: Yes (appropriate for developmental age) Does the patient have difficulty walking or climbing stairs?: No Weakness of Legs: None Weakness of Arms/Hands: None  Permission Sought/Granted                  Emotional Assessment Appearance:: Appears stated age Attitude/Demeanor/Rapport: Engaged Affect (typically observed): Appropriate Orientation: : Oriented to Self, Oriented to Place, Oriented to  Time, Oriented to Situation Alcohol /  Substance Use: Alcohol Use Psych Involvement: No (comment)  Admission diagnosis:  Pancreatitis, alcoholic, acute [G95.62] Chest pain [R07.9] Acute pancreatitis without infection or necrosis, unspecified pancreatitis type [K85.90] Patient Active Problem List   Diagnosis Date Noted  . Pancreatitis, alcoholic, acute 13/12/6576  . Cortical age-related cataract of both eyes 09/26/2019  . Dermatochalasis of both upper eyelids 09/26/2019  . Suprapubic abscess 09/06/2019  .  Hidradenitis axillaris 08/18/2018  . Pain in joint, ankle and foot 08/18/2018  . Epigastric pain 06/15/2018  . Carpal tunnel syndrome 11/12/2015  . TOBACCO USER 02/23/2009  . Hyperlipidemia 07/23/2008  . Diabetes mellitus type 2, uncontrolled ([place]) 05/14/2008  . Overweight 07/15/2006  . Alcohol abuse, unspecified 07/15/2006  . Essential hypertension 07/15/2006   PCP:  Sally Percy, DO Pharmacy:   [facility] [place]. [address] [place] [address] Phone: [phone] Fax: [phone]     Social Determinants of Health (SDOH) Interventions    Readmission Risk Interventions No flowsheet data found.

## 2019-10-26 NOTE — Plan of Care (Signed)
  Problem: Education: Goal: Knowledge of Pancreatitis treatment and prevention will improve Outcome: Progressing   Problem: Health Behavior/Discharge Planning: Goal: Ability to formulate a plan to maintain an alcohol-free life will improve Outcome: Progressing   Problem: Nutritional: Goal: Ability to achieve adequate nutritional intake will improve Outcome: Progressing   Problem: Clinical Measurements: Goal: Complications related to the disease process, condition or treatment will be avoided or minimized Outcome: Progressing   

## 2019-10-27 ENCOUNTER — Other Ambulatory Visit: Payer: Self-pay | Admitting: *Deleted

## 2019-10-27 ENCOUNTER — Encounter: Payer: Self-pay | Admitting: *Deleted

## 2019-10-27 NOTE — Patient Outreach (Signed)
[facility]) Care Management  10/27/2019  HARPER VANDERVOORT 1965-11-18 [id number]   Transition of care call/case closure   Referral received:10/25/19 Initial outreach:10/27/19 Insurance: [employer]    Subjective: Initial successful telephone call to patient's preferred number in order to complete transition of care assessment; 2 HIPAA identifiers verified. Explained purpose of call and completed transition of care assessment.  Kimaria states that she doing pretty good. She denies abdominal discomfort nausea or vomiting. She reports eating a soft diet since discharge and doing okay. She denies having problems with diarrhea.  Patient discussed wanting to take care  of herself and get the diabetes under control . She discussed just picking up insulin prescription on today, she is able to restate recommended time frame of daily use at 3 pm to Lantus insulin due to her work schedule. She recalls being shown how to use insulin pen and state that it will be easy. She report having a blood sugar meter at home, she hasn't used it since being home to check reading.   Reviewed symptoms of low blood sugar reading of 70, and action plan of rule of 15 . Discussed benefit of Diabetes nutrition education classes, patient began to state she doesn't have time for that due to her work schedule .  Discussed recommendations to abstain of alcohol, patient identified that she is not an alcoholic but she will cut back, discussed how long term alcohol by cause irritation to pancreas. She again states she just drinks on the weekend and will cut back.   Discussed accessing [place] benefits. :  Offered contact information for Employee assistance program,after explanation of services, she declined need.     Discussed  ongoing health issue of Diabetes with A1c of 14.3 patient voiced understanding of Diabetes being uncontrolled   and says she is referral to one of the [place] chronic   She is  unsure if she has  the hospital indemnity plan, provided [employer] contact number at [phone] to file a claim if needed  She  uses a [facility] outpatient pharmacy [facility]     Objective:  Luree Palla  was hospitalized at [facility] from 6/8-6/10/21 for Acute alcoholic induced pancreatitis  Comorbidities include: Diabetes type 2 uncontrolled , Hypertension,Alcohol abuse She was discharged to home on 10/26/19 without the need for home health services or DME.   Assessment:  Patient voices good understanding of all discharge instructions.  See transition of care flowsheet for assessment details.   Plan:  Reviewed hospital discharge diagnosis of Acute alcohol induced pancreatitis   and discharge treatment plan using hospital discharge instructions, assessing medication adherence, reviewing problems requiring provider notification, and discussing the importance of follow up with primary care provider  as directed. Reinforced taking insulin as prescribed, encouraged monitoring blood sugars and keeping all medical appointments and taking blood meter to appointment.   Reviewed [place] healthy lifestyle program information to receive discounted premium for  2022   Step 1: Get  your annual physical  Step 2: Complete your health assessment  Step 3:Identify your current health status and complete the corresponding action step between January 1, and January 17, 2020.    Using [place] website, enrolled patient  participate in 's Active Health Management chronic disease management program.    Patient declined additional follow up ,so will close case to [place] Management services and route successful outreach letter with [place] Management  pamphlet and 24 Hour Nurse Line Magnet to [place] Management clinical pool to be mailed to patient's home address.  Thanked patient for their  services to [facility].   Joylene Draft, RN, BSN  [place] Management Coordinator  [phone]- Mobile [phone]- Toll Free Main Office

## 2019-11-01 ENCOUNTER — Ambulatory Visit: Payer: 59 | Admitting: Family Medicine

## 2019-11-01 ENCOUNTER — Other Ambulatory Visit: Payer: Self-pay

## 2019-11-01 ENCOUNTER — Encounter: Payer: Self-pay | Admitting: Family Medicine

## 2019-11-01 VITALS — BP 130/80 | HR 85 | Ht 66.0 in | Wt 163.2 lb

## 2019-11-01 DIAGNOSIS — F1010 Alcohol abuse, uncomplicated: Secondary | ICD-10-CM | POA: Diagnosis not present

## 2019-11-01 DIAGNOSIS — D7389 Other diseases of spleen: Secondary | ICD-10-CM | POA: Diagnosis not present

## 2019-11-01 DIAGNOSIS — K852 Alcohol induced acute pancreatitis without necrosis or infection: Secondary | ICD-10-CM | POA: Diagnosis not present

## 2019-11-01 DIAGNOSIS — E1165 Type 2 diabetes mellitus with hyperglycemia: Secondary | ICD-10-CM | POA: Diagnosis not present

## 2019-11-01 NOTE — Progress Notes (Signed)
    SUBJECTIVE:   CHIEF COMPLAINT / HPI:   Hospital follow-up - Admitted 6/8-6/10 for pancreatitis thought to be 2/2 recent alcohol use.  Given alcohol cessation resources by social work.  Also seen by diabetic coordinator during admission.  A1c 14. - Incidentally found multiple ill-defined areas of decreased attenuation throughout the spleen with broad differential. LUQ [place] confirmed true lesions without specific diagnostic features. Denies any discomfort. - Med changes: Held home Jardiance, started Lantus 12 units daily - feels better. Able to eat ok. - started lantus, taking once daily but hasn't taken at same time each day. - works second shift. - hasn't checked CBGs. - normally drinks 6 pack beer, 1-2 shots of liquor on weekends. None since discharge. No wine or liquor. Drinking for years. No eye openers. Never made miss work or affected relationships.  - no h/o cancer, no pain.  PERTINENT  PMH / PSH: HTN, H/o alcoholic pancreatitis, S9GG, hidradenitis axillaris, HLD, overweight, alcohol use disorder, tobacco use  OBJECTIVE:   BP 130/80   Pulse 85   Ht 5\' 6"  (1.676 m)   Wt 163 lb 3.2 oz (74 kg)   LMP 11/25/2012   SpO2 99%   BMI 26.34 kg/m   Gen: overweight, in NAD Card: RRR Lungs: CTAB, normal WOB on RA Abd: soft, NTND, no appreciable organomegaly, +BS  ASSESSMENT/PLAN:   Diabetes mellitus type 2, uncontrolled ([place]) Suspect also contributed to recent pancreatitis. MUCH counseling done today regarding once daily lantus dosing at the same time each day and checking CBGs at least daily with demonstration. Diet counseling also provided today. Patient declined diabetic education referral. F/u in 2 weeks.   Alcohol abuse Counseling provided regarding cutting down.   Pancreatitis, alcoholic, acute Resolved.  Splenic lesion Multiple ill-defined areas seen on CT A/P and LUQ [place]. Currently asymptomatic. CBC during hospitalization wnl. Suspect 2/2 infarction given acute  pancreatitis, now currently asymptomatic, no prior h/o cancer and UTD on age-appropriate cancer screenings. No h/o sickle cell disease. No findings or symptoms to suggest abscess. Exam wnl. Will obtain repeat CBC, BMP today. Consider repeat LUQ [place] in 6 weeks to reassess.    Rory Percy, [place]

## 2019-11-01 NOTE — Patient Instructions (Signed)
It was great to see you!  Our plans for today:  - Pick a time that will work for every day to take your Lantus. - check your blood sugars once a day every day and keep a log of this.  - Come back in 2 weeks with your blood sugar log.   Take care and seek immediate care sooner if you develop any concerns.   Dr. Johnsie [facility]

## 2019-11-02 ENCOUNTER — Other Ambulatory Visit: Payer: Self-pay | Admitting: *Deleted

## 2019-11-02 LAB — BASIC METABOLIC PANEL
BUN/Creatinine Ratio: 23 (ref 9–23)
BUN: 23 mg/dL (ref 6–24)
CO2: 22 mmol/L (ref 20–29)
Calcium: 10.2 mg/dL (ref 8.7–10.2)
Chloride: 101 mmol/L (ref 96–106)
Creatinine, Ser: 1.02 mg/dL — ABNORMAL HIGH (ref 0.57–1.00)
GFR calc Af Amer: 71 mL/min/{1.73_m2} (ref >59)
GFR calc non Af Amer: 62 mL/min/{1.73_m2} (ref >59)
Glucose: 270 mg/dL — ABNORMAL HIGH (ref 65–99)
Potassium: 4.6 mmol/L (ref 3.5–5.2)
Sodium: 138 mmol/L (ref 134–144)

## 2019-11-02 LAB — CBC WITH DIFFERENTIAL
Basophils Absolute: 0 10*3/uL (ref 0.0–0.2)
Basos: 1 %
EOS (ABSOLUTE): 0.1 10*3/uL (ref 0.0–0.4)
Eos: 1 %
Hematocrit: 40.5 % (ref 34.0–46.6)
Hemoglobin: 13.7 g/dL (ref 11.1–15.9)
Immature Grans (Abs): 0 10*3/uL (ref 0.0–0.1)
Immature Granulocytes: 0 %
Lymphocytes Absolute: 2.1 10*3/uL (ref 0.7–3.1)
Lymphs: 33 %
MCH: 30 pg (ref 26.6–33.0)
MCHC: 33.8 g/dL (ref 31.5–35.7)
MCV: 89 fL (ref 79–97)
Monocytes Absolute: 0.3 10*3/uL (ref 0.1–0.9)
Monocytes: 4 %
Neutrophils Absolute: 4 10*3/uL (ref 1.4–7.0)
Neutrophils: 61 %
RBC: 4.56 x10E6/uL (ref 3.77–5.28)
RDW: 12.7 % (ref 11.7–15.4)
WBC: 6.5 10*3/uL (ref 3.4–10.8)

## 2019-11-02 NOTE — Patient Outreach (Signed)
[facility]) Care Management  11/02/2019  Sally Mclaughlin 1965/08/31 [id number]   General discharge Red Flag Alert   Day #4 Date: 11/01/19 Reason : Who reached Patient  Lost interest in things? Yes  Sad/hopeless/anxious/empty? Yes    Outreach attempt #1 Subjective: Successful outreach cal to patient she reports being a little down after discharge but she is good now getting used new changes with diabetes. Discussed with patient Employee assistance counseling program benefit, she denied need .  She discussed recent visit to PCP and plans for nutrition class. She discussed getting adjusted to giving herself insulin and has plan to give each evening at 5 pm prior to going to work. She discussed that she is working on sticking her finger to check blood sugars as recommended, she discussed having meter and supplies.   Plan Will close case to [facility] care management.  Reinforced attending all medical appointments Reminded of being in enrolled in Active health management chronic disease management program telephonic program.    Joylene Draft, RN, BSN  [place] Management Coordinator  [phone]- Mobile [phone]- Toll Free Main Office

## 2019-11-03 DIAGNOSIS — D7389 Other diseases of spleen: Secondary | ICD-10-CM | POA: Insufficient documentation

## 2019-11-03 NOTE — Assessment & Plan Note (Addendum)
Multiple ill-defined areas seen on CT A/P and LUQ [place]. Currently asymptomatic. CBC during hospitalization wnl. Suspect 2/2 infarction given acute pancreatitis, now currently asymptomatic, no prior h/o cancer and UTD on age-appropriate cancer screenings. No h/o sickle cell disease. No findings or symptoms to suggest abscess. Exam wnl. Will obtain repeat CBC, BMP today. Consider repeat LUQ [place] in 6 weeks to reassess.

## 2019-11-03 NOTE — Assessment & Plan Note (Signed)
Counseling provided regarding cutting down.

## 2019-11-03 NOTE — Assessment & Plan Note (Signed)
Resolved

## 2019-11-03 NOTE — Assessment & Plan Note (Signed)
Suspect also contributed to recent pancreatitis. MUCH counseling done today regarding once daily lantus dosing at the same time each day and checking CBGs at least daily with demonstration. Diet counseling also provided today. Patient declined diabetic education referral. F/u in 2 weeks.

## 2019-11-15 ENCOUNTER — Other Ambulatory Visit: Payer: Self-pay | Admitting: Family Medicine

## 2019-11-15 ENCOUNTER — Ambulatory Visit: Payer: 59 | Admitting: Family Medicine

## 2019-11-15 DIAGNOSIS — R1900 Intra-abdominal and pelvic swelling, mass and lump, unspecified site: Secondary | ICD-10-CM

## 2019-11-15 MED ORDER — BASAGLAR KWIKPEN 100 UNIT/ML ~~LOC~~ SOPN: 12.0000 [IU] | PEN_INJECTOR | Freq: Every day | SUBCUTANEOUS | 1 refills | Status: DC

## 2019-11-15 NOTE — Progress Notes (Signed)
Spoke with patient regarding missed appointment today.  Patient has been without insulin since Friday. Hasn't yet started checking her blood sugars. Advised patient I sent insulin Rx to pharmacy. Already has supplies for checking CBGs.   Ordered abd MRI to f/u splenic lesions not well defined on CT A/P or LUQ [place].   Has f/u on 7/6, advised keeping this appt.   Rory Percy, DO PGY-3, [facility] 11/15/2019 2:46 PM

## 2019-11-15 NOTE — Progress Notes (Signed)
Attempted to make appt for MRI. Had to change the order. Will let PCP know. Salvatore Marvel, CMA

## 2019-11-16 ENCOUNTER — Telehealth: Payer: Self-pay

## 2019-11-16 MED FILL — BASAGLAR 100 UNIT/ML KWIKPE: 100 | 25 days supply | Qty: 3 | Fill #1

## 2019-11-16 NOTE — Telephone Encounter (Signed)
Had to fax MRI order over to [facility] due to their phone lines being down. The fax number is [phone].  Salvatore Marvel, CMA

## 2019-11-16 NOTE — Progress Notes (Signed)
Phone line is down at [facility]. May have to fax over order to [phone]. Will try later today. Salvatore Marvel, CMA

## 2019-11-17 NOTE — Progress Notes (Signed)
Called [facility] to schedule this appointment and order hasn't been signed by provider.  Will send to MD to authorize the change in this imaging before we can schedule it.  Taiya Nutting,CMA

## 2019-11-21 ENCOUNTER — Other Ambulatory Visit: Payer: Self-pay

## 2019-11-21 ENCOUNTER — Other Ambulatory Visit: Payer: Self-pay | Admitting: Family Medicine

## 2019-11-21 ENCOUNTER — Encounter: Payer: Self-pay | Admitting: Family Medicine

## 2019-11-21 ENCOUNTER — Ambulatory Visit (INDEPENDENT_AMBULATORY_CARE_PROVIDER_SITE_OTHER): Payer: 59 | Admitting: Family Medicine

## 2019-11-21 VITALS — BP 118/60 | HR 76 | Ht 66.0 in | Wt 168.0 lb

## 2019-11-21 DIAGNOSIS — R011 Cardiac murmur, unspecified: Secondary | ICD-10-CM | POA: Insufficient documentation

## 2019-11-21 DIAGNOSIS — F4321 Adjustment disorder with depressed mood: Secondary | ICD-10-CM | POA: Diagnosis not present

## 2019-11-21 DIAGNOSIS — R1900 Intra-abdominal and pelvic swelling, mass and lump, unspecified site: Secondary | ICD-10-CM

## 2019-11-21 DIAGNOSIS — D7389 Other diseases of spleen: Secondary | ICD-10-CM

## 2019-11-21 DIAGNOSIS — F4320 Adjustment disorder, unspecified: Secondary | ICD-10-CM | POA: Insufficient documentation

## 2019-11-21 DIAGNOSIS — G5601 Carpal tunnel syndrome, right upper limb: Secondary | ICD-10-CM | POA: Diagnosis not present

## 2019-11-21 NOTE — Assessment & Plan Note (Signed)
No SI/HI.  PHQ-9 score of 16 today.  She noted that she is not particularly interested in reaching out to a therapist at this time although she would take a list of resources. -A list of local therapists was provided.  She was encouraged to reach out to establish care.

## 2019-11-21 NOTE — Progress Notes (Signed)
New order for MRI placed. Matilde Haymaker, MD

## 2019-11-21 NOTE — Assessment & Plan Note (Signed)
1/6 systolic murmur.  Crescendo quality.  Loudest at right sternal border.  No appreciable radiation to the carotids.  She denies difficulty keeping up with other people her age during physical activities.  She denies any shortness of breath while lying flat at night.  She has no evidence of lower extremity edema.  We will continue to monitor for now and assess at next visit in 2 weeks.  If still present, will consider an echocardiogram.  In the setting of her splenic lesions, endocarditis is on the differential although causes unrelated to her splenic lesions are certainly possible.  No evidence of anemia from recent blood work.  We will follow-up TSH today.

## 2019-11-21 NOTE — Assessment & Plan Note (Signed)
Physical exam and history are consistent with carpal tunnel syndrome. -Wrist splint to wear at night for 6 weeks -Motrin as needed -Follow-up in 6 weeks if no improvement

## 2019-11-21 NOTE — Progress Notes (Signed)
SUBJECTIVE:   CHIEF COMPLAINT / HPI:   Splenic lesions First discovered on CT during her hospitalization for pancreatitis.  The work-up so far has consisted of a abdominal CT with contrast showed hypodensities in the spleen.  A left upper quadrant abdominal ultrasound verified the lesions but did not provide further characterization.  Following discussion with radiology, and MR abdomen with and without contrast was ordered.  There initially issues in the order but has now been reordered.  Ms. Burt Knack B reports that she does experience occasional left upper quadrant discomfort.  She denies nausea or vomiting.  She denies decreased appetite, night sweats, fevers.  Carpal tunnel syndrome She has been experiencing some right hand numbness for weeks now.  She notes that she has numbness primarily in her right index finger but she occasionally notices some numbness in her right middle finger as well.  She also occasionally has some pain at the base of her right middle finger.  She is not report any trauma.  Adjustment disorder For the past several months, she reports increased stress and depressive symptoms especially associated with work.  She notes that she seems to frequently have to pick up shifts for her coworkers and has to carry the weight of added responsibility for her coworkers being absent.  She often gets out of work at 3-5 in the morning.  She denies SI/HI.   PERTINENT  PMH / PSH: Splenic lesions, diabetes, pancreatitis  OBJECTIVE:   BP 118/60   Pulse 76   Ht 5\' 6"  (1.676 m)   Wt 168 lb (76.2 kg)   LMP 11/25/2012   SpO2 98%   BMI 27.12 kg/m    General: Well-appearing middle-aged woman.  No acute distress. Cardiac: Regular rate and rhythm.  1/6 systolic murmur loudest at the right sternal border no appreciable radiation to the carotids. Respiratory: Breathing comfortably on room air.  Clear to auscultation bilaterally Lower extremities: No lower extremity edema.  Warm,  dry.  Right wrist: Inspection: No gross abnormalities.  Hands are symmetric bilaterally. Palpation: No significant tenderness to palpation although numbness noted on exam. Vascularly intact. Diminished sensation to light touch noticed along second and third fingers.  Special tests: Positive Tinel test.  Positive Phalen's test.  ASSESSMENT/PLAN:   Carpal tunnel syndrome Physical exam and history are consistent with carpal tunnel syndrome. -Wrist splint to wear at night for 6 weeks -Motrin as needed -Follow-up in 6 weeks if no improvement  Splenic lesion -Ms. Samet will receive a call to schedule her MR -Follow-up MR abdomen  Adjustment disorder No SI/HI.  PHQ-9 score of 16 today.  She noted that she is not particularly interested in reaching out to a therapist at this time although she would take a list of resources. -A list of local therapists was provided.  She was encouraged to reach out to establish care.   Systolic murmur 1/6 systolic murmur.  Crescendo quality.  Loudest at right sternal border.  No appreciable radiation to the carotids.  She denies difficulty keeping up with other people her age during physical activities.  She denies any shortness of breath while lying flat at night.  She has no evidence of lower extremity edema.  We will continue to monitor for now and assess at next visit in 2 weeks.  If still present, will consider an echocardiogram.  In the setting of her splenic lesions, endocarditis is on the differential although causes unrelated to her splenic lesions are certainly possible.  No evidence of  anemia from recent blood work.  We will follow-up TSH today.  Follow-up in 2 weeks for diabetes management.   Matilde Haymaker, MD [place]

## 2019-11-21 NOTE — Assessment & Plan Note (Signed)
-  Ms. Yankey will receive a call to schedule her MR -Follow-up MR abdomen

## 2019-11-21 NOTE — Patient Instructions (Addendum)
Splenic lesions: Thank you for being patient with [place] in the work-up so far.  The neck step in your work-up is going to be to get an MRI of your spleen to better characterize these lesions.  I will let you know when we have more information.  Carpal tunnel syndrome: The symptoms you are describing for your right hand for perfectly with carpal tunnel syndrome.  I have provided a prescription for a wrist splint for your right wrist.  Please wear this at night for the next 6 weeks and let see how your symptoms do.  If you do not experience improvement in that time, please come back and we can readdress the problem.  You can also try taking Motrin occasionally to help reduce inflammation associated with this.  Adjustment disorder: I know you mentioned some depressive symptoms today that she has been going on for the past several months.  Sounds like work is very stressful at the moment.  I know you were not particularly interested in seeing a therapist at this time.  I have attached information below about therapists in [place] in case you change your mind and would like someone to talk to.  Heart murmur: I am not going to order any tests right now.  For now, let us wait and see if there are any changes at your next visit.  If this heart murmur still present at your next visit, we may move forward with more imaging.  Diabetes: Template we have more talk about and when able to address diabetes today.  Please come back in 2 weeks and we will talk more about diabetes.   Therapy and Counseling Resources Most providers on this list will take Medicaid. Patients with commercial insurance or Medicare should contact their insurance company to get a list of in network providers.  [employer]  [address] [place], [place] [address]      [place] [address]  [place], [place] [address] [phone]  [place] [address]  [place]  [place], [place] [address]       [phone]      [facility] [address], [place], [place]  [facility]:  [place]. [place]  [facility]:  [place] [address] Loni Muse, [place]  [facility] (under & uninsured) [address] [place] [phone]    [email or web address]    [facility]     Phone:  [phone]     [place]  [place] [address]      [place], [place]: [place], [place], [place]   [place] ([place] therapist) [address] [place] [address]   [place] [address]    [phone]    [facility]   [place],  [place], [place]   [place]  [place]  [address] [place], [place]        [phone]  [facility] Access[facility]Walk In Clinic under & uninsured  [place], To schedule an appointment call [phone] [address] [place] [phone]):  Molli Knock - Fri from 8 AM -  3 PM Moving June 1 to [employer] at [place], [place],  ([place])   [place], [place]: [phone]) 8:30 - 12; 1 - 2:30  [facility] [place],  [place], [place]    [phone]):8:30 - 12; 2 - 3PM  [facility] [employer],  [address],  [place]; [phone]):   Mon - Fri 8 AM - 5 PM  [facility] [place]  MWF 12:30 to 3:00 or call to schedule an appointment  [phone]  Specific Provider options Psychology Today  [email or web address]/us 1. click on find a therapist  2. enter  your zip code 3. left side and select or tailor a therapist for your specific need.   [facility] Provider Directory [email or web address]  (Medicaid)   Follow all drop down to find a provider  [place] [phone] or [email or web address] [address], [place], [place] Recovery support and educational   24- Hour Availability:  . [place] or [phone]  . [facility] [employer] [phone]  [facility]  [phone]   . [place]  [phone] (after hours)  . Therapeutic Alternative/Mobile Crisis   [phone]  . [place] National Suicide Hotline  [phone] ([place])  . Call 911 or go to emergency room  . [employer]  [phone]);  [place] and [employer]   . [facility]  [phone]); [place], [place], [place], [place], [place], [place], [place]

## 2019-11-21 NOTE — Progress Notes (Signed)
Patient aware that [facility] will call her to schedule this.  Sally Mclaughlin,CMA

## 2019-11-22 LAB — TSH: TSH: 1.5 u[IU]/mL (ref 0.450–4.500)

## 2019-11-23 ENCOUNTER — Encounter: Payer: Self-pay | Admitting: Family Medicine

## 2019-12-05 ENCOUNTER — Ambulatory Visit (INDEPENDENT_AMBULATORY_CARE_PROVIDER_SITE_OTHER): Payer: 59 | Admitting: Family Medicine

## 2019-12-05 ENCOUNTER — Encounter: Payer: Self-pay | Admitting: Family Medicine

## 2019-12-05 ENCOUNTER — Other Ambulatory Visit: Payer: Self-pay

## 2019-12-05 ENCOUNTER — Encounter: Payer: 59 | Attending: Family Medicine | Admitting: Skilled Nursing Facility1

## 2019-12-05 VITALS — BP 114/68 | HR 77 | Ht 66.0 in | Wt 166.0 lb

## 2019-12-05 DIAGNOSIS — R011 Cardiac murmur, unspecified: Secondary | ICD-10-CM

## 2019-12-05 DIAGNOSIS — E119 Type 2 diabetes mellitus without complications: Secondary | ICD-10-CM | POA: Insufficient documentation

## 2019-12-05 DIAGNOSIS — E1165 Type 2 diabetes mellitus with hyperglycemia: Secondary | ICD-10-CM

## 2019-12-05 DIAGNOSIS — D7389 Other diseases of spleen: Secondary | ICD-10-CM

## 2019-12-05 MED ORDER — OZEMPIC (0.25 OR 0.5 MG/DOSE) 2 MG/1.5ML ~~LOC~~ SOPN: 0.5000 mg | PEN_INJECTOR | SUBCUTANEOUS | 2 refills | Status: DC

## 2019-12-05 MED ORDER — EMPAGLIFLOZIN 10 MG PO TABS: 10.0000 mg | ORAL_TABLET | Freq: Every day | ORAL | 3 refills | Status: DC

## 2019-12-05 MED FILL — OZEMPIC 0.25 OR 0.5 MG/DOSE: 2 | 84 days supply | Qty: 5 | Fill #0

## 2019-12-05 NOTE — Progress Notes (Signed)
Pt states it is hard to check her blood sugars from fear of needles. Pt states she sleeps all day long stating she works 3rd shift. Pt had many complaints about coming to this appointment. Pt states she does not care about diabetes.  6pm-2:30am  Sleep at 6am Waking at 3pm  Pt states she takes her insulin every day at 5pm. Pt states she has to drik something to relax herself Pt states she hates taking medicine but takes her insulin.  Pt states she does not check her blood  Pt states she is interested in the continuous monitoring system.    Pt states she is very active at her work.  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  Pt states ever since her pancreatitis diagnosis she has cut back on her alcohol and cut out liquor.   A1C 17 down to 7.2  Education Topics: Meal time consistency and blood sugar control Hypoglycemia Self monitoring for glucose control Alcohol recommendations and blood sugar control/pancreatic health and its functions with insulin production   Previous Dx: Pancreatitis  DM type 2 hyperlipidemia 24 hr recall: Eating at 6am : eggs grits toast    First meal 9pm: baked chicken, field peas, [place] + gravy, potato salad  Second meal 11pm: pack of crackers   Beverages: water + flavorings , gingerale + water, liquor, beer 3 on each weekend day, wine   Goals: Meal at 3:30-4pm Meal at 9-10pm Meal at 4-5am Each meal being balanced with lean protein, complex carbohydrates, and non starchy vegetables  Aim to drink at minimum 64 fluid ounces a day

## 2019-12-05 NOTE — Assessment & Plan Note (Signed)
Follow up MRI

## 2019-12-05 NOTE — Patient Instructions (Addendum)
  Spleen lesions: Remembmber to go to your MRI appointment 12/22/2019 12:20 PM at [facility]  Diabetes: There is a lot of room to improve in this area.  We will make a number of changes today.  I like you to come back to clinic in 2 months to make sure we have been able to make these changes and see if your A1c is improving.  Here that we discussed today:  Metformin-increase to 2 pills (1000 mg) in the morning and 2 pills in the evening  Insulin-increase to 20 units daily.  I recommend that you start checking your blood glucose every morning if possible.  Ozempic -this is an injectable medication.  I am starting you on the lowest dose.  Please ask the pharmacist to demonstrate the appropriate way to use this medication.  You only need to take it once per week.  Please come back to clinic in 2 months.

## 2019-12-05 NOTE — Assessment & Plan Note (Signed)
Consistent with murmur noted at previous exam.  1/6 systolic murmur, loudest at left sternal border.  No significant radiation to the carotids.  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  Overall well-appearing.  No history consistent with heart failure at this time.  As this murmur is new and in the setting of splenic infarcts, we will move forward with an echocardiogram. -Follow-up echocardiogram

## 2019-12-05 NOTE — Progress Notes (Signed)
    SUBJECTIVE:   CHIEF COMPLAINT / HPI:   Diabetes A1c on 10/24/2019 was 14.3.  Her current diabetes medication includes Metformin 500 mg twice daily (though it is currently ordered as 1000 mg twice daily), insulin 12 units at night.  She denies current polydipsia although does note that she does have frequent urination.  She otherwise feels well without evidence of nausea or vomiting.  Splenic lesion Awaiting MRI results.  MRI currently scheduled for 8/6.   PERTINENT  PMH / PSH: Diabetes, alcoholic pancreatitis, heart murmur  OBJECTIVE:   BP 114/68   Pulse 77   Ht 5\' 6"  (1.676 m)   Wt 166 lb (75.3 kg)   LMP 11/25/2012   SpO2 98%   BMI 26.79 kg/m    General: Alert and cooperative and appears to be in no acute distress HEENT: Mildly dry oral mucous membranes. Cardio: Normal S1 and S2, no S3 or S4. Rhythm is regular.  1/6 systolic murmur loudest at left sternal border.    Pulm: Clear to auscultation bilaterally, no crackles, wheezing, or diminished breath sounds. Normal respiratory effort Abdomen: Bowel sounds normal. Abdomen soft and non-tender.  Extremities: No peripheral edema. Warm/ well perfused.  Strong radial pulse.   ASSESSMENT/PLAN:   Splenic lesion -Follow-up MRI  Diabetes mellitus type 2, uncontrolled ([place]) Poorly controlled.  She was recently seen by nutrition today had a flyer with nutrition guidance with her.  She reports that she is motivated to change her diet to help with better diabetes control.  We discussed that there are many facets that contribute to diabetes control which include nutrition, exercise, medications.  Made the following changes today: -Increase Metformin to 1000 mg twice daily -Increase Lantus to 20 units nightly -Start checking CBGs daily in the morning while fasting and record -Start Ozempic 0.25 mg per dose weekly -Follow-up in 2 months  Systolic murmur Consistent with murmur noted at previous exam.  1/6 systolic murmur, loudest at  left sternal border.  No significant radiation to the carotids.  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  Overall well-appearing.  No history consistent with heart failure at this time.  As this murmur is new and in the setting of splenic infarcts, we will move forward with an echocardiogram. -Follow-up echocardiogram     Matilde Haymaker, MD [place]

## 2019-12-05 NOTE — Assessment & Plan Note (Signed)
Poorly controlled.  She was recently seen by nutrition today had a flyer with nutrition guidance with her.  She reports that she is motivated to change her diet to help with better diabetes control.  We discussed that there are many facets that contribute to diabetes control which include nutrition, exercise, medications.  Made the following changes today: -Increase Metformin to 1000 mg twice daily -Increase Lantus to 20 units nightly -Start checking CBGs daily in the morning while fasting and record -Start Ozempic 0.25 mg per dose weekly -Follow-up in 2 months

## 2019-12-06 ENCOUNTER — Telehealth: Payer: Self-pay | Admitting: Family Medicine

## 2019-12-06 NOTE — Telephone Encounter (Signed)
Called pt and sch 1st dose in [place] for sat 11/19/22.

## 2019-12-09 ENCOUNTER — Ambulatory Visit: Payer: 59

## 2019-12-12 ENCOUNTER — Ambulatory Visit (HOSPITAL_COMMUNITY): Admission: RE | Admit: 2019-12-12 | Payer: 59 | Source: Ambulatory Visit

## 2019-12-21 ENCOUNTER — Encounter (HOSPITAL_COMMUNITY): Payer: Self-pay | Admitting: Family Medicine

## 2019-12-21 MED FILL — OZEMPIC 0.25 OR 0.5 MG/DOSE: 2 | 84 days supply | Qty: 5 | Fill #0

## 2019-12-21 MED FILL — BASAGLAR 100 UNIT/ML KWIKPE: 100 | 25 days supply | Qty: 3 | Fill #2

## 2019-12-21 MED FILL — ATORVASTATIN 80 MG TABLET: 80 | 90 days supply | Qty: 90 | Fill #1

## 2019-12-21 MED FILL — METFORMIN HCL 500 MG TABS: 500 | 90 days supply | Qty: 360 | Fill #1

## 2019-12-22 ENCOUNTER — Other Ambulatory Visit: Payer: 59

## 2020-01-01 ENCOUNTER — Telehealth (HOSPITAL_COMMUNITY): Payer: Self-pay | Admitting: Family Medicine

## 2020-01-01 NOTE — Telephone Encounter (Signed)
Just an FYI. We have made several attempts to contact this patient including sending a letter to schedule or reschedule their echocardiogram. We will be removing the patient from the echo WQ.   MAILED LETTER [facility]  12/21/19 [facility] to reschedule NS @ 11:54/LBW  12/12/19 Patient no showed/LBW      Thank you

## 2020-01-15 ENCOUNTER — Other Ambulatory Visit: Payer: Self-pay

## 2020-01-15 ENCOUNTER — Ambulatory Visit
Admission: RE | Admit: 2020-01-15 | Discharge: 2020-01-15 | Disposition: A | Discharge status: Home / Self Care | Payer: 59 | Source: Ambulatory Visit | Attending: Family Medicine | Admitting: Family Medicine

## 2020-01-15 DIAGNOSIS — D7389 Other diseases of spleen: Secondary | ICD-10-CM | POA: Diagnosis not present

## 2020-01-15 DIAGNOSIS — R1900 Intra-abdominal and pelvic swelling, mass and lump, unspecified site: Secondary | ICD-10-CM

## 2020-01-15 DIAGNOSIS — R161 Splenomegaly, not elsewhere classified: Secondary | ICD-10-CM | POA: Diagnosis not present

## 2020-01-15 MED ORDER — GADOBENATE DIMEGLUMINE 529 MG/ML IV SOLN
15.0000 mL | Freq: Once | INTRAVENOUS | Status: AC | PRN
  Administered 2020-01-15: 15 mL via INTRAVENOUS

## 2020-01-19 ENCOUNTER — Other Ambulatory Visit: Payer: Self-pay | Admitting: Family Medicine

## 2020-01-19 DIAGNOSIS — I10 Essential (primary) hypertension: Secondary | ICD-10-CM

## 2020-01-19 MED ORDER — LISINOPRIL-HYDROCHLOROTHIAZIDE 20-12.5 MG PO TABS: 1.0000 | ORAL_TABLET | Freq: Every day | ORAL | 3 refills | Status: DC

## 2020-01-23 MED FILL — LISINOPRIL-HCTZ 20-12.5 MG: 20-12.5 | 90 days supply | Qty: 90 | Fill #0

## 2021-03-10 IMAGING — DX DG CHEST 2V
2 series · 2 of 2 positions shown · non-contrast
Comparison: None.

CLINICAL DATA: Chest pain

EXAM:
CHEST - 2 VIEW

[chest pa]
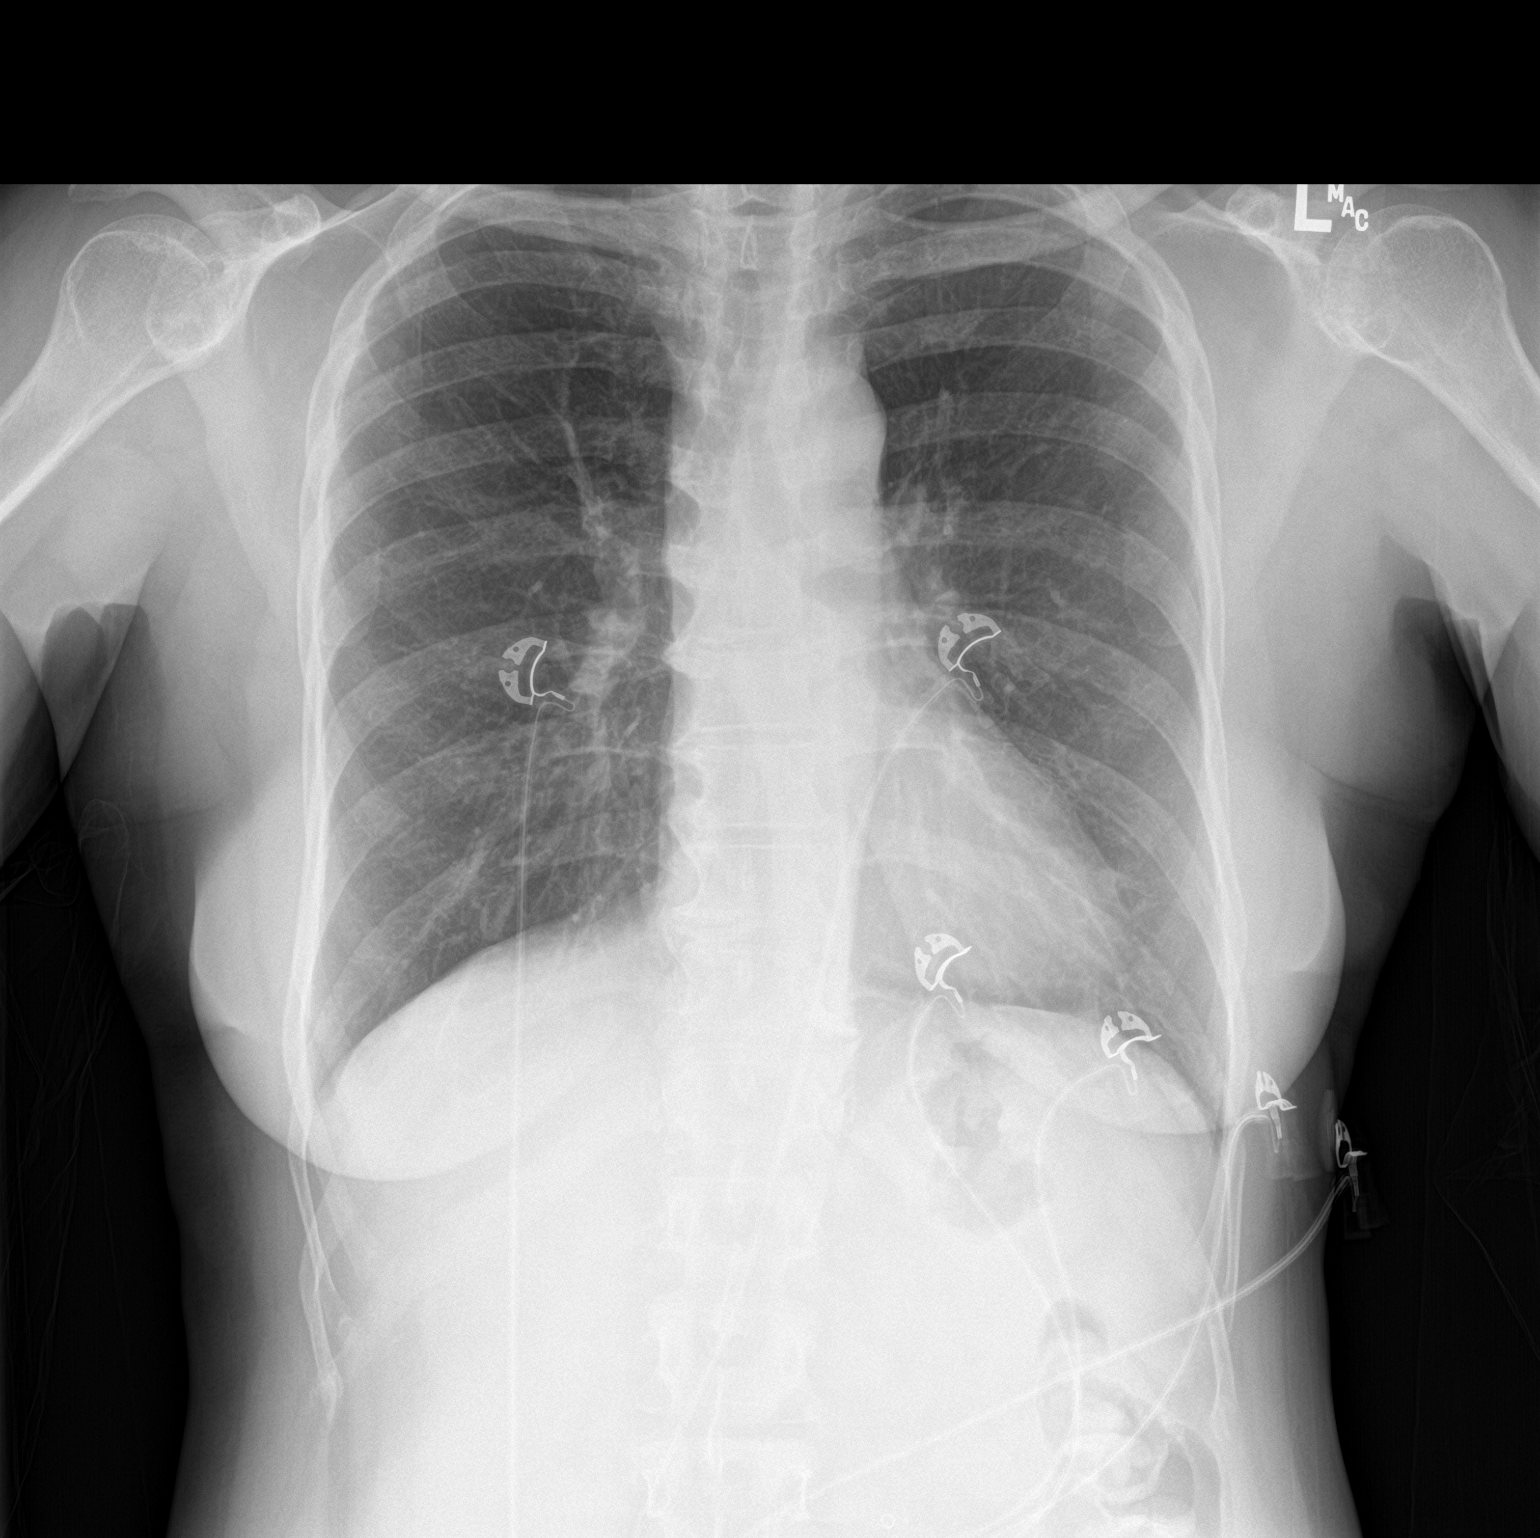

[chest lat]
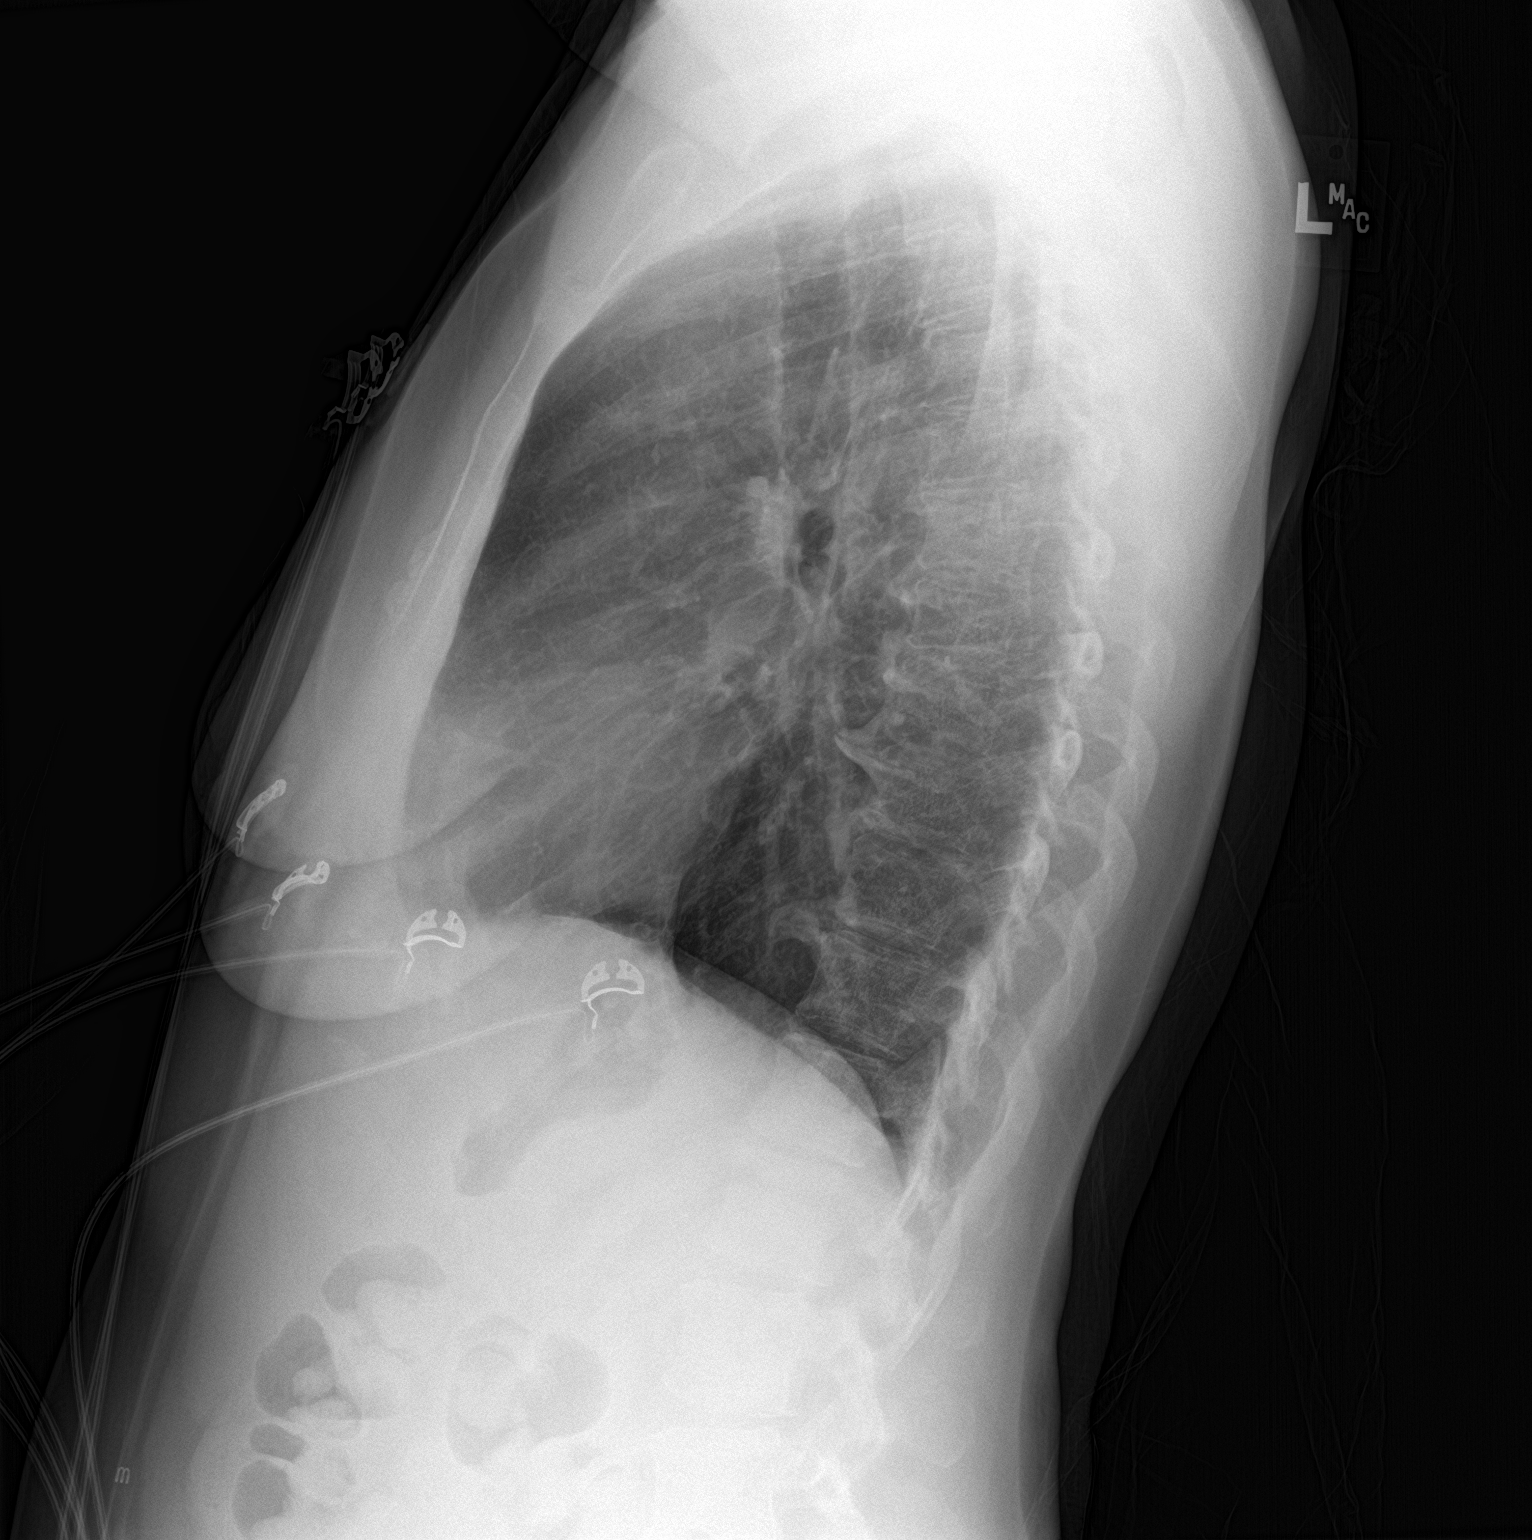

[2 of 2 positions shown; findings below may reference images not displayed]

FINDINGS: Normal heart size and mediastinal contours. No acute infiltrate or
edema. No effusion or pneumothorax. Degenerative endplate spurring
no acute osseous findings.
IMPRESSION: No active cardiopulmonary disease.

## 2021-03-11 IMAGING — US US ABDOMEN LIMITED
1 series · 14 of 25 positions shown · non-contrast
Comparison: Abdominal CT from earlier today

CLINICAL DATA: Abnormal spleen by CT.

EXAM:
ULTRASOUND ABDOMEN LIMITED

[Series 1: us abdomen limited · 14 of 32 slices shown]
[im 1/32]
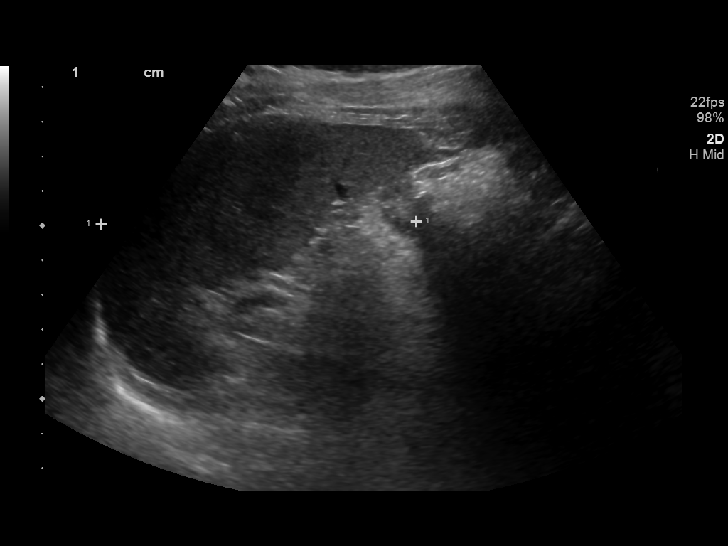
[im 3/32]
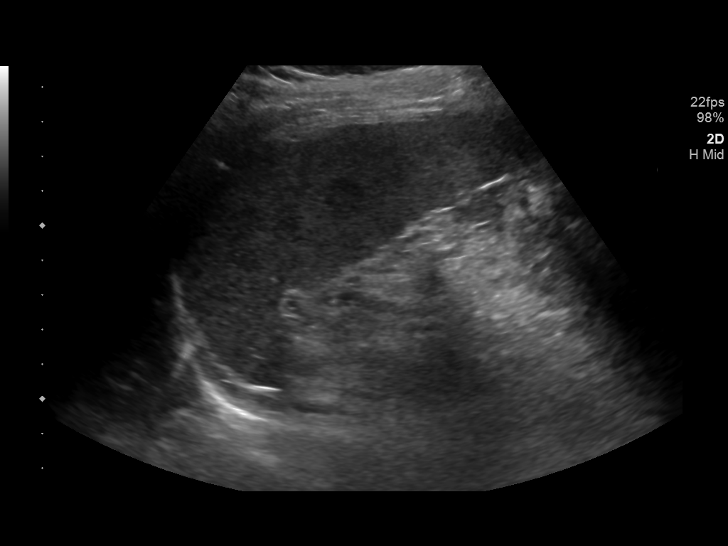
[im 6/32]
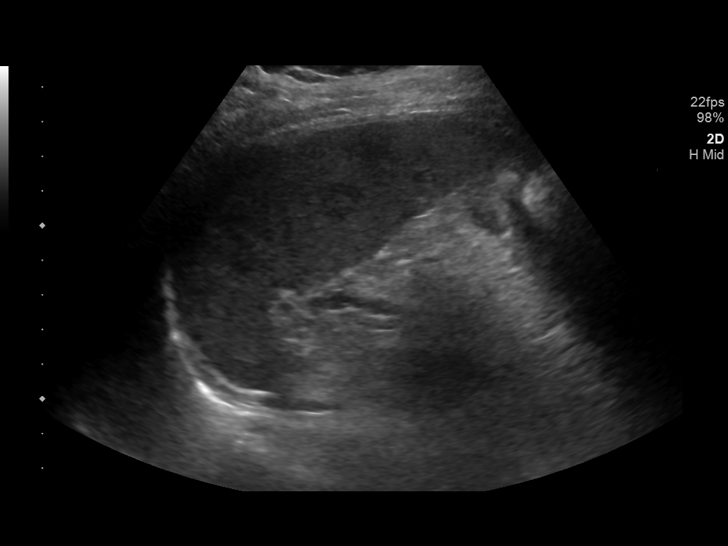
[im 8/32]
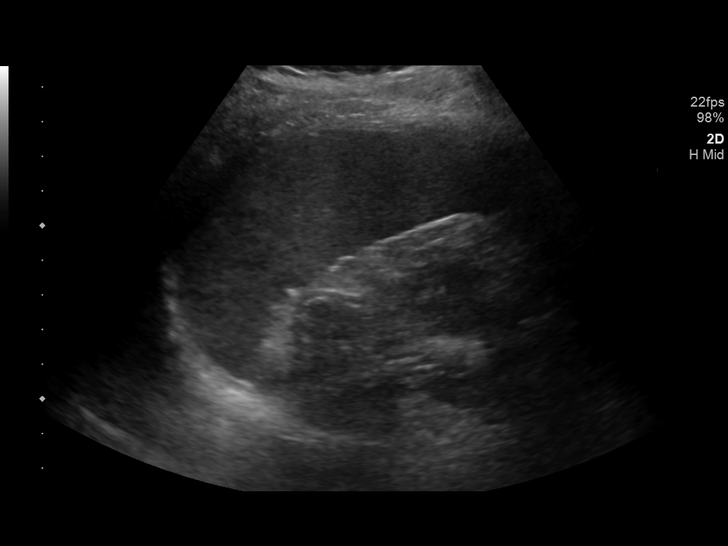
[im 11/32]
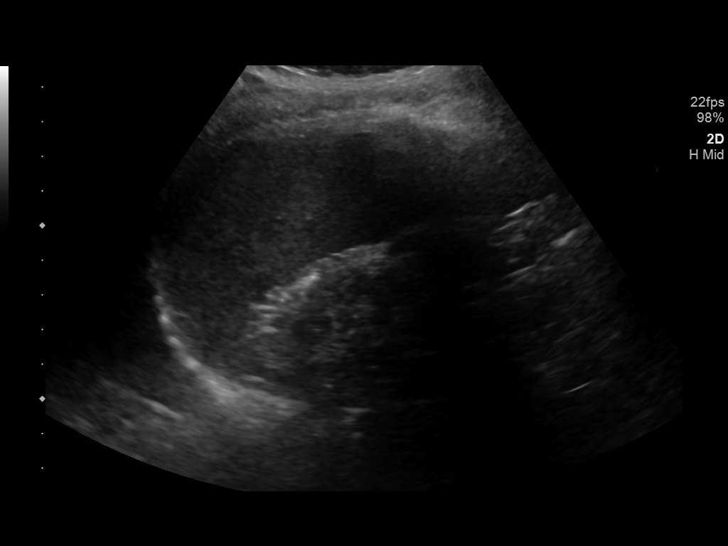
[im 12/32]
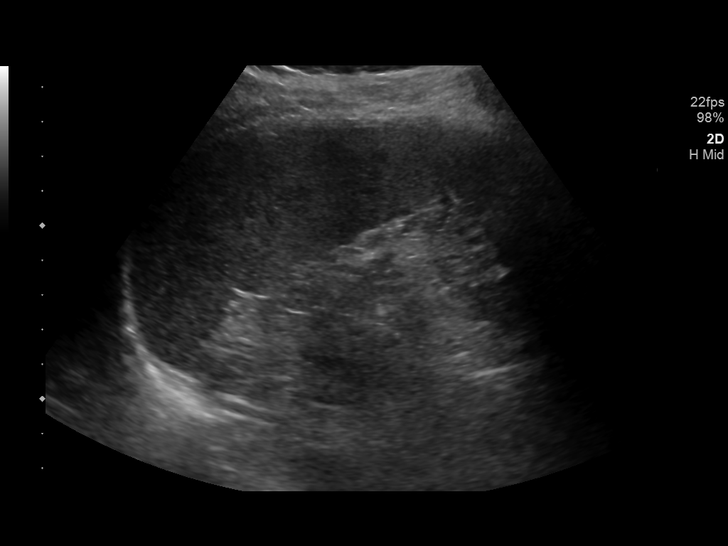
[im 15/32]
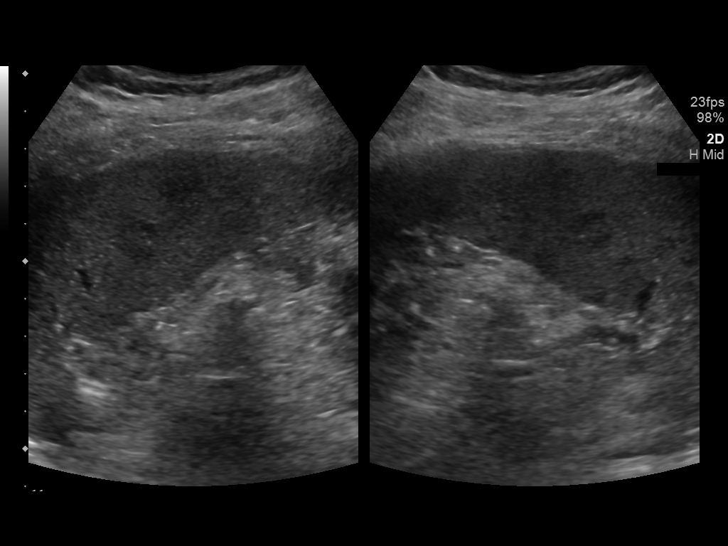
[im 17/32]
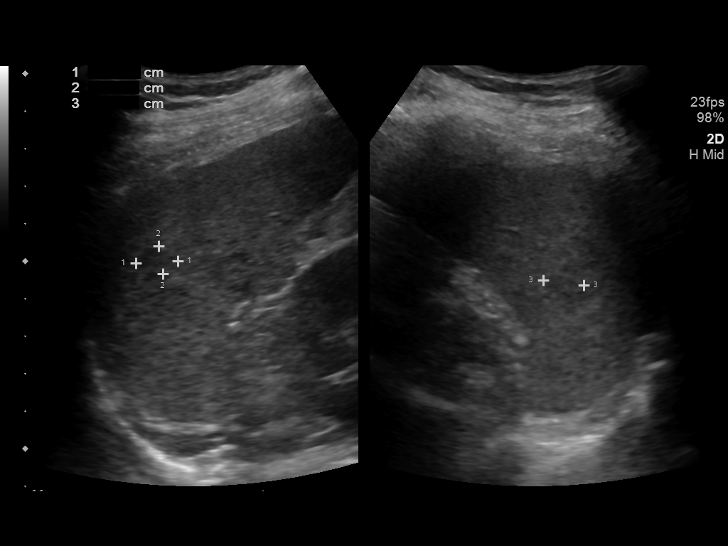
[im 20/32]
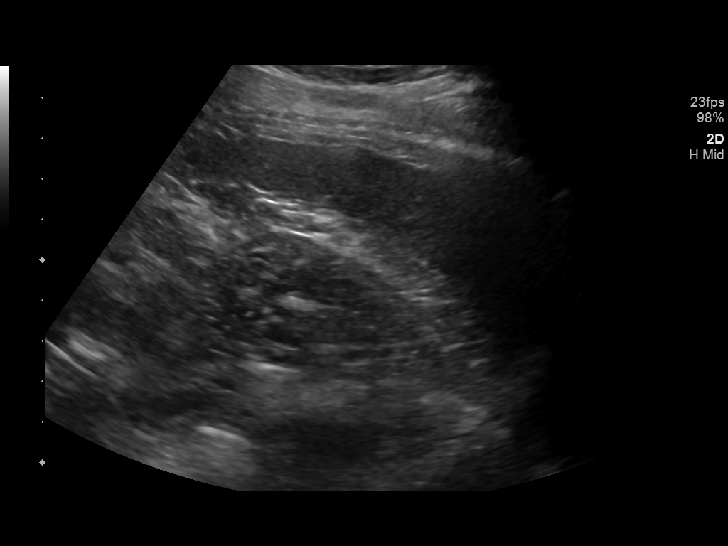
[im 21/32]
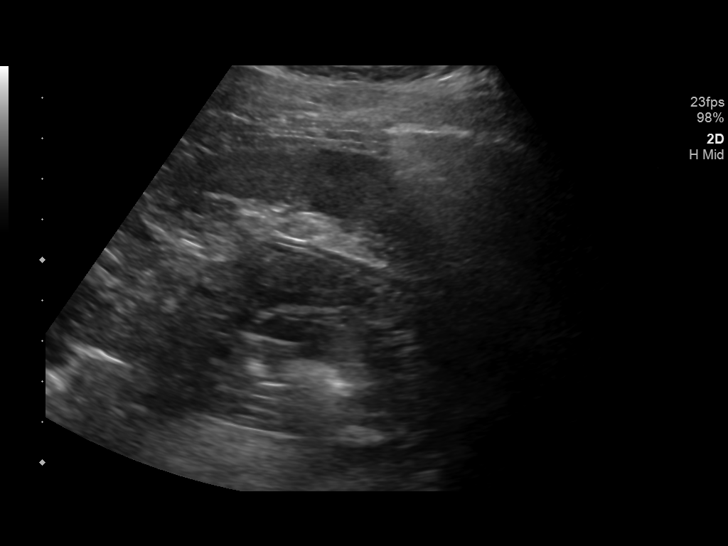
[im 24/32]
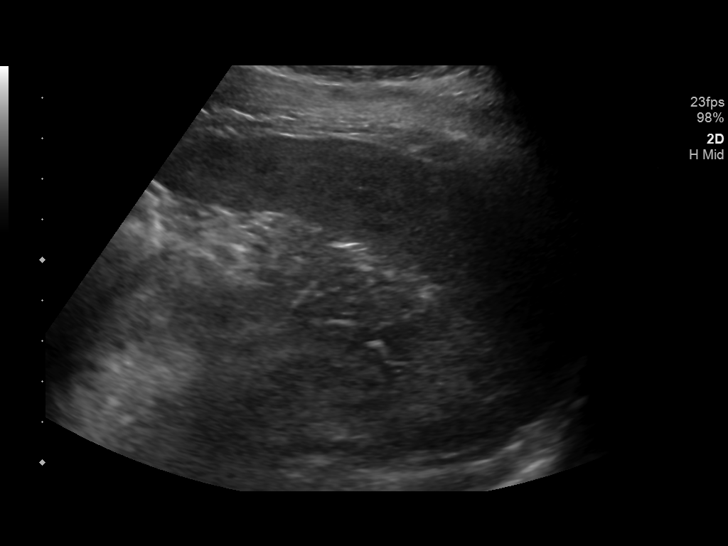
[im 26/32]
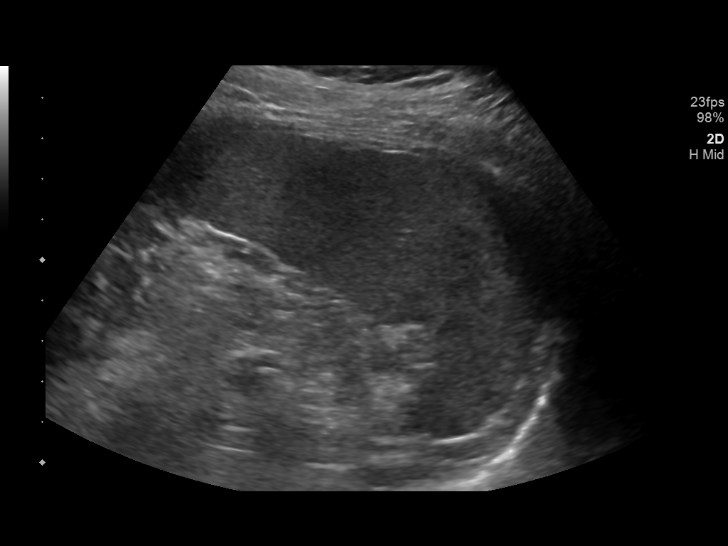
[im 29/32]
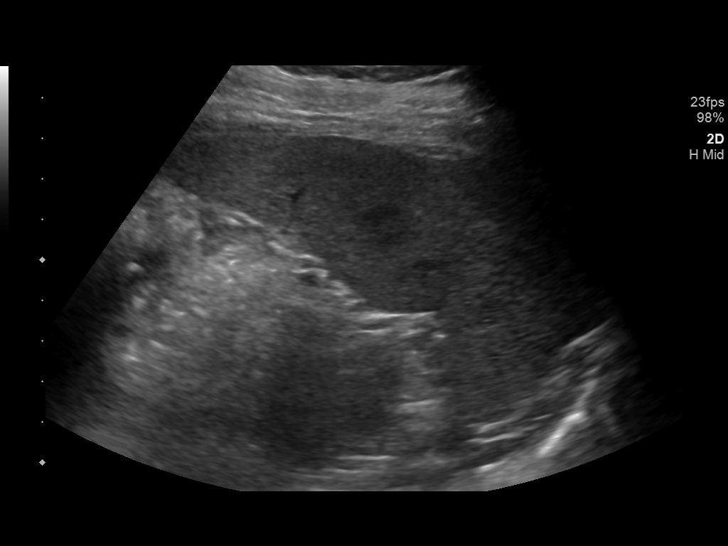
[im 32/32]
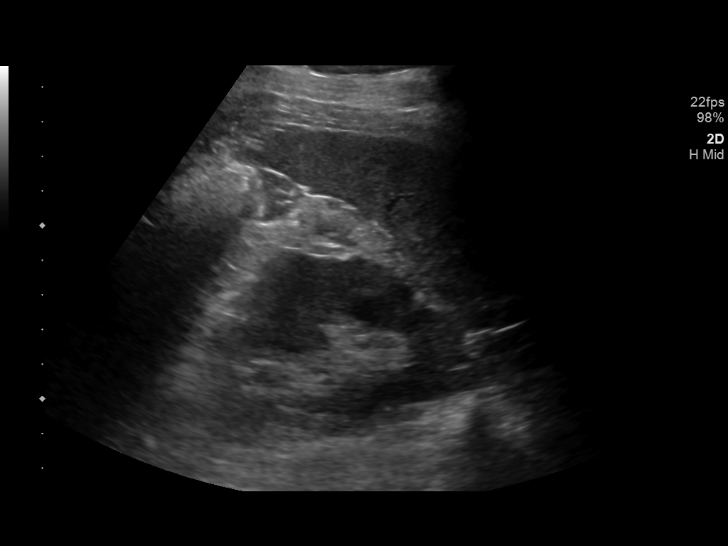

[14 of 25 positions shown; findings below may reference images not displayed]

FINDINGS: Ultrasound confirms hypoechoic rounded lesions throughout the
spleen. Splenic size is within normal limits. The lesions do not
have a specific ultrasound appearance and findings such as this are
commonly related to granulomatous disease, lymphoproliferative
disease, or metastatic disease (if there is known malignancy).
IMPRESSION: Confirmed true lesions within the spleen without specific diagnostic
feature, please see above.

## 2022-07-06 ENCOUNTER — Emergency Department (HOSPITAL_BASED_OUTPATIENT_CLINIC_OR_DEPARTMENT_OTHER)
Admission: EM | Admit: 2022-07-06 | Discharge: 2022-07-06 | Disposition: A | Discharge status: Home / Self Care | Payer: Commercial Managed Care - PPO | Attending: Emergency Medicine | Admitting: Emergency Medicine

## 2022-07-06 ENCOUNTER — Other Ambulatory Visit (HOSPITAL_BASED_OUTPATIENT_CLINIC_OR_DEPARTMENT_OTHER): Payer: Self-pay

## 2022-07-06 ENCOUNTER — Encounter (HOSPITAL_BASED_OUTPATIENT_CLINIC_OR_DEPARTMENT_OTHER): Payer: Self-pay

## 2022-07-06 DIAGNOSIS — E119 Type 2 diabetes mellitus without complications: Secondary | ICD-10-CM | POA: Insufficient documentation

## 2022-07-06 DIAGNOSIS — B3731 Acute candidiasis of vulva and vagina: Secondary | ICD-10-CM | POA: Diagnosis not present

## 2022-07-06 DIAGNOSIS — N898 Other specified noninflammatory disorders of vagina: Secondary | ICD-10-CM | POA: Diagnosis not present

## 2022-07-06 DIAGNOSIS — Z7982 Long term (current) use of aspirin: Secondary | ICD-10-CM | POA: Diagnosis not present

## 2022-07-06 DIAGNOSIS — Z7984 Long term (current) use of oral hypoglycemic drugs: Secondary | ICD-10-CM | POA: Insufficient documentation

## 2022-07-06 DIAGNOSIS — Z794 Long term (current) use of insulin: Secondary | ICD-10-CM | POA: Insufficient documentation

## 2022-07-06 DIAGNOSIS — N7689 Other specified inflammation of vagina and vulva: Secondary | ICD-10-CM | POA: Diagnosis present

## 2022-07-06 LAB — CBC WITH DIFFERENTIAL/PLATELET
Abs Immature Granulocytes: 0.02 10*3/uL (ref 0.00–0.07)
Basophils Absolute: 0 10*3/uL (ref 0.0–0.1)
Basophils Relative: 0 %
Eosinophils Absolute: 0.1 10*3/uL (ref 0.0–0.5)
Eosinophils Relative: 1 %
HCT: 38.3 % (ref 36.0–46.0)
Hemoglobin: 13.3 g/dL (ref 12.0–15.0)
Immature Granulocytes: 0 %
Lymphocytes Relative: 16 %
Lymphs Abs: 0.9 10*3/uL (ref 0.7–4.0)
MCH: 30 pg (ref 26.0–34.0)
MCHC: 34.7 g/dL (ref 30.0–36.0)
MCV: 86.3 fL (ref 80.0–100.0)
Monocytes Absolute: 0.2 10*3/uL (ref 0.1–1.0)
Monocytes Relative: 4 %
Neutro Abs: 4.2 10*3/uL (ref 1.7–7.7)
Neutrophils Relative %: 79 %
Platelets: 241 10*3/uL (ref 150–400)
RBC: 4.44 MIL/uL (ref 3.87–5.11)
RDW: 12.1 % (ref 11.5–15.5)
WBC: 5.4 10*3/uL (ref 4.0–10.5)
nRBC: 0 % (ref 0.0–0.2)

## 2022-07-06 LAB — WET PREP, GENITAL
Clue Cells Wet Prep HPF POC: NONE SEEN
Sperm: NONE SEEN
Trich, Wet Prep: NONE SEEN
WBC, Wet Prep HPF POC: >=10 — AB (ref <10)

## 2022-07-06 LAB — URINALYSIS, ROUTINE W REFLEX MICROSCOPIC
Bilirubin Urine: NEGATIVE
Glucose, UA: >=500 mg/dL — AB
Ketones, ur: NEGATIVE mg/dL
Nitrite: NEGATIVE
Protein, ur: >=300 mg/dL — AB
Specific Gravity, Urine: 1.02 (ref 1.005–1.030)
pH: 5.5 (ref 5.0–8.0)

## 2022-07-06 LAB — BASIC METABOLIC PANEL
Anion gap: 9 (ref 5–15)
BUN: 23 mg/dL — ABNORMAL HIGH (ref 6–20)
CO2: 25 mmol/L (ref 22–32)
Calcium: 9.3 mg/dL (ref 8.9–10.3)
Chloride: 102 mmol/L (ref 98–111)
Creatinine, Ser: 0.85 mg/dL (ref 0.44–1.00)
GFR, Estimated: >60 mL/min (ref >60)
Glucose, Bld: 417 mg/dL — ABNORMAL HIGH (ref 70–99)
Potassium: 4.5 mmol/L (ref 3.5–5.1)
Sodium: 136 mmol/L (ref 135–145)

## 2022-07-06 LAB — URINALYSIS, MICROSCOPIC (REFLEX)

## 2022-07-06 MED ORDER — FLUCONAZOLE 150 MG PO TABS
150.0000 mg | ORAL_TABLET | Freq: Every day | ORAL | 0 refills | Status: DC
  Filled 2022-07-06: qty 1, 1d supply, fill #0

## 2022-07-06 MED ORDER — FLUCONAZOLE 150 MG PO TABS
150.0000 mg | ORAL_TABLET | Freq: Once | ORAL | Status: AC
  Administered 2022-07-06: 150 mg via ORAL
  Filled 2022-07-06: qty 1

## 2022-07-06 NOTE — ED Triage Notes (Signed)
C/o vaginal burning/itching and swelling since Thursday. Denies vaginal discharge. Burning with urination.

## 2022-07-06 NOTE — ED Provider Notes (Signed)
[facility] Provider Note   CSN: [id number] Arrival date & time: 07/06/22  [id number]     History  Chief Complaint  Patient presents with   Vaginal Itching    Sally Mclaughlin is a 57 y.o. female.  Patient presents to the emergency department for evaluation of vaginal irritation and itching.  Patient has had significant burning with urination and irritation to the external vagina for the past several days.  Patient has a history of diabetes and has been off medications for approximately 3 years per her report.  No fevers.  She has tried some kind of topical ointment without relief.       Home Medications Prior to Admission medications   Medication Sig Start Date End Date Taking? Authorizing Provider  aspirin 81 MG tablet Take 1 tablet (81 mg total) by mouth daily. 12/18/16   Eloise Levels, MD  atorvastatin (LIPITOR) 80 MG tablet Take 1 tablet (80 mg total) by mouth daily. 09/07/19   Myles Gip, DO  Blood Glucose Monitoring Suppl (FREESTYLE LITE) DEVI 1 kit by Does not apply route daily. 09/07/19   Myles Gip, DO  calcium carbonate (TUMS - DOSED IN MG ELEMENTAL CALCIUM) 500 MG chewable tablet Chew 1 tablet (200 mg of elemental calcium total) by mouth 3 (three) times daily as needed for indigestion or heartburn. 06/15/18   Lucila Maine C, DO  glucose blood (FREESTYLE LITE) test strip Use as instructed 09/07/19   Myles Gip, DO  ibuprofen (ADVIL) 200 MG tablet Take 200 mg by mouth every 6 (six) hours as needed for moderate pain.    [provider]  Insulin Glargine (BASAGLAR KWIKPEN) 100 UNIT/ML Inject 0.12 mLs (12 Units total) into the skin daily. 11/15/19   Myles Gip, DO  Insulin Pen Needle 31G X 5 MM MISC 1 Units by Does not apply route daily. 10/26/19   Harold Hedge, MD  Lancets (FREESTYLE) lancets Use as instructed 09/07/19   Myles Gip, DO  lisinopril-hydrochlorothiazide (ZESTORETIC) 20-12.5 MG  tablet Take 1 tablet by mouth daily. 01/19/20   Matilde Haymaker, MD  metFORMIN (GLUCOPHAGE) 500 MG tablet Take 2 tablets (1,000 mg total) by mouth 2 (two) times daily with a meal. 09/07/19   Rumball, Jake Church, DO  Semaglutide,0.25 or 0.5MG/DOS, (OZEMPIC, 0.25 OR 0.5 MG/DOSE,) 2 MG/1.5ML SOPN Inject 0.375 mLs (0.5 mg total) into the skin once a week. 12/05/19   Matilde Haymaker, MD      Allergies    Patient has no known allergies.    Review of Systems   Review of Systems  Physical Exam Updated Vital Signs BP (!) 156/89 (BP Location: Right Arm)   Pulse 96   Temp 97.8 F (36.6 C) (Oral)   Resp 18   Ht 5' 6"$  (1.676 m)   Wt 70.9 kg   LMP 11/25/2012   SpO2 100%   BMI 25.24 kg/m   Physical Exam Vitals and nursing note reviewed.  Constitutional:      General: She is not in acute distress.    Appearance: She is well-developed.  HENT:     Head: Normocephalic and atraumatic.     Right Ear: External ear normal.     Left Ear: External ear normal.     Nose: Nose normal.  Eyes:     Conjunctiva/sclera: Conjunctivae normal.  Cardiovascular:     Rate and Rhythm: Normal rate and regular rhythm.     Heart sounds:  No murmur heard. Pulmonary:     Effort: No respiratory distress.     Breath sounds: No wheezing, rhonchi or rales.  Abdominal:     Palpations: Abdomen is soft.     Tenderness: There is no abdominal tenderness. There is no guarding or rebound.  Genitourinary:    Comments: External GU exam performed, patient with erythema and inflammation of the labia.  There is some lotion that the patient applied, however there is also yellowish discharge.  Patient is very tender.  Unable to tolerate speculum exam.  Overall exam consistent with candidiasis. Musculoskeletal:     Cervical back: Normal range of motion and neck supple.     Right lower leg: No edema.     Left lower leg: No edema.  Skin:    General: Skin is warm and dry.     Findings: No rash.  Neurological:     General: No focal deficit  present.     Mental Status: She is alert. Mental status is at baseline.     Motor: No weakness.  Psychiatric:        Mood and Affect: Mood normal.     ED Results / Procedures / Treatments   Labs (all labs ordered are listed, but only abnormal results are displayed) Labs Reviewed  WET PREP, GENITAL - Abnormal; Notable for the following components:      Result Value   Yeast Wet Prep HPF POC PRESENT (*)    WBC, Wet Prep HPF POC >=10 (*)    All other components within normal limits  URINALYSIS, ROUTINE W REFLEX MICROSCOPIC - Abnormal; Notable for the following components:   APPearance CLOUDY (*)    Glucose, UA >=500 (*)    Hgb urine dipstick MODERATE (*)    Protein, ur >=300 (*)    Leukocytes,Ua SMALL (*)    All other components within normal limits  URINALYSIS, MICROSCOPIC (REFLEX) - Abnormal; Notable for the following components:   Bacteria, UA MANY (*)    All other components within normal limits  BASIC METABOLIC PANEL - Abnormal; Notable for the following components:   Glucose, Bld 417 (*)    BUN 23 (*)    All other components within normal limits  CBC WITH DIFFERENTIAL/PLATELET    EKG None  Radiology No results found.  Procedures Procedures    Medications Ordered in ED Medications  fluconazole (DIFLUCAN) tablet 150 mg (has no administration in time range)    ED Course/ Medical Decision Making/ A&P    Patient seen and examined. History obtained directly from patient. Work-up including labs, imaging, EKG ordered in triage, if performed, were reviewed.    Labs/EKG: Independently reviewed and interpreted.  This included: UA with large glucose and yeast.  Added CBC and BMP due to uncontrolled diabetes.  Will perform LE send external genitalia exam and try to get wet prep.  Imaging: None ordered  Medications/Fluids: None ordered  Most recent vital signs reviewed and are as follows: BP (!) 156/89 (BP Location: Right Arm)   Pulse 96   Temp 97.8 F (36.6 C)  (Oral)   Resp 18   Ht 5' 6"$  (1.676 m)   Wt 70.9 kg   LMP 11/25/2012   SpO2 100%   BMI 25.24 kg/m   Initial impression: Vaginal pain, likely yeast infection due to uncontrolled hyperglycemia  2:00 PM Pelvic exam performed with NT student chaperone.   3:01 PM Reassessment performed. Patient appears stable.  Labs personally reviewed and interpreted including: CBC unremarkable; BMP  with glucose 417 with normal anion gap; UA with yeast and glucose, microscopic not compelling for infection; wet prep showing yeast.  Reviewed pertinent lab work and imaging with patient at bedside. Questions answered.   Most current vital signs reviewed and are as follows: BP (!) 158/82   Pulse 75   Temp 98.3 F (36.8 C) (Oral)   Resp 20   Ht 5' 6"$  (1.676 m)   Wt 70.9 kg   LMP 11/25/2012   SpO2 100%   BMI 25.24 kg/m   Plan: Discharge to home.   Prescriptions written for: Diflucan.  Patient given first dose here and will take a second dose in 72 hours.  Other home care instructions discussed: Good hydration  ED return instructions discussed: Worsening pain and other concerns.  Follow-up instructions discussed: Patient encouraged to follow-up with their PCP in 7 days.  Discussed importance of hyperglycemia control and need for PCP follow-up to get this under control.                            Medical Decision Making Amount and/or Complexity of Data Reviewed Labs: ordered.   Patient with vaginal candidiasis infection, a lot of burning and tenderness.  She has uncontrolled hyperglycemia and has been off of medications for several years.  She has hyperglycemia today without evidence of DKA.  No signs of cellulitis.  Low concern for sepsis.  Will initiate treatment with Diflucan.  Strongly encouraged PCP follow-up for hyperglycemia control.  No indications for admission today.  The patient's vital signs, pertinent lab work and imaging were reviewed and interpreted as discussed in the ED course.  Hospitalization was considered for further testing, treatments, or serial exams/observation. However as patient is well-appearing, has a stable exam, and reassuring studies today, I do not feel that they warrant admission at this time. This plan was discussed with the patient who verbalizes agreement and comfort with this plan and seems reliable and able to return to the Emergency Department with worsening or changing symptoms.          Final Clinical Impression(s) / ED Diagnoses Final diagnoses:  Vaginal candidiasis    Rx / DC Orders ED Discharge Orders          Ordered    fluconazole (DIFLUCAN) 150 MG tablet  Daily        07/06/22 1457              Carlisle Cater, PA-C 07/06/22 1504    Fredia Sorrow, MD 07/08/22 1418

## 2022-07-06 NOTE — Discharge Instructions (Signed)
Please read and follow all provided instructions.  Your diagnoses today include:  1. Vaginal candidiasis    Tests performed today include: Blood counts and electrolytes: Shows elevated blood sugar Urine test: No definite infection but suggests yeast infection Wet prep: Show some yeast infection Vital signs. See below for your results today.   Medications prescribed:  Diflucan: Please take the dose of this medication on 07/09/2022  Take any prescribed medications only as directed.  Home care instructions:  Follow any educational materials contained in this packet.  BE VERY CAREFUL not to take multiple medicines containing Tylenol (also called acetaminophen). Doing so can lead to an overdose which can damage your liver and cause liver failure and possibly death.   Follow-up instructions: Please follow-up with your primary care provider in the next 7 days for further evaluation of your symptoms.  As we discussed it is very important that you get control of your blood sugar in order to prevent complications and other infections.  Return instructions:  Please return to the Emergency Department if you experience worsening symptoms.  Please return if you have any other emergent concerns.  Additional Information:  Your vital signs today were: BP (!) 158/82   Pulse 75   Temp 98.3 F (36.8 C) (Oral)   Resp 20   Ht 5' 6"$  (1.676 m)   Wt 70.9 kg   LMP 11/25/2012   SpO2 100%   BMI 25.24 kg/m  If your blood pressure (BP) was elevated above 135/85 this visit, please have this repeated by your doctor within one month. --------------

## 2022-07-16 NOTE — Progress Notes (Unsigned)
    SUBJECTIVE:   CHIEF COMPLAINT / HPI:   T2DM Patient not currently taking any medication. Last A1c14.3 in 2021.  A1c today is 12.8.  Patient states she drinks soda daily and some sweet tea. Was seen recently in the ED due to vaginal yeast infection which patient understands is likely related to her high blood sugar.  HTN Does not check BP at home. No chest pain, SOB, headache. She is okay with startin a blood pressure mediation today.   HLD Last lipid panel done in 2021 with total cholesterol 245, LDL 85.  Patient not currently taking a statin.   Yeast infection Seen at ED on 2/19 for vaginal yeast infection. Took one diflucan which did not resolve her symptoms. She is still experiencing itching, burning, and has white discharge.     OBJECTIVE:   BP (!) 145/80   Pulse 69   Ht '5\' 6"'$  (1.676 m)   Wt 162 lb 3.2 oz (73.6 kg)   LMP 11/25/2012   SpO2 100%   BMI 26.18 kg/m    General: NAD, pleasant, able to participate in exam Cardiac: RRR, no murmurs. Respiratory: CTAB, normal effort, No wheezes, rales or rhonchi Skin: warm and dry Neuro: alert, no obvious focal deficits Psych: Normal affect and mood  ASSESSMENT/PLAN:   Diabetes mellitus type 2, uncontrolled ([place]) Will start Ozempic and metformin today.  Possible side effects including GI symptoms discussed.  Patient also agrees to eliminate sugary drinks from her diet.  She was given handout on diabetes and nutrition and encouraged to read through this.  She will return in 2 weeks for follow-up.  Due for DM eye exam, foot exam, urine ACR -will discuss/perform at next visit.  Essential hypertension BP improved but still elevated on recheck.  Will start Zestoretic 20-12.5 mg today which patient previously took. -BMP -Return in 2 weeks for follow-up  Hyperlipidemia Patient previously took atorvastatin 80 mg daily, agrees to start this again today.  Possible side effects including myalgias discussed.  Yeast infection of  the vagina Diflucan 150 mg once, if symptoms persist after 3 days she should take the second tablet     Dr. Precious Gilding, [place]

## 2022-07-17 ENCOUNTER — Ambulatory Visit: Payer: Commercial Managed Care - PPO | Admitting: Student

## 2022-07-17 ENCOUNTER — Other Ambulatory Visit (HOSPITAL_COMMUNITY): Payer: Self-pay

## 2022-07-17 ENCOUNTER — Encounter: Payer: Self-pay | Admitting: Student

## 2022-07-17 VITALS — BP 145/80 | HR 69 | Ht 66.0 in | Wt 162.2 lb

## 2022-07-17 DIAGNOSIS — B3731 Acute candidiasis of vulva and vagina: Secondary | ICD-10-CM | POA: Diagnosis not present

## 2022-07-17 DIAGNOSIS — E0865 Diabetes mellitus due to underlying condition with hyperglycemia: Secondary | ICD-10-CM

## 2022-07-17 DIAGNOSIS — I10 Essential (primary) hypertension: Secondary | ICD-10-CM | POA: Diagnosis not present

## 2022-07-17 DIAGNOSIS — E785 Hyperlipidemia, unspecified: Secondary | ICD-10-CM

## 2022-07-17 LAB — POCT GLYCOSYLATED HEMOGLOBIN (HGB A1C): HbA1c, POC (controlled diabetic range): 12.8 % — AB (ref 0.0–7.0)

## 2022-07-17 MED ORDER — LISINOPRIL-HYDROCHLOROTHIAZIDE 20-12.5 MG PO TABS
1.0000 | ORAL_TABLET | Freq: Every day | ORAL | 3 refills | Status: DC
  Filled 2022-07-17: qty 90, 90d supply, fill #0

## 2022-07-17 MED ORDER — METFORMIN HCL ER 500 MG PO TB24
500.0000 mg | ORAL_TABLET | Freq: Two times a day (BID) | ORAL | 3 refills | Status: DC
  Filled 2022-07-17: qty 180, 90d supply, fill #0

## 2022-07-17 MED ORDER — SEMAGLUTIDE(0.25 OR 0.5MG/DOS) 2 MG/3ML ~~LOC~~ SOPN
0.2500 mg | PEN_INJECTOR | SUBCUTANEOUS | 0 refills | Status: DC
  Filled 2022-07-17: qty 3, 30d supply, fill #0

## 2022-07-17 MED ORDER — FLUCONAZOLE 150 MG PO TABS
150.0000 mg | ORAL_TABLET | ORAL | 0 refills | Status: DC
  Filled 2022-07-17: qty 2, 3d supply, fill #0

## 2022-07-17 MED ORDER — ATORVASTATIN CALCIUM 80 MG PO TABS
80.0000 mg | ORAL_TABLET | Freq: Every day | ORAL | 3 refills | Status: DC
  Filled 2022-07-17: qty 90, 90d supply, fill #0
  Filled 2022-11-24 (×2): qty 90, 90d supply, fill #1

## 2022-07-17 NOTE — Patient Instructions (Signed)
It was great to see you! Thank you for allowing me to participate in your care!  I recommend that you always bring your medications to each appointment as this makes it easy to ensure you are on the correct medications and helps [place] not miss when refills are needed.  Our plans for today:  - stop drinking any sugary beverages you consume on a regular basis -I encouraged you to see the attached information on the diabetic diet and try to follow it -I have sent in a prescription for metformin and Ozempic for diabetes -Start taking metformin 500 mg once daily for the first 3 to 4 days.  If you tolerate it well, increase to 500 mg twice daily. -Start Ozempic 0.25 mg injected weekly.  If you tolerate this well over the next 4 weeks, we will increase to 0.5 mg weekly. -I sent a prescription for a blood pressure lowering medication called Zestoretic.  Take daily.  If you can, I encourage you to check your blood pressures at home once a day and record them and bring them to your next appointment -I sent in a prescription for a cholesterol-lowering medication called atorvastatin, take it daily. -I sent in a prescription for fluconazole for yeast infections.  Take 1 tablet today, if you still have symptoms in 3 days take the second tablet -Return in 2 weeks for follow-up visit  We are checking some labs today, I will call you if they are abnormal will send you a MyChart message or a letter if they are normal.  If you do not hear about your labs in the next 2 weeks please let us know.  Take care and seek immediate care sooner if you develop any concerns.   Dr. Precious Gilding, DO [facility]

## 2022-07-18 DIAGNOSIS — B3731 Acute candidiasis of vulva and vagina: Secondary | ICD-10-CM | POA: Insufficient documentation

## 2022-07-18 LAB — BASIC METABOLIC PANEL
BUN/Creatinine Ratio: 25 — ABNORMAL HIGH (ref 9–23)
BUN: 21 mg/dL (ref 6–24)
CO2: 23 mmol/L (ref 20–29)
Calcium: 9.8 mg/dL (ref 8.7–10.2)
Chloride: 100 mmol/L (ref 96–106)
Creatinine, Ser: 0.85 mg/dL (ref 0.57–1.00)
Glucose: 365 mg/dL — ABNORMAL HIGH (ref 70–99)
Potassium: 5.2 mmol/L (ref 3.5–5.2)
Sodium: 136 mmol/L (ref 134–144)
eGFR: 79 mL/min/{1.73_m2} (ref >59)

## 2022-07-18 NOTE — Assessment & Plan Note (Signed)
BP improved but still elevated on recheck.  Will start Zestoretic 20-12.5 mg today which patient previously took. -BMP -Return in 2 weeks for follow-up

## 2022-07-18 NOTE — Assessment & Plan Note (Addendum)
Will start Ozempic and metformin today.  Possible side effects including GI symptoms discussed.  Patient also agrees to eliminate sugary drinks from her diet.  She was given handout on diabetes and nutrition and encouraged to read through this.  She will return in 2 weeks for follow-up.  Due for DM eye exam, foot exam, urine ACR -will discuss/perform at next visit.

## 2022-07-18 NOTE — Assessment & Plan Note (Signed)
Diflucan 150 mg once, if symptoms persist after 3 days she should take the second tablet

## 2022-07-18 NOTE — Assessment & Plan Note (Signed)
Patient previously took atorvastatin 80 mg daily, agrees to start this again today.  Possible side effects including myalgias discussed.

## 2022-07-20 ENCOUNTER — Encounter: Payer: Self-pay | Admitting: Student

## 2022-07-28 ENCOUNTER — Encounter: Payer: Self-pay | Admitting: Student

## 2022-07-28 ENCOUNTER — Ambulatory Visit: Payer: Commercial Managed Care - PPO | Admitting: Student

## 2022-07-28 VITALS — BP 128/70 | HR 83 | Ht 66.0 in | Wt 156.1 lb

## 2022-07-28 DIAGNOSIS — I10 Essential (primary) hypertension: Secondary | ICD-10-CM | POA: Diagnosis not present

## 2022-07-28 DIAGNOSIS — E0865 Diabetes mellitus due to underlying condition with hyperglycemia: Secondary | ICD-10-CM

## 2022-07-28 DIAGNOSIS — Z23 Encounter for immunization: Secondary | ICD-10-CM

## 2022-07-28 NOTE — Patient Instructions (Addendum)
It was great to see you! Thank you for allowing me to participate in your care!  I recommend that you always bring your medications to each appointment as this makes it easy to ensure you are on the correct medications and helps [place] not miss when refills are needed.  Our plans for today:  - Increase metformin to 500 mg (1 tablet) with breakfast and 1000 mg (2 tablets) with dinner for one week. Then further increase to 1000 mg (2 tablets) twice daily. -Continue you current blood pressure medication, Zestoretic - At the end of the month, we will increase ozempic to 0.5 mg injected weekly  -Call to see if you can get a sooner appointment with the eye doctor -Return within 2-4 weeks for a pap smear  We are checking some labs today, I will call you if they are abnormal will send you a MyChart message or a letter if they are normal.  If you do not hear about your labs in the next 2 weeks please let us know.  Take care and seek immediate care sooner if you develop any concerns.   Dr. Precious Gilding, DO [facility]

## 2022-07-28 NOTE — Progress Notes (Signed)
    SUBJECTIVE:   CHIEF COMPLAINT / HPI:   T2DM Patient started on Ozempic and metformin at last visit on 07/17/2022.  States she was able to start both on 07/17/22 and is tolerating them well. She has been taking metformin 500 mg BID. Has eye exam scheduled for 12/22/22 with ophthalmology.   HTN BP elevated at last visit, patient was started on Zestoretic 20-12.5 mg which she states she has been taking. No SOB, no CP, no changes in vision.   PERTINENT  PMH / PSH: HTN, T2DM, HLD  OBJECTIVE:   BP 128/70   Pulse 83   Ht 5\' 6"  (1.676 m)   Wt 156 lb 2 oz (70.8 kg)   LMP 11/25/2012   SpO2 100%   BMI 25.20 kg/m    General: NAD, pleasant, able to participate in exam Cardiac: RRR, normal S1/S2 Respiratory: CTAB, normal effort, No wheezes, rales or rhonchi Abdomen: Bowel sounds present, nontender, nondistended, no hepatosplenomegaly. Extremities: no edema or cyanosis.  Bilateral posterior tibial and dorsalis pedis pulses present, sensation intact of bilateral feet, no wounds of bilateral feet Skin: warm and dry, no rashes noted Neuro: alert, no obvious focal deficits Psych: Normal affect and mood  ASSESSMENT/PLAN:   Essential hypertension BP controlled.  Continue current regimen of Zestoretic -BMP today  Diabetes mellitus type 2, uncontrolled ([place]) Increase metformin to 500 mg in the morning and 1000 mg in the evening for 1 week.  Further increase to 1000 mg twice daily if tolerating well.  Will increase Ozempic to 0.5 mg at the end of the month.  Patient advised to call and see if she can get an earlier ophthalmology appointment for diabetic eye exam.  Diabetic foot exam completed today.   Patient to return in 2 to 4 weeks for Pap smear and follow-up  Dr. Precious Gilding, [place]

## 2022-07-29 ENCOUNTER — Other Ambulatory Visit: Payer: Self-pay | Admitting: Student

## 2022-07-29 ENCOUNTER — Other Ambulatory Visit (HOSPITAL_COMMUNITY): Payer: Self-pay

## 2022-07-29 ENCOUNTER — Telehealth: Payer: Self-pay | Admitting: Student

## 2022-07-29 DIAGNOSIS — I10 Essential (primary) hypertension: Secondary | ICD-10-CM

## 2022-07-29 LAB — BASIC METABOLIC PANEL
BUN/Creatinine Ratio: 25 — ABNORMAL HIGH (ref 9–23)
BUN: 35 mg/dL — ABNORMAL HIGH (ref 6–24)
CO2: 19 mmol/L — ABNORMAL LOW (ref 20–29)
Calcium: 9.6 mg/dL (ref 8.7–10.2)
Chloride: 101 mmol/L (ref 96–106)
Creatinine, Ser: 1.42 mg/dL — ABNORMAL HIGH (ref 0.57–1.00)
Glucose: 204 mg/dL — ABNORMAL HIGH (ref 70–99)
Potassium: 4.8 mmol/L (ref 3.5–5.2)
Sodium: 137 mmol/L (ref 134–144)
eGFR: 43 mL/min/{1.73_m2} — ABNORMAL LOW (ref >59)

## 2022-07-29 LAB — MICROALBUMIN / CREATININE URINE RATIO
Creatinine, Urine: 87.5 mg/dL
Microalb/Creat Ratio: 180 mg/g creat — ABNORMAL HIGH (ref 0–29)
Microalbumin, Urine: 157.3 ug/mL

## 2022-07-29 MED ORDER — AMLODIPINE BESYLATE 5 MG PO TABS
5.0000 mg | ORAL_TABLET | Freq: Every day | ORAL | 3 refills | Status: DC
  Filled 2022-07-29: qty 90, 90d supply, fill #0
  Filled 2023-01-20: qty 90, 90d supply, fill #1

## 2022-07-29 NOTE — Telephone Encounter (Signed)
Called pt to discuss BMP showing Cr increased from 0.85 to 1.42 to after starting Zestoretic.  Pt advised to stop this medication and will switch to Amlodipine and follow up in 2 weeks.

## 2022-07-30 NOTE — Assessment & Plan Note (Signed)
Increase metformin to 500 mg in the morning and 1000 mg in the evening for 1 week.  Further increase to 1000 mg twice daily if tolerating well.  Will increase Ozempic to 0.5 mg at the end of the month.  Patient advised to call and see if she can get an earlier ophthalmology appointment for diabetic eye exam.  Diabetic foot exam completed today.

## 2022-07-30 NOTE — Assessment & Plan Note (Signed)
BP controlled.  Continue current regimen of Zestoretic -BMP today

## 2022-08-12 ENCOUNTER — Ambulatory Visit: Payer: Commercial Managed Care - PPO | Admitting: Student

## 2022-08-22 ENCOUNTER — Other Ambulatory Visit: Payer: Self-pay | Admitting: Student

## 2022-08-22 DIAGNOSIS — E0865 Diabetes mellitus due to underlying condition with hyperglycemia: Secondary | ICD-10-CM

## 2022-08-24 ENCOUNTER — Telehealth: Payer: Self-pay | Admitting: Student

## 2022-08-24 ENCOUNTER — Other Ambulatory Visit (HOSPITAL_COMMUNITY): Payer: Self-pay

## 2022-08-24 ENCOUNTER — Other Ambulatory Visit (HOSPITAL_BASED_OUTPATIENT_CLINIC_OR_DEPARTMENT_OTHER): Payer: Self-pay

## 2022-08-24 DIAGNOSIS — H02831 Dermatochalasis of right upper eyelid: Secondary | ICD-10-CM | POA: Diagnosis not present

## 2022-08-24 DIAGNOSIS — H25013 Cortical age-related cataract, bilateral: Secondary | ICD-10-CM | POA: Diagnosis not present

## 2022-08-24 DIAGNOSIS — H401132 Primary open-angle glaucoma, bilateral, moderate stage: Secondary | ICD-10-CM | POA: Diagnosis not present

## 2022-08-24 DIAGNOSIS — H02834 Dermatochalasis of left upper eyelid: Secondary | ICD-10-CM | POA: Diagnosis not present

## 2022-08-24 DIAGNOSIS — H52203 Unspecified astigmatism, bilateral: Secondary | ICD-10-CM | POA: Diagnosis not present

## 2022-08-24 DIAGNOSIS — H35033 Hypertensive retinopathy, bilateral: Secondary | ICD-10-CM | POA: Diagnosis not present

## 2022-08-24 DIAGNOSIS — E113313 Type 2 diabetes mellitus with moderate nonproliferative diabetic retinopathy with macular edema, bilateral: Secondary | ICD-10-CM | POA: Diagnosis not present

## 2022-08-24 DIAGNOSIS — H2513 Age-related nuclear cataract, bilateral: Secondary | ICD-10-CM | POA: Diagnosis not present

## 2022-08-24 DIAGNOSIS — H35043 Retinal micro-aneurysms, unspecified, bilateral: Secondary | ICD-10-CM | POA: Diagnosis not present

## 2022-08-24 DIAGNOSIS — Z7984 Long term (current) use of oral hypoglycemic drugs: Secondary | ICD-10-CM | POA: Diagnosis not present

## 2022-08-24 MED ORDER — TIMOLOL MALEATE 0.5 % OP SOLN
1.0000 [drp] | Freq: Two times a day (BID) | OPHTHALMIC | 2 refills | Status: DC
  Filled 2022-08-24: qty 5, 25d supply, fill #0
  Filled 2022-08-24: qty 10, 38d supply, fill #0
  Filled 2022-09-01 (×2): qty 5, 25d supply, fill #1
  Filled 2022-09-14 (×2): qty 5, 25d supply, fill #2
  Filled 2022-10-15 – 2022-10-16 (×2): qty 5, 25d supply, fill #3

## 2022-08-24 MED ORDER — OZEMPIC (0.25 OR 0.5 MG/DOSE) 2 MG/3ML ~~LOC~~ SOPN
0.5000 mg | PEN_INJECTOR | SUBCUTANEOUS | 0 refills | Status: DC
  Filled 2022-08-24: qty 3, 28d supply, fill #0

## 2022-08-24 NOTE — Telephone Encounter (Signed)
Patient states has been doing well on Ozempic 0.25 mg would like to increase to 0.5 mg weekly which has been sent to the pharmacy.    After bump in creatinine, was taken off of Zestoretic and switch to amlodipine last month. Patient missed her last appointment for blood pressure check.  She does not check her blood pressure at home.  Appointment has been made for 4/10 for blood pressure check and BMP with Dr. Barbaraann Faster.  She is scheduled in June to return for Pap smear and A1c

## 2022-08-25 ENCOUNTER — Other Ambulatory Visit (HOSPITAL_BASED_OUTPATIENT_CLINIC_OR_DEPARTMENT_OTHER): Payer: Self-pay

## 2022-08-26 ENCOUNTER — Other Ambulatory Visit (HOSPITAL_COMMUNITY): Payer: Self-pay

## 2022-08-26 ENCOUNTER — Encounter: Payer: Self-pay | Admitting: Student

## 2022-08-26 ENCOUNTER — Other Ambulatory Visit: Payer: Self-pay

## 2022-08-26 ENCOUNTER — Ambulatory Visit: Payer: Commercial Managed Care - PPO | Admitting: Student

## 2022-08-26 VITALS — BP 167/82 | HR 80 | Ht 66.0 in | Wt 157.6 lb

## 2022-08-26 DIAGNOSIS — Z5309 Procedure and treatment not carried out because of other contraindication: Secondary | ICD-10-CM | POA: Diagnosis not present

## 2022-08-26 DIAGNOSIS — I10 Essential (primary) hypertension: Secondary | ICD-10-CM

## 2022-08-26 MED ORDER — HYDROCHLOROTHIAZIDE 12.5 MG PO TABS
12.5000 mg | ORAL_TABLET | Freq: Every day | ORAL | 2 refills | Status: DC
  Filled 2022-08-26: qty 30, 30d supply, fill #0
  Filled 2022-10-15: qty 30, 30d supply, fill #1

## 2022-08-26 NOTE — Assessment & Plan Note (Addendum)
Patient seen for BP f/u today. BP elevated to 163/82, patient reports compliance with amlodipine, taking nightly and tolerating well. No CP or SOB today. Was previously on lisinopril/HCTZ but was d/c for significant rise in Cr. (0.8 > 1.4), patient likely intolerant to ACE/ARBs. Will recheck BMP today. Will start HCTZ today w/ close f/u. Patient also w/ hx of poorly controlled DM, may benefit from SGLT2 for DM and BP management. -Continue amlodipine 5 mg -Start HCTZ 12.5 mg -BMP -F/u BP check 1 week -Consider SGLT2 in future

## 2022-08-26 NOTE — Progress Notes (Signed)
  SUBJECTIVE:   CHIEF COMPLAINT / HPI:   BP check BP today 163/82 BP Readings from Last 3 Encounters:  08/26/22 (!) 167/82  07/28/22 128/70  07/17/22 (!) 145/80  Meds: amlodipine 5 mg Was on HCTZ/Lisinopril combo pill previously, but had Cr bump and was d/c Tolerating amlodipine well, no CP or SOB today.    PERTINENT  PMH / PSH: HTN, DM  Patient Care Team: Erick Alley, DO as PCP - General (Family Medicine) OBJECTIVE:  BP (!) 167/82   Pulse 80   Ht 5\' 6"  (1.676 m)   Wt 157 lb 9.6 oz (71.5 kg)   LMP 11/25/2012   SpO2 100%   BMI 25.44 kg/m  Physical Exam Constitutional:      General: She is not in acute distress.    Appearance: Normal appearance. She is not ill-appearing.  Cardiovascular:     Rate and Rhythm: Normal rate and regular rhythm.     Pulses: Normal pulses.     Heart sounds: Normal heart sounds. No murmur heard.    No friction rub. No gallop.  Pulmonary:     Effort: Pulmonary effort is normal. No respiratory distress.     Breath sounds: Normal breath sounds. No stridor. No wheezing, rhonchi or rales.  Abdominal:     General: There is no distension.     Palpations: Abdomen is soft.     Tenderness: There is no abdominal tenderness. There is no guarding.  Neurological:     Mental Status: She is alert.  Psychiatric:        Mood and Affect: Mood normal.        Behavior: Behavior normal.      ASSESSMENT/PLAN:  Essential hypertension Assessment & Plan: Patient seen for BP f/u today. BP elevated to 163/82, patient reports compliance with amlodipine, taking nightly and tolerating well. No CP or SOB today. Was previously on lisinopril/HCTZ but was d/c for significant rise in Cr. (0.8 > 1.4), patient likely intolerant to ACE/ARBs. Will recheck BMP today. Will start HCTZ today w/ close f/u. Patient also w/ hx of poorly controlled DM, may benefit from SGLT2 for DM and BP management. -Continue amlodipine 5 mg -Start HCTZ 12.5 mg -BMP -F/u BP check 1  week -Consider SGLT2 in future  Orders: -     Basic metabolic panel  ACEI/ARB contraindicated Assessment & Plan: Avoiding ACEs/ARBs 2/2 to intolerance   Other orders -     hydroCHLOROthiazide; Take 1 tablet (12.5 mg total) by mouth daily.  Dispense: 30 tablet; Refill: 2   No follow-ups on file. Bess Kinds, MD 08/26/2022, 9:14 AM PGY-2, [facility]

## 2022-08-26 NOTE — Assessment & Plan Note (Signed)
Avoiding ACEs/ARBs 2/2 to intolerance

## 2022-08-26 NOTE — Patient Instructions (Signed)
It was great to see you! Thank you for allowing me to participate in your care!   Our plans for today:  - High Blood Pressure  Continue amlodipine 5 mg daily  Start HCTZ 12.5 mg daily  Make appointment for blood pressure check in 1 week  Checking BMP today to see kidney function   We are checking some labs today, I will call you if they are abnormal will send you a MyChart message or a letter if they are normal.  If you do not hear about your labs in the next 2 weeks please let us know.  Take care and seek immediate care sooner if you develop any concerns.   Dr. Bess Kinds, MD [facility]

## 2022-08-27 ENCOUNTER — Encounter: Payer: Self-pay | Admitting: Student

## 2022-08-27 LAB — BASIC METABOLIC PANEL
BUN/Creatinine Ratio: 25 — ABNORMAL HIGH (ref 9–23)
BUN: 23 mg/dL (ref 6–24)
CO2: 21 mmol/L (ref 20–29)
Calcium: 10 mg/dL (ref 8.7–10.2)
Chloride: 102 mmol/L (ref 96–106)
Creatinine, Ser: 0.92 mg/dL (ref 0.57–1.00)
Glucose: 157 mg/dL — ABNORMAL HIGH (ref 70–99)
Potassium: 4.8 mmol/L (ref 3.5–5.2)
Sodium: 140 mmol/L (ref 134–144)
eGFR: 72 mL/min/{1.73_m2} (ref >59)

## 2022-09-01 ENCOUNTER — Other Ambulatory Visit (HOSPITAL_COMMUNITY): Payer: Self-pay

## 2022-09-14 ENCOUNTER — Other Ambulatory Visit (HOSPITAL_COMMUNITY): Payer: Self-pay

## 2022-09-28 ENCOUNTER — Other Ambulatory Visit (HOSPITAL_COMMUNITY): Payer: Self-pay

## 2022-09-28 ENCOUNTER — Other Ambulatory Visit: Payer: Self-pay | Admitting: Student

## 2022-09-28 DIAGNOSIS — E0865 Diabetes mellitus due to underlying condition with hyperglycemia: Secondary | ICD-10-CM

## 2022-09-28 MED ORDER — OZEMPIC (0.25 OR 0.5 MG/DOSE) 2 MG/3ML ~~LOC~~ SOPN
0.5000 mg | PEN_INJECTOR | SUBCUTANEOUS | 0 refills | Status: DC
  Filled 2022-09-28: qty 3, 28d supply, fill #0

## 2022-10-01 DIAGNOSIS — H35043 Retinal micro-aneurysms, unspecified, bilateral: Secondary | ICD-10-CM | POA: Diagnosis not present

## 2022-10-01 DIAGNOSIS — H52203 Unspecified astigmatism, bilateral: Secondary | ICD-10-CM | POA: Diagnosis not present

## 2022-10-01 DIAGNOSIS — H25013 Cortical age-related cataract, bilateral: Secondary | ICD-10-CM | POA: Diagnosis not present

## 2022-10-01 DIAGNOSIS — H02834 Dermatochalasis of left upper eyelid: Secondary | ICD-10-CM | POA: Diagnosis not present

## 2022-10-01 DIAGNOSIS — H524 Presbyopia: Secondary | ICD-10-CM | POA: Diagnosis not present

## 2022-10-01 DIAGNOSIS — H2513 Age-related nuclear cataract, bilateral: Secondary | ICD-10-CM | POA: Diagnosis not present

## 2022-10-01 DIAGNOSIS — E113313 Type 2 diabetes mellitus with moderate nonproliferative diabetic retinopathy with macular edema, bilateral: Secondary | ICD-10-CM | POA: Diagnosis not present

## 2022-10-01 DIAGNOSIS — H02831 Dermatochalasis of right upper eyelid: Secondary | ICD-10-CM | POA: Diagnosis not present

## 2022-10-01 DIAGNOSIS — H401132 Primary open-angle glaucoma, bilateral, moderate stage: Secondary | ICD-10-CM | POA: Diagnosis not present

## 2022-10-01 DIAGNOSIS — H35033 Hypertensive retinopathy, bilateral: Secondary | ICD-10-CM | POA: Diagnosis not present

## 2022-10-16 ENCOUNTER — Other Ambulatory Visit (HOSPITAL_COMMUNITY): Payer: Self-pay

## 2022-10-19 ENCOUNTER — Encounter: Payer: Self-pay | Admitting: Student

## 2022-10-19 ENCOUNTER — Other Ambulatory Visit (HOSPITAL_COMMUNITY): Payer: Self-pay

## 2022-10-19 ENCOUNTER — Ambulatory Visit: Payer: Commercial Managed Care - PPO | Admitting: Student

## 2022-10-19 VITALS — BP 132/66 | HR 75 | Ht 66.0 in | Wt 157.0 lb

## 2022-10-19 DIAGNOSIS — G629 Polyneuropathy, unspecified: Secondary | ICD-10-CM | POA: Diagnosis not present

## 2022-10-19 DIAGNOSIS — E119 Type 2 diabetes mellitus without complications: Secondary | ICD-10-CM

## 2022-10-19 DIAGNOSIS — I10 Essential (primary) hypertension: Secondary | ICD-10-CM

## 2022-10-19 DIAGNOSIS — E1140 Type 2 diabetes mellitus with diabetic neuropathy, unspecified: Secondary | ICD-10-CM

## 2022-10-19 DIAGNOSIS — Z1231 Encounter for screening mammogram for malignant neoplasm of breast: Secondary | ICD-10-CM | POA: Diagnosis not present

## 2022-10-19 LAB — POCT GLYCOSYLATED HEMOGLOBIN (HGB A1C): Hemoglobin A1C: 7.6 % — AB (ref 4.0–5.6)

## 2022-10-19 MED ORDER — SEMAGLUTIDE (1 MG/DOSE) 4 MG/3ML ~~LOC~~ SOPN
1.0000 mg | PEN_INJECTOR | SUBCUTANEOUS | 1 refills | Status: DC
  Filled 2022-10-19: qty 3, 28d supply, fill #0
  Filled 2022-11-24: qty 3, 28d supply, fill #1

## 2022-10-19 MED ORDER — GABAPENTIN 100 MG PO CAPS
100.0000 mg | ORAL_CAPSULE | Freq: Every day | ORAL | 0 refills | Status: DC
  Filled 2022-10-19: qty 90, 90d supply, fill #0

## 2022-10-19 MED ORDER — EMPAGLIFLOZIN 10 MG PO TABS
10.0000 mg | ORAL_TABLET | Freq: Every day | ORAL | 0 refills | Status: DC
  Filled 2022-10-19: qty 90, 90d supply, fill #0

## 2022-10-19 NOTE — Patient Instructions (Addendum)
It was great to see you! Thank you for allowing me to participate in your care!  I recommend that you always bring your medications to each appointment as this makes it easy to ensure you are on the correct medications and helps [place] not miss when refills are needed.  Our plans for today:  - I prescribed and increased dose of Ozempic - 1 mg weekly  - I prescribed a new medication for diabetes called Jardiance. Take this once daily. Possible side effects include vaginal yeast infections and urinary tract infections. If you have symptoms of these, stop the medication and make an appointment. If you become acutely sick, stop taking the Jardiance until you are feeling better.   - stop taking metformin to see diarrhea improves   - I prescribed a medication for nerve pain called Gabapentin. Take 100 mg at bedtime   -Return in 2-4 weeks for a follow up visit and pap smear   -Return in 3 months for A1c  -I ordered a mammogram, call the breast center to schedule an appointment   We are checking some labs today, I will call you if they are abnormal will send you a MyChart message or a letter if they are normal.  If you do not hear about your labs in the next 2 weeks please let us know.  Take care and seek immediate care sooner if you develop any concerns.   Dr. Erick Alley, DO [facility]

## 2022-10-19 NOTE — Progress Notes (Unsigned)
    SUBJECTIVE:   CHIEF COMPLAINT / HPI:   T2DM Microalbumin Cr ratio elevated to 180 earlier this year.  Currently taking Ozempic 0.5 mg weekly and metformin 500 mg, 1 tab in morning and 2 tabs in evening but states the metformin is causing diarrhea and she would like to reduce the dose/come off it.   HTN Last seen on 08/26/22 w/ elevated BP reading on Amlodipine 5mg . HCTZ 12.5 mg was added to regimen which she states she has been taking.   Neuropathy  Tingling and burning in bilateral hands and feet for past 2 weeks. Worse when trying to go bed, can't sleep at night.    PERTINENT  PMH / PSH: HTN, T2DM   OBJECTIVE:   BP 132/66   Pulse 75   Ht 5\' 6"  (1.676 m)   Wt 157 lb (71.2 kg)   LMP 11/25/2012   SpO2 99%   BMI 25.34 kg/m    General: NAD, pleasant, able to participate in exam Cardiac: RRR Respiratory: CTAB, normal effort, No wheezes, rales or rhonchi Extremities: no edema or cyanosis, neurovascularly intact in BUEs and BLEs  Skin: warm and dry  Neuro: alert, no obvious focal deficits Psych: Normal affect and mood  ASSESSMENT/PLAN:   Essential hypertension Well controlled.  -Continue current regimen including amlodipine and HCTZ  -BMP today   T2DM (type 2 diabetes mellitus) (HCC) HgbA1c 12.8>7.6 in last 3 months.  -d/c metformin per pt request as it is causing diarrhea.  -Increase Ozempic to 1 mg weekly  -Start Jardiance 10 mg weekly -Recheck A1c in 3 months   Neuropathy Likely d/t rapid decrease in A1c over last 3 months although metformin and ozempic were started at lowest doses and titrated up appropriately.  -gabapentin 100 mg nightly, will titrate up as needed   Health maintenance  Mammogram ordered Return in 2-4 weeks for f/u and pap smear  Dr. Erick Alley, DO [facility]

## 2022-10-20 ENCOUNTER — Other Ambulatory Visit (HOSPITAL_BASED_OUTPATIENT_CLINIC_OR_DEPARTMENT_OTHER): Payer: Self-pay

## 2022-10-20 DIAGNOSIS — G629 Polyneuropathy, unspecified: Secondary | ICD-10-CM | POA: Insufficient documentation

## 2022-10-20 LAB — BASIC METABOLIC PANEL
BUN/Creatinine Ratio: 27 — ABNORMAL HIGH (ref 9–23)
BUN: 24 mg/dL (ref 6–24)
CO2: 23 mmol/L (ref 20–29)
Calcium: 9.7 mg/dL (ref 8.7–10.2)
Chloride: 104 mmol/L (ref 96–106)
Creatinine, Ser: 0.89 mg/dL (ref 0.57–1.00)
Glucose: 170 mg/dL — ABNORMAL HIGH (ref 70–99)
Potassium: 5.1 mmol/L (ref 3.5–5.2)
Sodium: 139 mmol/L (ref 134–144)
eGFR: 75 mL/min/{1.73_m2} (ref >59)

## 2022-10-20 NOTE — Assessment & Plan Note (Signed)
Likely d/t rapid decrease in A1c over last 3 months although metformin and ozempic were started at lowest doses and titrated up appropriately.  -gabapentin 100 mg nightly, will titrate up as needed

## 2022-10-20 NOTE — Assessment & Plan Note (Signed)
HgbA1c 12.8>7.6 in last 3 months.  -d/c metformin per pt request as it is causing diarrhea.  -Increase Ozempic to 1 mg weekly  -Start Jardiance 10 mg weekly -Recheck A1c in 3 months

## 2022-10-20 NOTE — Assessment & Plan Note (Signed)
Well controlled.  -Continue current regimen including amlodipine and HCTZ  -BMP today

## 2022-10-21 ENCOUNTER — Ambulatory Visit: Payer: Commercial Managed Care - PPO | Admitting: Student

## 2022-10-21 ENCOUNTER — Other Ambulatory Visit (HOSPITAL_COMMUNITY): Payer: Self-pay

## 2022-10-28 ENCOUNTER — Other Ambulatory Visit (HOSPITAL_COMMUNITY): Payer: Self-pay

## 2022-10-28 ENCOUNTER — Other Ambulatory Visit (HOSPITAL_BASED_OUTPATIENT_CLINIC_OR_DEPARTMENT_OTHER): Payer: Self-pay

## 2022-10-28 DIAGNOSIS — E113313 Type 2 diabetes mellitus with moderate nonproliferative diabetic retinopathy with macular edema, bilateral: Secondary | ICD-10-CM | POA: Diagnosis not present

## 2022-10-28 DIAGNOSIS — H401132 Primary open-angle glaucoma, bilateral, moderate stage: Secondary | ICD-10-CM | POA: Diagnosis not present

## 2022-10-28 DIAGNOSIS — H52203 Unspecified astigmatism, bilateral: Secondary | ICD-10-CM | POA: Diagnosis not present

## 2022-10-28 DIAGNOSIS — H524 Presbyopia: Secondary | ICD-10-CM | POA: Diagnosis not present

## 2022-10-28 MED ORDER — DORZOLAMIDE HCL-TIMOLOL MAL 2-0.5 % OP SOLN
1.0000 [drp] | Freq: Two times a day (BID) | OPHTHALMIC | 2 refills | Status: DC
  Filled 2022-10-28: qty 10, 90d supply, fill #0
  Filled 2022-10-28: qty 10, 50d supply, fill #0
  Filled 2022-11-24 – 2022-12-10 (×3): qty 10, 50d supply, fill #1

## 2022-11-03 ENCOUNTER — Ambulatory Visit: Payer: Commercial Managed Care - PPO | Admitting: Student

## 2022-11-10 ENCOUNTER — Telehealth: Payer: Self-pay | Admitting: Student

## 2022-11-10 NOTE — Telephone Encounter (Signed)
Called to check on pt to see how her neuropathy is doing on low dose gabapentin. She did not answer. Left VM and advised she call the office to schedule an appointment in near future for follow up and pap smear

## 2022-11-20 ENCOUNTER — Encounter: Payer: Self-pay | Admitting: Gastroenterology

## 2022-11-24 ENCOUNTER — Other Ambulatory Visit: Payer: Self-pay

## 2022-11-24 ENCOUNTER — Other Ambulatory Visit (HOSPITAL_COMMUNITY): Payer: Self-pay

## 2022-11-27 ENCOUNTER — Other Ambulatory Visit (HOSPITAL_COMMUNITY): Payer: Self-pay

## 2022-12-10 ENCOUNTER — Other Ambulatory Visit (HOSPITAL_COMMUNITY)
Admission: RE | Admit: 2022-12-10 | Discharge: 2022-12-10 | Disposition: A | Discharge status: Home / Self Care | Payer: Commercial Managed Care - PPO | Source: Ambulatory Visit | Attending: Family Medicine | Admitting: Family Medicine

## 2022-12-10 ENCOUNTER — Telehealth: Payer: Self-pay

## 2022-12-10 ENCOUNTER — Encounter: Payer: Self-pay | Admitting: Student

## 2022-12-10 ENCOUNTER — Other Ambulatory Visit (HOSPITAL_COMMUNITY): Payer: Self-pay

## 2022-12-10 ENCOUNTER — Ambulatory Visit: Payer: Commercial Managed Care - PPO | Admitting: Student

## 2022-12-10 ENCOUNTER — Other Ambulatory Visit: Payer: Self-pay

## 2022-12-10 VITALS — BP 166/77 | HR 79 | Ht 66.0 in | Wt 157.2 lb

## 2022-12-10 DIAGNOSIS — E119 Type 2 diabetes mellitus without complications: Secondary | ICD-10-CM | POA: Diagnosis not present

## 2022-12-10 DIAGNOSIS — I10 Essential (primary) hypertension: Secondary | ICD-10-CM | POA: Diagnosis not present

## 2022-12-10 DIAGNOSIS — Z124 Encounter for screening for malignant neoplasm of cervix: Secondary | ICD-10-CM | POA: Insufficient documentation

## 2022-12-10 DIAGNOSIS — G629 Polyneuropathy, unspecified: Secondary | ICD-10-CM

## 2022-12-10 DIAGNOSIS — R011 Cardiac murmur, unspecified: Secondary | ICD-10-CM | POA: Diagnosis not present

## 2022-12-10 MED ORDER — GABAPENTIN 100 MG PO CAPS
100.0000 mg | ORAL_CAPSULE | Freq: Three times a day (TID) | ORAL | 3 refills | Status: DC
  Filled 2022-12-10: qty 90, 30d supply, fill #0

## 2022-12-10 MED ORDER — HYDROCHLOROTHIAZIDE 25 MG PO TABS
25.0000 mg | ORAL_TABLET | Freq: Every day | ORAL | 3 refills | Status: DC
  Filled 2022-12-10: qty 90, 90d supply, fill #0
  Filled 2023-01-20: qty 90, 90d supply, fill #1

## 2022-12-10 MED ORDER — EMPAGLIFLOZIN 10 MG PO TABS
10.0000 mg | ORAL_TABLET | Freq: Every day | ORAL | 0 refills | Status: DC
  Filled 2022-12-10: qty 90, 90d supply, fill #0

## 2022-12-10 MED ORDER — SEMAGLUTIDE (1 MG/DOSE) 4 MG/3ML ~~LOC~~ SOPN
1.0000 mg | PEN_INJECTOR | SUBCUTANEOUS | 2 refills | Status: DC
  Filled 2022-12-10 – 2023-01-20 (×3): qty 3, 28d supply, fill #0
  Filled 2023-03-04: qty 3, 28d supply, fill #1

## 2022-12-10 NOTE — Telephone Encounter (Signed)
ECHO does not need pre auth for the following CPT codes.  [id number], [id number], [id number], [id number], [id number], [id number] and [id number].  Sunday Spillers

## 2022-12-10 NOTE — Progress Notes (Signed)
    SUBJECTIVE:   CHIEF COMPLAINT / HPI:   HTN Currently taking amlodipine 5 mg and hydrochlorothiazide 12.5 mg  T2DM Takes metoformin every now and then although we discussed stopping it completely at last visit due to GI upset. Is not taking the Jardiance prescribed at last visit, does not like taking so many pills. She is taking Ozempic 1 mg injected weekly. A1c 7.6 on 10/19/22   Neuropathy Was prescribed gabapentin 100 mg at bedtime at last visit with plans to titrate up as needed however patient is not taking it.  Complains of severe burning pain in both feet, keeps her up at night, makes it difficult for her to walk throughout the day.   PERTINENT  PMH / PSH: HTN, T2DM, neuropathy  OBJECTIVE:   BP (!) 166/77   Pulse 79   Ht 5\' 6"  (1.676 m)   Wt 157 lb 3.2 oz (71.3 kg)   LMP 11/25/2012   SpO2 100%   BMI 25.37 kg/m    General: NAD, pleasant, able to participate in exam Cardiac: 1/6 systolic murmur, RRR Respiratory: Breathing comfortably on room air Extremities: no edema or cyanosis. GU: Chaperoned by CMA.  Normal external female genitalia with no lesions.  Moist pink vaginal mucosa, normal-appearing cervix with no masses or lesions Skin: warm and dry, no rashes noted Neuro: alert, no obvious focal deficits Psych: Normal affect and mood  ASSESSMENT/PLAN:   Neuropathy Restart gabapentin, increase to 100 mg 3 times daily.  Discussed with patient if she tolerates this well and still needs better control, can further increase to 200 mg 3 times daily. -Will follow-up on neuropathy at next visit when she returns for A1c in early September   T2DM (type 2 diabetes mellitus) (HCC) -Stop taking metformin as we discussed at last visit -Start taking Jardiance 10 mg daily which was refilled -Continue Ozempic 1 mg injected weekly which was refilled -Will plan to recheck A1c in ~3 months -On chart review, is up-to-date on DM eye exam (ophthalmology notes in care  everywhere)  Essential hypertension Uncontrolled -increase HCTZ to 25 mg -Continue amlodipine 5 mg daily -Patient to return in 2 weeks for BP follow-up and bring medications  Systolic murmur Patient was unaware of systolic murmur.  On chart review this was noted for the first time in 2021 and an echocardiogram was ordered but never completed. -Echocardiogram ordered   Health maintenance Pap smear done today  States she will call to set up colonoscopy, has a number  Dr. Erick Alley, DO [facility]

## 2022-12-10 NOTE — Assessment & Plan Note (Signed)
Patient was unaware of systolic murmur.  On chart review this was noted for the first time in 2021 and an echocardiogram was ordered but never completed. -Echocardiogram ordered

## 2022-12-10 NOTE — Patient Instructions (Addendum)
It was great to see you! Thank you for allowing me to participate in your care!  I recommend that you always bring your medications to each appointment as this makes it easy to ensure you are on the correct medications and helps [place] not miss when refills are needed.  Our plans for today:  -I sent in a prescription for gabapentin for foot pain.  Take 100 mg (1 tablet) 3 times a day.  If you tolerate this well but still have pain, can increase to 200 mg 3 times daily. -I sent in refills for your Ozempic and Jardiance for diabetes. -Do not take metformin -You will be called to schedule your echocardiogram (heart ultrasound)  -Return in 2 weeks for blood pressure check. Bring medications with you.  -Return in early September to check A1c  Take care and seek immediate care sooner if you develop any concerns.   Dr. Erick Alley, DO [facility]

## 2022-12-10 NOTE — Assessment & Plan Note (Addendum)
-  Stop taking metformin as we discussed at last visit -Start taking Jardiance 10 mg daily which was refilled -Continue Ozempic 1 mg injected weekly which was refilled -Will plan to recheck A1c in ~3 months -On chart review, is up-to-date on DM eye exam (ophthalmology notes in care everywhere)

## 2022-12-10 NOTE — Assessment & Plan Note (Addendum)
Restart gabapentin, increase to 100 mg 3 times daily.  Discussed with patient if she tolerates this well and still needs better control, can further increase to 200 mg 3 times daily. -Will follow-up on neuropathy at next visit when she returns for A1c in early September

## 2022-12-10 NOTE — Assessment & Plan Note (Signed)
Uncontrolled -increase HCTZ to 25 mg -Continue amlodipine 5 mg daily -Patient to return in 2 weeks for BP follow-up and bring medications

## 2022-12-14 ENCOUNTER — Ambulatory Visit
Admission: RE | Admit: 2022-12-14 | Discharge: 2022-12-14 | Disposition: A | Discharge status: Home / Self Care | Payer: Commercial Managed Care - PPO | Source: Ambulatory Visit | Attending: Family Medicine | Admitting: Family Medicine

## 2022-12-14 ENCOUNTER — Encounter: Payer: Self-pay | Admitting: Student

## 2022-12-14 DIAGNOSIS — Z1231 Encounter for screening mammogram for malignant neoplasm of breast: Secondary | ICD-10-CM | POA: Diagnosis not present

## 2022-12-17 ENCOUNTER — Other Ambulatory Visit (HOSPITAL_COMMUNITY): Payer: Self-pay

## 2022-12-29 ENCOUNTER — Ambulatory Visit (HOSPITAL_COMMUNITY): Admission: RE | Admit: 2022-12-29 | Payer: Commercial Managed Care - PPO | Source: Ambulatory Visit

## 2022-12-29 DIAGNOSIS — I3481 Nonrheumatic mitral (valve) annulus calcification: Secondary | ICD-10-CM | POA: Diagnosis not present

## 2022-12-29 DIAGNOSIS — I10 Essential (primary) hypertension: Secondary | ICD-10-CM | POA: Insufficient documentation

## 2022-12-29 DIAGNOSIS — E119 Type 2 diabetes mellitus without complications: Secondary | ICD-10-CM | POA: Diagnosis not present

## 2022-12-29 DIAGNOSIS — I5030 Unspecified diastolic (congestive) heart failure: Secondary | ICD-10-CM

## 2022-12-29 DIAGNOSIS — R011 Cardiac murmur, unspecified: Secondary | ICD-10-CM | POA: Insufficient documentation

## 2022-12-29 LAB — ECHOCARDIOGRAM COMPLETE
Area-P 1/2: 3.72 cm2
Calc EF: 60.6 %
S' Lateral: 2.2 cm
Single Plane A2C EF: 57.6 %
Single Plane A4C EF: 63.2 %

## 2022-12-29 NOTE — Progress Notes (Signed)
Echocardiogram 2D Echocardiogram has been performed.  Sally Mclaughlin 12/29/2022, 9:49 AM

## 2022-12-30 ENCOUNTER — Ambulatory Visit: Payer: Self-pay | Admitting: Student

## 2023-01-01 ENCOUNTER — Encounter: Payer: Self-pay | Admitting: Student

## 2023-01-19 ENCOUNTER — Other Ambulatory Visit (HOSPITAL_BASED_OUTPATIENT_CLINIC_OR_DEPARTMENT_OTHER): Payer: Self-pay

## 2023-01-19 DIAGNOSIS — E113313 Type 2 diabetes mellitus with moderate nonproliferative diabetic retinopathy with macular edema, bilateral: Secondary | ICD-10-CM | POA: Diagnosis not present

## 2023-01-19 DIAGNOSIS — H401132 Primary open-angle glaucoma, bilateral, moderate stage: Secondary | ICD-10-CM | POA: Diagnosis not present

## 2023-01-19 DIAGNOSIS — H35353 Cystoid macular degeneration, bilateral: Secondary | ICD-10-CM | POA: Diagnosis not present

## 2023-01-19 MED ORDER — DORZOLAMIDE HCL-TIMOLOL MAL 2-0.5 % OP SOLN
1.0000 [drp] | Freq: Two times a day (BID) | OPHTHALMIC | 6 refills | Status: DC
  Filled 2023-01-19: qty 10, 90d supply, fill #0
  Filled 2023-01-20: qty 10, 50d supply, fill #0
  Filled 2023-03-04: qty 10, 50d supply, fill #1

## 2023-01-20 ENCOUNTER — Other Ambulatory Visit (HOSPITAL_COMMUNITY): Payer: Self-pay

## 2023-01-21 ENCOUNTER — Other Ambulatory Visit (HOSPITAL_BASED_OUTPATIENT_CLINIC_OR_DEPARTMENT_OTHER): Payer: Self-pay

## 2023-02-15 ENCOUNTER — Other Ambulatory Visit (HOSPITAL_COMMUNITY): Payer: Self-pay

## 2023-06-08 DIAGNOSIS — H401132 Primary open-angle glaucoma, bilateral, moderate stage: Secondary | ICD-10-CM | POA: Diagnosis not present

## 2023-11-11 ENCOUNTER — Other Ambulatory Visit: Payer: Self-pay

## 2023-11-11 ENCOUNTER — Ambulatory Visit

## 2023-11-11 ENCOUNTER — Ambulatory Visit
Admission: EM | Admit: 2023-11-11 | Discharge: 2023-11-11 | Disposition: A | Discharge status: Home / Self Care | Attending: Family Medicine | Admitting: Family Medicine

## 2023-11-11 ENCOUNTER — Encounter (HOSPITAL_COMMUNITY): Payer: Self-pay

## 2023-11-11 ENCOUNTER — Inpatient Hospital Stay (HOSPITAL_COMMUNITY)
Admission: EM | Admit: 2023-11-11 | Discharge: 2023-11-18 | DRG: 629 | Disposition: A | Discharge status: Home / Self Care | Source: Ambulatory Visit | Attending: Family Medicine | Admitting: Family Medicine

## 2023-11-11 DIAGNOSIS — M79644 Pain in right finger(s): Secondary | ICD-10-CM

## 2023-11-11 DIAGNOSIS — R011 Cardiac murmur, unspecified: Secondary | ICD-10-CM | POA: Diagnosis present

## 2023-11-11 DIAGNOSIS — E1169 Type 2 diabetes mellitus with other specified complication: Principal | ICD-10-CM | POA: Diagnosis present

## 2023-11-11 DIAGNOSIS — R7989 Other specified abnormal findings of blood chemistry: Secondary | ICD-10-CM

## 2023-11-11 DIAGNOSIS — B3789 Other sites of candidiasis: Secondary | ICD-10-CM | POA: Diagnosis present

## 2023-11-11 DIAGNOSIS — A499 Bacterial infection, unspecified: Secondary | ICD-10-CM

## 2023-11-11 DIAGNOSIS — L089 Local infection of the skin and subcutaneous tissue, unspecified: Secondary | ICD-10-CM

## 2023-11-11 DIAGNOSIS — L03011 Cellulitis of right finger: Secondary | ICD-10-CM | POA: Diagnosis not present

## 2023-11-11 DIAGNOSIS — E1165 Type 2 diabetes mellitus with hyperglycemia: Secondary | ICD-10-CM | POA: Diagnosis present

## 2023-11-11 DIAGNOSIS — B353 Tinea pedis: Secondary | ICD-10-CM | POA: Diagnosis present

## 2023-11-11 DIAGNOSIS — M869 Osteomyelitis, unspecified: Secondary | ICD-10-CM | POA: Diagnosis present

## 2023-11-11 DIAGNOSIS — B957 Other staphylococcus as the cause of diseases classified elsewhere: Secondary | ICD-10-CM | POA: Diagnosis present

## 2023-11-11 DIAGNOSIS — T461X6A Underdosing of calcium-channel blockers, initial encounter: Secondary | ICD-10-CM | POA: Diagnosis present

## 2023-11-11 DIAGNOSIS — Z91148 Patient's other noncompliance with medication regimen for other reason: Secondary | ICD-10-CM

## 2023-11-11 DIAGNOSIS — E1140 Type 2 diabetes mellitus with diabetic neuropathy, unspecified: Secondary | ICD-10-CM | POA: Diagnosis present

## 2023-11-11 DIAGNOSIS — R739 Hyperglycemia, unspecified: Secondary | ICD-10-CM

## 2023-11-11 DIAGNOSIS — F17210 Nicotine dependence, cigarettes, uncomplicated: Secondary | ICD-10-CM | POA: Diagnosis present

## 2023-11-11 DIAGNOSIS — Z789 Other specified health status: Secondary | ICD-10-CM

## 2023-11-11 DIAGNOSIS — I10 Essential (primary) hypertension: Secondary | ICD-10-CM | POA: Diagnosis present

## 2023-11-11 DIAGNOSIS — Z79899 Other long term (current) drug therapy: Secondary | ICD-10-CM

## 2023-11-11 DIAGNOSIS — E872 Acidosis, unspecified: Secondary | ICD-10-CM | POA: Diagnosis present

## 2023-11-11 DIAGNOSIS — B951 Streptococcus, group B, as the cause of diseases classified elsewhere: Secondary | ICD-10-CM | POA: Diagnosis present

## 2023-11-11 DIAGNOSIS — E7800 Pure hypercholesterolemia, unspecified: Secondary | ICD-10-CM | POA: Diagnosis present

## 2023-11-11 DIAGNOSIS — Z7985 Long-term (current) use of injectable non-insulin antidiabetic drugs: Secondary | ICD-10-CM

## 2023-11-11 DIAGNOSIS — E119 Type 2 diabetes mellitus without complications: Secondary | ICD-10-CM

## 2023-11-11 DIAGNOSIS — Z7984 Long term (current) use of oral hypoglycemic drugs: Secondary | ICD-10-CM

## 2023-11-11 DIAGNOSIS — B9561 Methicillin susceptible Staphylococcus aureus infection as the cause of diseases classified elsewhere: Secondary | ICD-10-CM | POA: Diagnosis present

## 2023-11-11 NOTE — ED Provider Notes (Signed)
 [name]  Note:  This document was prepared using Conservation officer, historic buildings and may include unintentional dictation errors.  MRN: [id number] DOB: 06-07-1965  Subjective:   Sally Mclaughlin is a 58 y.o. female presenting for approximately 2 week history of right index finger swelling, discoloration, weeping. Patient is a diabetic and does not have sensation in her finger.  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  But she cannot recall any specific injury.  No fever.  No chest pain, nausea, vomiting.  Patient has tried to keep it covered.  No current facility-administered medications for this encounter.  Current Outpatient Medications:    amLODipine  (NORVASC ) 5 MG tablet, Take 1 tablet (5 mg total) by mouth at bedtime., Disp: 90 tablet, Rfl: 3   atorvastatin  (LIPITOR) 80 MG tablet, Take 1 tablet (80 mg total) by mouth daily., Disp: 90 tablet, Rfl: 3   dorzolamide -timolol  (COSOPT ) 2-0.5 % ophthalmic solution, Place 1 drop into both eyes 2 (two) times daily., Disp: 10 mL, Rfl: 2   dorzolamide -timolol  (COSOPT ) 2-0.5 % ophthalmic solution, Place 1 drop into both eyes 2 (two) times daily., Disp: 10 mL, Rfl: 6   empagliflozin  (JARDIANCE ) 10 MG TABS tablet, Take 1 tablet (10 mg total) by mouth daily., Disp: 90 tablet, Rfl: 0   fluconazole  (DIFLUCAN ) 150 MG tablet, Take 1 tablet (150 mg total) by mouth now. If symptoms persist in 3 days, take the second tablet., Disp: 2 tablet, Rfl: 0   gabapentin  (NEURONTIN ) 100 MG capsule, Take 1 capsule (100 mg total) by mouth 3 (three) times daily. If needed, can increase to 200 mg 3 times daily, Disp: 90 capsule, Rfl: 3   hydrochlorothiazide  (HYDRODIURIL ) 25 MG tablet, Take 1 tablet (25 mg total) by mouth daily., Disp: 90 tablet, Rfl: 3   Semaglutide , 1 MG/DOSE, 4 MG/3ML SOPN, Inject 1 mg into the skin once a week., Disp: 3 mL, Rfl: 2   timolol  (TIMOPTIC ) 0.5 % ophthalmic solution, Place  1 drop into both eyes 2 (two) times daily., Disp: 10 mL, Rfl: 2   No Known Allergies  Past Medical History:  Diagnosis Date   Diabetes mellitus without complication (HCC)    Hypercholesteremia    Hypertension    Suprapubic abscess 09/06/2019     Past Surgical History:  Procedure Laterality Date   HERNIA REPAIR      Family History  Problem Relation Age of Onset   Ovarian cancer Sister    Colon cancer Neg Hx    Colon polyps Neg Hx    Esophageal cancer Neg Hx    Stomach cancer Neg Hx    Rectal cancer Neg Hx     Social History   Tobacco Use   Smoking status: Light Smoker    Types: Cigarettes   Smokeless tobacco: Never  Vaping Use   Vaping status: Never Used  Substance Use Topics   Alcohol use: Yes    Comment: social   Drug use: No    ROS   Objective:   Vitals: BP (!) 191/99 (BP Location: Right Arm)   Pulse 68   Temp 98.2 F (36.8 C) (Oral)   Resp 18   LMP 11/25/2012   SpO2 97%   Physical Exam Constitutional:      General: She is not in acute distress.    Appearance: Normal appearance. She is well-developed. She is not ill-appearing, toxic-appearing or diaphoretic.  HENT:     Head: Normocephalic and  atraumatic.     Nose: Nose normal.     Mouth/Throat:     Mouth: Mucous membranes are moist.   Eyes:     General: No scleral icterus.       Right eye: No discharge.        Left eye: No discharge.     Extraocular Movements: Extraocular movements intact.    Cardiovascular:     Rate and Rhythm: Normal rate.  Pulmonary:     Effort: Pulmonary effort is normal.   Musculoskeletal:       Hands:   Skin:    General: Skin is warm and dry.   Neurological:     General: No focal deficit present.     Mental Status: She is alert and oriented to person, place, and time.   Psychiatric:        Mood and Affect: Mood normal.        Behavior: Behavior normal.              Assessment and Plan :   PDMP not reviewed this encounter.  1. Finger  pain, right   2. Finger infection    Patient is in need of a higher level of care than we can provide in the urgent care setting.  This includes rule out of osteomyelitis, felon, deep space infection of the right index finger.  I discussed this with the patient and her grandson.  Advised that she present to the emergency room immediately.   Sally Mclaughlin, [place] 11/11/23 1729

## 2023-11-11 NOTE — Discharge Instructions (Signed)
 Please go to the emergency room now. I am concerned that you are in need of a higher level of care than we can provide in the urgent care setting. This includes deep space infection, osteomyelitis of the right index finger. Go straight to the emergency room now.

## 2023-11-11 NOTE — ED Notes (Signed)
 Patient is being discharged from the Urgent Care and sent to the Emergency Department via private vehicle . Per [facility]. [facility], patient is in need of higher level of care due to infected finger R/O osteo . Patient is aware and verbalizes understanding of plan of care.  Vitals:   11/11/23 1701  BP: (!) 191/99  Pulse: 68  Resp: 18  Temp: 98.2 F (36.8 C)  SpO2: 97%

## 2023-11-11 NOTE — ED Triage Notes (Signed)
 Pt reports with redness and swelling in the right index finger. Pt denies pain. Pt states she is here today as her grandson brought here today.

## 2023-11-11 NOTE — ED Triage Notes (Signed)
 Pt here for right pointer finger pain. Pt states no trauma to finger. Pt states pus is coming out of finger/ swollen. Finger is currently wrapped up.

## 2023-11-12 ENCOUNTER — Inpatient Hospital Stay (HOSPITAL_COMMUNITY): Admitting: Anesthesiology

## 2023-11-12 ENCOUNTER — Other Ambulatory Visit: Payer: Self-pay

## 2023-11-12 ENCOUNTER — Observation Stay (HOSPITAL_COMMUNITY)

## 2023-11-12 ENCOUNTER — Encounter (HOSPITAL_COMMUNITY)
Admission: EM | Disposition: A | Discharge status: Home / Self Care | Payer: Self-pay | Source: Ambulatory Visit | Attending: Family Medicine

## 2023-11-12 ENCOUNTER — Encounter (HOSPITAL_COMMUNITY): Payer: Self-pay | Admitting: Family Medicine

## 2023-11-12 ENCOUNTER — Emergency Department (HOSPITAL_COMMUNITY)

## 2023-11-12 DIAGNOSIS — M869 Osteomyelitis, unspecified: Secondary | ICD-10-CM | POA: Diagnosis not present

## 2023-11-12 DIAGNOSIS — Z7984 Long term (current) use of oral hypoglycemic drugs: Secondary | ICD-10-CM | POA: Diagnosis not present

## 2023-11-12 DIAGNOSIS — B353 Tinea pedis: Secondary | ICD-10-CM | POA: Diagnosis not present

## 2023-11-12 DIAGNOSIS — B3789 Other sites of candidiasis: Secondary | ICD-10-CM | POA: Diagnosis not present

## 2023-11-12 DIAGNOSIS — R739 Hyperglycemia, unspecified: Secondary | ICD-10-CM | POA: Diagnosis not present

## 2023-11-12 DIAGNOSIS — R011 Cardiac murmur, unspecified: Secondary | ICD-10-CM | POA: Diagnosis present

## 2023-11-12 DIAGNOSIS — E1169 Type 2 diabetes mellitus with other specified complication: Secondary | ICD-10-CM | POA: Diagnosis not present

## 2023-11-12 DIAGNOSIS — R7989 Other specified abnormal findings of blood chemistry: Secondary | ICD-10-CM

## 2023-11-12 DIAGNOSIS — E1140 Type 2 diabetes mellitus with diabetic neuropathy, unspecified: Secondary | ICD-10-CM | POA: Diagnosis not present

## 2023-11-12 DIAGNOSIS — B951 Streptococcus, group B, as the cause of diseases classified elsewhere: Secondary | ICD-10-CM | POA: Diagnosis not present

## 2023-11-12 DIAGNOSIS — F17210 Nicotine dependence, cigarettes, uncomplicated: Secondary | ICD-10-CM | POA: Diagnosis present

## 2023-11-12 DIAGNOSIS — E872 Acidosis, unspecified: Secondary | ICD-10-CM | POA: Diagnosis not present

## 2023-11-12 DIAGNOSIS — B9561 Methicillin susceptible Staphylococcus aureus infection as the cause of diseases classified elsewhere: Secondary | ICD-10-CM | POA: Diagnosis not present

## 2023-11-12 DIAGNOSIS — I10 Essential (primary) hypertension: Secondary | ICD-10-CM | POA: Diagnosis not present

## 2023-11-12 DIAGNOSIS — L089 Local infection of the skin and subcutaneous tissue, unspecified: Secondary | ICD-10-CM | POA: Diagnosis not present

## 2023-11-12 DIAGNOSIS — Z79899 Other long term (current) drug therapy: Secondary | ICD-10-CM | POA: Diagnosis not present

## 2023-11-12 DIAGNOSIS — B957 Other staphylococcus as the cause of diseases classified elsewhere: Secondary | ICD-10-CM | POA: Diagnosis present

## 2023-11-12 DIAGNOSIS — L03011 Cellulitis of right finger: Secondary | ICD-10-CM | POA: Diagnosis not present

## 2023-11-12 DIAGNOSIS — Z91148 Patient's other noncompliance with medication regimen for other reason: Secondary | ICD-10-CM | POA: Diagnosis not present

## 2023-11-12 DIAGNOSIS — T461X6A Underdosing of calcium-channel blockers, initial encounter: Secondary | ICD-10-CM | POA: Diagnosis present

## 2023-11-12 DIAGNOSIS — E1165 Type 2 diabetes mellitus with hyperglycemia: Secondary | ICD-10-CM | POA: Diagnosis not present

## 2023-11-12 DIAGNOSIS — E7800 Pure hypercholesterolemia, unspecified: Secondary | ICD-10-CM | POA: Diagnosis present

## 2023-11-12 DIAGNOSIS — Z7985 Long-term (current) use of injectable non-insulin antidiabetic drugs: Secondary | ICD-10-CM | POA: Diagnosis not present

## 2023-11-12 DIAGNOSIS — M86141 Other acute osteomyelitis, right hand: Secondary | ICD-10-CM | POA: Diagnosis not present

## 2023-11-12 HISTORY — PX: INCISION AND DRAINAGE OF DEEP ABSCESS, HAND: SHX7323

## 2023-11-12 LAB — BASIC METABOLIC PANEL WITH GFR
Anion gap: 12 (ref 5–15)
BUN: 13 mg/dL (ref 6–20)
CO2: 23 mmol/L (ref 22–32)
Calcium: 9 mg/dL (ref 8.9–10.3)
Chloride: 97 mmol/L — ABNORMAL LOW (ref 98–111)
Creatinine, Ser: 1 mg/dL (ref 0.44–1.00)
GFR, Estimated: >60 mL/min (ref >60)
Glucose, Bld: 517 mg/dL (ref 70–99)
Potassium: 3.9 mmol/L (ref 3.5–5.1)
Sodium: 132 mmol/L — ABNORMAL LOW (ref 135–145)

## 2023-11-12 LAB — POCT I-STAT, CHEM 8
BUN: 14 mg/dL (ref 6–20)
Calcium, Ion: 1.18 mmol/L (ref 1.15–1.40)
Chloride: 96 mmol/L — ABNORMAL LOW (ref 98–111)
Creatinine, Ser: 0.9 mg/dL (ref 0.44–1.00)
Glucose, Bld: 513 mg/dL (ref 70–99)
HCT: 34 % — ABNORMAL LOW (ref 36.0–46.0)
Hemoglobin: 11.6 g/dL — ABNORMAL LOW (ref 12.0–15.0)
Potassium: 3.7 mmol/L (ref 3.5–5.1)
Sodium: 134 mmol/L — ABNORMAL LOW (ref 135–145)
TCO2: 26 mmol/L (ref 22–32)

## 2023-11-12 LAB — CBC WITH DIFFERENTIAL/PLATELET
Abs Immature Granulocytes: 0.01 10*3/uL (ref 0.00–0.07)
Basophils Absolute: 0 10*3/uL (ref 0.0–0.1)
Basophils Relative: 0 %
Eosinophils Absolute: 0.1 10*3/uL (ref 0.0–0.5)
Eosinophils Relative: 2 %
HCT: 36.1 % (ref 36.0–46.0)
Hemoglobin: 12.4 g/dL (ref 12.0–15.0)
Immature Granulocytes: 0 %
Lymphocytes Relative: 37 %
Lymphs Abs: 2 10*3/uL (ref 0.7–4.0)
MCH: 29.9 pg (ref 26.0–34.0)
MCHC: 34.3 g/dL (ref 30.0–36.0)
MCV: 87 fL (ref 80.0–100.0)
Monocytes Absolute: 0.3 10*3/uL (ref 0.1–1.0)
Monocytes Relative: 5 %
Neutro Abs: 3.1 10*3/uL (ref 1.7–7.7)
Neutrophils Relative %: 56 %
Platelets: 262 10*3/uL (ref 150–400)
RBC: 4.15 MIL/uL (ref 3.87–5.11)
RDW: 12.1 % (ref 11.5–15.5)
WBC: 5.5 10*3/uL (ref 4.0–10.5)
nRBC: 0 % (ref 0.0–0.2)

## 2023-11-12 LAB — GLUCOSE, CAPILLARY
Glucose-Capillary: 395 mg/dL — ABNORMAL HIGH (ref 70–99)
Glucose-Capillary: 212 mg/dL — ABNORMAL HIGH (ref 70–99)
Glucose-Capillary: 140 mg/dL — ABNORMAL HIGH (ref 70–99)
Glucose-Capillary: 170 mg/dL — ABNORMAL HIGH (ref 70–99)
Glucose-Capillary: 186 mg/dL — ABNORMAL HIGH (ref 70–99)
Glucose-Capillary: 201 mg/dL — ABNORMAL HIGH (ref 70–99)

## 2023-11-12 LAB — CG4 I-STAT (LACTIC ACID): Lactic Acid, Venous: 2.3 mmol/L (ref 0.5–1.9)

## 2023-11-12 LAB — HIV ANTIBODY (ROUTINE TESTING W REFLEX): HIV Screen 4th Generation wRfx: NONREACTIVE

## 2023-11-12 LAB — BETA-HYDROXYBUTYRIC ACID: Beta-Hydroxybutyric Acid: 0.08 mmol/L (ref 0.05–0.27)

## 2023-11-12 SURGERY — INCISION AND DRAINAGE OF DEEP ABSCESS, HAND
Anesthesia: General | Site: Hand | Laterality: Right

## 2023-11-12 MED ORDER — VITAMIN C 500 MG PO TABS
1000.0000 mg | ORAL_TABLET | Freq: Every day | ORAL | Status: DC
  Administered 2023-11-13 – 2023-11-18 (×6): 1000 mg via ORAL
  Filled 2023-11-12 (×6): qty 2

## 2023-11-12 MED ORDER — INSULIN ASPART 100 UNIT/ML IJ SOLN
0.0000 [IU] | INTRAMUSCULAR | Status: DC | PRN
  Administered 2023-11-12: 4 [IU] via SUBCUTANEOUS

## 2023-11-12 MED ORDER — INSULIN ASPART 100 UNIT/ML IJ SOLN
15.0000 [IU] | Freq: Once | INTRAMUSCULAR | Status: AC
  Administered 2023-11-12: 15 [IU] via SUBCUTANEOUS

## 2023-11-12 MED ORDER — PROPOFOL 10 MG/ML IV BOLUS
INTRAVENOUS | Status: DC | PRN
  Administered 2023-11-12: 130 mg via INTRAVENOUS

## 2023-11-12 MED ORDER — DEXAMETHASONE SODIUM PHOSPHATE 10 MG/ML IJ SOLN
INTRAMUSCULAR | Status: AC
  Filled 2023-11-12: qty 1

## 2023-11-12 MED ORDER — INSULIN ASPART 100 UNIT/ML IJ SOLN
INTRAMUSCULAR | Status: AC
  Filled 2023-11-12: qty 1

## 2023-11-12 MED ORDER — ONDANSETRON HCL 4 MG/2ML IJ SOLN
INTRAMUSCULAR | Status: DC | PRN
  Administered 2023-11-12: 4 mg via INTRAVENOUS

## 2023-11-12 MED ORDER — HYDROCODONE-ACETAMINOPHEN 7.5-325 MG PO TABS: 1.0000 | ORAL_TABLET | ORAL | Status: DC | PRN

## 2023-11-12 MED ORDER — DEXAMETHASONE SODIUM PHOSPHATE 10 MG/ML IJ SOLN
INTRAMUSCULAR | Status: DC | PRN
  Administered 2023-11-12: 10 mg via INTRAVENOUS

## 2023-11-12 MED ORDER — LIDOCAINE 2% (20 MG/ML) 5 ML SYRINGE
INTRAMUSCULAR | Status: DC | PRN
  Administered 2023-11-12: 60 mg via INTRAVENOUS

## 2023-11-12 MED ORDER — SODIUM CHLORIDE 0.9% FLUSH: 10.0000 mL | INTRAVENOUS | Status: DC | PRN

## 2023-11-12 MED ORDER — PROPOFOL 10 MG/ML IV BOLUS
INTRAVENOUS | Status: AC
  Filled 2023-11-12: qty 20

## 2023-11-12 MED ORDER — HYDROCODONE-ACETAMINOPHEN 5-325 MG PO TABS
1.0000 | ORAL_TABLET | ORAL | Status: DC | PRN
  Administered 2023-11-15: 2 via ORAL
  Filled 2023-11-12: qty 2

## 2023-11-12 MED ORDER — ONDANSETRON HCL 4 MG/2ML IJ SOLN
INTRAMUSCULAR | Status: AC
  Filled 2023-11-12: qty 2

## 2023-11-12 MED ORDER — MIDAZOLAM HCL 2 MG/2ML IJ SOLN
INTRAMUSCULAR | Status: AC
  Filled 2023-11-12: qty 2

## 2023-11-12 MED ORDER — BUPIVACAINE HCL (PF) 0.25 % IJ SOLN
INTRAMUSCULAR | Status: AC
  Filled 2023-11-12: qty 20

## 2023-11-12 MED ORDER — INSULIN GLARGINE-YFGN 100 UNIT/ML ~~LOC~~ SOLN
5.0000 [IU] | Freq: Once | SUBCUTANEOUS | Status: DC
  Filled 2023-11-12: qty 0.05

## 2023-11-12 MED ORDER — AMLODIPINE BESYLATE 5 MG PO TABS: 10.0000 mg | ORAL_TABLET | Freq: Every morning | ORAL | Status: DC

## 2023-11-12 MED ORDER — FENTANYL CITRATE (PF) 250 MCG/5ML IJ SOLN
INTRAMUSCULAR | Status: AC
  Filled 2023-11-12: qty 5

## 2023-11-12 MED ORDER — SODIUM CHLORIDE 0.9 % IV SOLN
3.0000 g | Freq: Once | INTRAVENOUS | Status: AC
  Administered 2023-11-12: 3 g via INTRAVENOUS
  Filled 2023-11-12: qty 8

## 2023-11-12 MED ORDER — ACETAMINOPHEN 325 MG PO TABS: 650.0000 mg | ORAL_TABLET | Freq: Four times a day (QID) | ORAL | Status: DC | PRN

## 2023-11-12 MED ORDER — LACTATED RINGERS IV SOLN: INTRAVENOUS | Status: DC

## 2023-11-12 MED ORDER — INSULIN ASPART 100 UNIT/ML IJ SOLN
0.0000 [IU] | Freq: Three times a day (TID) | INTRAMUSCULAR | Status: DC
  Administered 2023-11-12 – 2023-11-13 (×2): 5 [IU] via SUBCUTANEOUS
  Administered 2023-11-13: 15 [IU] via SUBCUTANEOUS
  Administered 2023-11-14: 5 [IU] via SUBCUTANEOUS
  Administered 2023-11-14 (×2): 11 [IU] via SUBCUTANEOUS
  Administered 2023-11-15: 8 [IU] via SUBCUTANEOUS
  Administered 2023-11-15: 11 [IU] via SUBCUTANEOUS

## 2023-11-12 MED ORDER — INSULIN ASPART 100 UNIT/ML IJ SOLN
0.0000 [IU] | Freq: Every day | INTRAMUSCULAR | Status: DC
  Administered 2023-11-12: 2 [IU] via SUBCUTANEOUS
  Administered 2023-11-13: 3 [IU] via SUBCUTANEOUS
  Administered 2023-11-14: 2 [IU] via SUBCUTANEOUS
  Administered 2023-11-15 – 2023-11-17 (×2): 3 [IU] via SUBCUTANEOUS

## 2023-11-12 MED ORDER — GADOBUTROL 1 MMOL/ML IV SOLN
7.5000 mL | Freq: Once | INTRAVENOUS | Status: AC | PRN
  Administered 2023-11-12: 7.5 mL via INTRAVENOUS

## 2023-11-12 MED ORDER — ACETAMINOPHEN 325 MG PO TABS: 325.0000 mg | ORAL_TABLET | Freq: Four times a day (QID) | ORAL | Status: DC | PRN

## 2023-11-12 MED ORDER — FENTANYL CITRATE (PF) 250 MCG/5ML IJ SOLN
INTRAMUSCULAR | Status: DC | PRN
Start: 2023-11-12 — End: 2023-11-12
  Administered 2023-11-12 (×2): 50 ug via INTRAVENOUS

## 2023-11-12 MED ORDER — AMLODIPINE BESYLATE 5 MG PO TABS
5.0000 mg | ORAL_TABLET | Freq: Every morning | ORAL | Status: DC
  Administered 2023-11-12 – 2023-11-14 (×3): 5 mg via ORAL
  Filled 2023-11-12 (×3): qty 1

## 2023-11-12 MED ORDER — LABETALOL HCL 5 MG/ML IV SOLN
20.0000 mg | Freq: Once | INTRAVENOUS | Status: AC
  Administered 2023-11-12: 20 mg via INTRAVENOUS
  Filled 2023-11-12: qty 4

## 2023-11-12 MED ORDER — ACETAMINOPHEN 650 MG RE SUPP: 650.0000 mg | Freq: Four times a day (QID) | RECTAL | Status: DC | PRN

## 2023-11-12 MED ORDER — BUPIVACAINE HCL (PF) 0.25 % IJ SOLN
INTRAMUSCULAR | Status: DC | PRN
  Administered 2023-11-12: 10 mL

## 2023-11-12 MED ORDER — VANCOMYCIN HCL 1500 MG/300ML IV SOLN
1500.0000 mg | Freq: Once | INTRAVENOUS | Status: AC
  Administered 2023-11-12: 1500 mg via INTRAVENOUS
  Filled 2023-11-12: qty 300

## 2023-11-12 MED ORDER — 0.9 % SODIUM CHLORIDE (POUR BTL) OPTIME
TOPICAL | Status: DC | PRN
  Administered 2023-11-12: 1000 mL

## 2023-11-12 MED ORDER — HYDROCHLOROTHIAZIDE 25 MG PO TABS
25.0000 mg | ORAL_TABLET | Freq: Every day | ORAL | Status: DC
  Administered 2023-11-12 – 2023-11-18 (×7): 25 mg via ORAL
  Filled 2023-11-12 (×7): qty 1

## 2023-11-12 MED ORDER — CHLORHEXIDINE GLUCONATE 0.12 % MT SOLN
15.0000 mL | Freq: Once | OROMUCOSAL | Status: AC
  Administered 2023-11-12: 15 mL via OROMUCOSAL
  Filled 2023-11-12: qty 15

## 2023-11-12 MED ORDER — MORPHINE SULFATE (PF) 2 MG/ML IV SOLN
0.5000 mg | INTRAVENOUS | Status: DC | PRN
  Administered 2023-11-14: 1 mg via INTRAVENOUS
  Filled 2023-11-12: qty 1

## 2023-11-12 MED ORDER — ORAL CARE MOUTH RINSE: 15.0000 mL | Freq: Once | OROMUCOSAL | Status: AC

## 2023-11-12 MED ORDER — ATORVASTATIN CALCIUM 80 MG PO TABS
80.0000 mg | ORAL_TABLET | Freq: Every day | ORAL | Status: DC
  Administered 2023-11-13 – 2023-11-18 (×6): 80 mg via ORAL
  Filled 2023-11-12 (×6): qty 1

## 2023-11-12 MED ORDER — SODIUM CHLORIDE 0.9 % IV SOLN
3.0000 g | Freq: Four times a day (QID) | INTRAVENOUS | Status: DC
  Administered 2023-11-12 – 2023-11-13 (×4): 3 g via INTRAVENOUS
  Filled 2023-11-12 (×4): qty 8

## 2023-11-12 MED ORDER — ACETAMINOPHEN 500 MG PO TABS
500.0000 mg | ORAL_TABLET | Freq: Four times a day (QID) | ORAL | Status: AC
  Administered 2023-11-13 (×4): 500 mg via ORAL
  Filled 2023-11-12 (×4): qty 1

## 2023-11-12 MED ORDER — ENOXAPARIN SODIUM 40 MG/0.4ML IJ SOSY: 40.0000 mg | PREFILLED_SYRINGE | INTRAMUSCULAR | Status: DC

## 2023-11-12 SURGICAL SUPPLY — 50 items
ADAPTER CATH SYR TO TUBING 38M (ADAPTER) ×1 IMPLANT
BAG COUNTER SPONGE SURGICOUNT (BAG) ×1 IMPLANT
BNDG COHESIVE 1X5 TAN STRL LF (GAUZE/BANDAGES/DRESSINGS) IMPLANT
BNDG COMPR ESMARK 4X3 LF (GAUZE/BANDAGES/DRESSINGS) IMPLANT
BNDG ELASTIC 3INX 5YD STR LF (GAUZE/BANDAGES/DRESSINGS) ×1 IMPLANT
BNDG ELASTIC 4X5.8 VLCR STR LF (GAUZE/BANDAGES/DRESSINGS) ×1 IMPLANT
BNDG GAUZE DERMACEA FLUFF 4 (GAUZE/BANDAGES/DRESSINGS) ×1 IMPLANT
CANNULA VESSEL 3MM 2 BLNT TIP (CANNULA) IMPLANT
CORD BIPOLAR FORCEPS 12FT (ELECTRODE) ×1 IMPLANT
COVER SURGICAL LIGHT HANDLE (MISCELLANEOUS) ×1 IMPLANT
CUFF TOURN SGL QUICK 18X4 (TOURNIQUET CUFF) IMPLANT
CUFF TRNQT CYL 24X4X16.5-23 (TOURNIQUET CUFF) IMPLANT
DRAIN PENROSE 12X.25 LTX STRL (MISCELLANEOUS) IMPLANT
DRSG XEROFORM 1X8 (GAUZE/BANDAGES/DRESSINGS) IMPLANT
GAUZE PACKING IODOFORM 1/4X15 (PACKING) IMPLANT
GAUZE PAD ABD 8X10 STRL (GAUZE/BANDAGES/DRESSINGS) ×2 IMPLANT
GAUZE SPONGE 4X4 12PLY STRL (GAUZE/BANDAGES/DRESSINGS) ×1 IMPLANT
GAUZE XEROFORM 1X8 LF (GAUZE/BANDAGES/DRESSINGS) ×1 IMPLANT
GLOVE BIO SURGEON STRL SZ7.5 (GLOVE) ×1 IMPLANT
GLOVE BIOGEL PI IND STRL 8 (GLOVE) ×1 IMPLANT
GLOVE BIOGEL PI IND STRL 8.5 (GLOVE) IMPLANT
GLOVE PI ORTHO PRO STRL SZ8 (GLOVE) IMPLANT
GLOVE SURG ORTHO 8.0 STRL STRW (GLOVE) IMPLANT
GOWN STRL REUS W/ TWL LRG LVL3 (GOWN DISPOSABLE) ×1 IMPLANT
GOWN STRL REUS W/ TWL XL LVL3 (GOWN DISPOSABLE) ×1 IMPLANT
KIT BASIN OR (CUSTOM PROCEDURE TRAY) ×1 IMPLANT
KIT TURNOVER KIT B (KITS) ×1 IMPLANT
LOOP VASCLR MAXI BLUE 18IN ST (MISCELLANEOUS) IMPLANT
LOOPS VASCLR MAXI BLUE 18IN ST (MISCELLANEOUS) IMPLANT
MANIFOLD NEPTUNE II (INSTRUMENTS) IMPLANT
NDL HYPO 25X1 1.5 SAFETY (NEEDLE) IMPLANT
NEEDLE HYPO 25X1 1.5 SAFETY (NEEDLE) IMPLANT
NS IRRIG 1000ML POUR BTL (IV SOLUTION) ×1 IMPLANT
PACK ORTHO EXTREMITY (CUSTOM PROCEDURE TRAY) ×1 IMPLANT
PAD ARMBOARD POSITIONER FOAM (MISCELLANEOUS) ×2 IMPLANT
SET CYSTO W/LG BORE CLAMP LF (SET/KITS/TRAYS/PACK) IMPLANT
SOL PREP POV-IOD 4OZ 10% (MISCELLANEOUS) ×2 IMPLANT
SPIKE FLUID TRANSFER (MISCELLANEOUS) ×1 IMPLANT
SPONGE T-LAP 4X18 ~~LOC~~+RFID (SPONGE) ×1 IMPLANT
SUT ETHILON 4 0 P 3 18 (SUTURE) IMPLANT
SUT ETHILON 4 0 PS 2 18 (SUTURE) IMPLANT
SUT MON AB 5-0 P3 18 (SUTURE) IMPLANT
SWAB COLLECTION DEVICE MRSA (MISCELLANEOUS) IMPLANT
SWAB CULTURE ESWAB REG 1ML (MISCELLANEOUS) IMPLANT
SYR 20ML LL LF (SYRINGE) ×1 IMPLANT
SYR CONTROL 10ML LL (SYRINGE) IMPLANT
TOWEL GREEN STERILE (TOWEL DISPOSABLE) ×1 IMPLANT
TUBE CONNECTING 12X1/4 (SUCTIONS) ×1 IMPLANT
UNDERPAD 30X36 HEAVY ABSORB (UNDERPADS AND DIAPERS) ×1 IMPLANT
YANKAUER SUCT BULB TIP NO VENT (SUCTIONS) ×1 IMPLANT

## 2023-11-12 NOTE — Assessment & Plan Note (Signed)
 LA 2.3 in setting of HTN, hyperglycemia and infection. Low concern for sepsis.  -f/u repeat LA, if no improvement, can consider IV fluids

## 2023-11-12 NOTE — Assessment & Plan Note (Addendum)
 Significantly elevated in setting of not having medications for last few months.  No signs of endorgan damage. s/p IV labetalol in ED. - Restart home HCTZ 25 mg daily and amlodipine  5 mg daily, adjust as needed

## 2023-11-12 NOTE — ED Notes (Signed)
 Patient transported to MRI

## 2023-11-12 NOTE — Anesthesia Procedure Notes (Signed)
 Procedure Name: LMA Insertion Date/Time: 11/12/2023 6:02 PM  Performed by: Julien Manus, CRNAPre-anesthesia Checklist: Patient identified, Emergency Drugs available, Suction available and Patient being monitored Patient Re-evaluated:Patient Re-evaluated prior to induction Oxygen Delivery Method: Circle System Utilized Preoxygenation: Pre-oxygenation with 100% oxygen Induction Type: IV induction Ventilation: Mask ventilation without difficulty LMA: LMA inserted LMA Size: 4.0 Number of attempts: 1 Airway Equipment and Method: Bite block Placement Confirmation: positive ETCO2 Tube secured with: Tape Dental Injury: Teeth and Oropharynx as per pre-operative assessment

## 2023-11-12 NOTE — Consult Note (Signed)
 Sally Mclaughlin is an 58 y.o. female.   Chief Complaint: Right index finger infection HPI: 58 year old female states over the past week she has had increased swelling and erythema of the pad of the right index finger.  The skin is broken open.  She was seen in the emergency department early this morning and admitted to medicine for IV antibiotics.  Allergies: No Known Allergies  Past Medical History:  Diagnosis Date   Diabetes mellitus without complication (HCC)    Hypercholesteremia    Hypertension    Suprapubic abscess 09/06/2019    Past Surgical History:  Procedure Laterality Date   HERNIA REPAIR      Family History: Family History  Problem Relation Age of Onset   Ovarian cancer Sister    Colon cancer Neg Hx    Colon polyps Neg Hx    Esophageal cancer Neg Hx    Stomach cancer Neg Hx    Rectal cancer Neg Hx     Social History:   reports that she has been smoking cigarettes. She has never used smokeless tobacco. She reports current alcohol use. She reports that she does not use drugs.  Medications: Medications Prior to Admission  Medication Sig Dispense Refill   acetaminophen (TYLENOL) 500 MG tablet Take 1,000 mg by mouth every 6 (six) hours as needed for mild pain (pain score 1-3) or headache.      Results for orders placed or performed during the hospital encounter of 11/11/23 (from the past 48 hours)  CBC with Differential/Platelet     Status: None   Collection Time: 11/12/23  2:00 AM  Result Value Ref Range   WBC 5.5 4.0 - 10.5 K/uL   RBC 4.15 3.87 - 5.11 MIL/uL   Hemoglobin 12.4 12.0 - 15.0 g/dL   HCT 63.8 63.9 - 53.9 %   MCV 87.0 80.0 - 100.0 fL   MCH 29.9 26.0 - 34.0 pg   MCHC 34.3 30.0 - 36.0 g/dL   RDW 87.8 88.4 - 84.4 %   Platelets 262 150 - 400 K/uL   nRBC 0.0 0.0 - 0.2 %   Neutrophils Relative % 56 %   Neutro Abs 3.1 1.7 - 7.7 K/uL   Lymphocytes Relative 37 %   Lymphs Abs 2.0 0.7 - 4.0 K/uL   Monocytes Relative 5 %   Monocytes Absolute 0.3 0.1 -  1.0 K/uL   Eosinophils Relative 2 %   Eosinophils Absolute 0.1 0.0 - 0.5 K/uL   Basophils Relative 0 %   Basophils Absolute 0.0 0.0 - 0.1 K/uL   Immature Granulocytes 0 %   Abs Immature Granulocytes 0.01 0.00 - 0.07 K/uL    Comment: Performed at [facility], [address] [place], [place] [address]  Basic metabolic panel with GFR     Status: Abnormal   Collection Time: 11/12/23  4:00 AM  Result Value Ref Range   Sodium 132 (L) 135 - 145 mmol/L   Potassium 3.9 3.5 - 5.1 mmol/L   Chloride 97 (L) 98 - 111 mmol/L   CO2 23 22 - 32 mmol/L   Glucose, Bld 517 (HH) 70 - 99 mg/dL    Comment: CRITICAL RESULT CALLED TO, READ BACK BY AND VERIFIED WITH CARDIERI MORRIS L, RN [phone] 11/12/2023 SANDOVAL K Glucose reference range applies only to samples taken after fasting for at least 8 hours.    BUN 13 6 - 20 mg/dL   Creatinine, Ser 8.99 0.44 - 1.00 mg/dL   Calcium  9.0 8.9 -  10.3 mg/dL   GFR, Estimated >39 >39 mL/min    Comment: (NOTE) Calculated using the CKD-EPI Creatinine Equation (2021)    Anion gap 12 5 - 15    Comment: Performed at [facility], [address], [place], [place] [address]  Culture, blood (Routine X 2) w Reflex to ID Panel     Status: None (Preliminary result)   Collection Time: 11/12/23  4:48 AM   Specimen: BLOOD  Result Value Ref Range   Specimen Description BLOOD SITE NOT SPECIFIED    Special Requests      BOTTLES DRAWN AEROBIC AND ANAEROBIC Blood Culture results may not be optimal due to an inadequate volume of blood received in culture bottles   Culture      NO GROWTH < 12 HOURS Performed at [facility], [address], [place], [place] [address]    Report Status PENDING   CG4 I-STAT (Lactic acid)     Status: Abnormal   Collection Time: 11/12/23  5:24 AM  Result Value Ref Range   Lactic Acid, Venous 2.3 (HH) 0.5 - 1.9 mmol/L   Comment NOTIFIED PHYSICIAN   I-STAT, chem 8     Status: Abnormal   Collection Time: 11/12/23  5:29 AM  Result Value  Ref Range   Sodium 134 (L) 135 - 145 mmol/L   Potassium 3.7 3.5 - 5.1 mmol/L   Chloride 96 (L) 98 - 111 mmol/L   BUN 14 6 - 20 mg/dL   Creatinine, Ser 9.09 0.44 - 1.00 mg/dL   Glucose, Bld 486 (HH) 70 - 99 mg/dL    Comment: Glucose reference range applies only to samples taken after fasting for at least 8 hours.   Calcium , Ion 1.18 1.15 - 1.40 mmol/L   TCO2 26 22 - 32 mmol/L   Hemoglobin 11.6 (L) 12.0 - 15.0 g/dL   HCT 65.9 (L) 63.9 - 53.9 %   Comment NOTIFIED PHYSICIAN   Culture, blood (Routine X 2) w Reflex to ID Panel     Status: None (Preliminary result)   Collection Time: 11/12/23  5:53 AM   Specimen: BLOOD LEFT HAND  Result Value Ref Range   Specimen Description BLOOD LEFT HAND    Special Requests      BOTTLES DRAWN AEROBIC AND ANAEROBIC Blood Culture adequate volume   Culture      NO GROWTH < 12 HOURS Performed at [facility], [address] [place], [place] [address]    Report Status PENDING   Glucose, capillary     Status: Abnormal   Collection Time: 11/12/23  6:36 AM  Result Value Ref Range   Glucose-Capillary 395 (H) 70 - 99 mg/dL    Comment: Glucose reference range applies only to samples taken after fasting for at least 8 hours.  HIV Antibody (routine testing w rflx)     Status: None   Collection Time: 11/12/23  9:39 AM  Result Value Ref Range   HIV Screen 4th Generation wRfx Non Reactive Non Reactive    Comment: Performed at [facility], [address], [place], [place] [address]  Beta-hydroxybutyric acid     Status: None   Collection Time: 11/12/23  9:39 AM  Result Value Ref Range   Beta-Hydroxybutyric Acid 0.08 0.05 - 0.27 mmol/L    Comment: Performed at [facility], [address], [place], [place] [address]  Glucose, capillary     Status: Abnormal   Collection Time: 11/12/23 11:31 AM  Result Value Ref Range   Glucose-Capillary 212 (H) 70 - 99 mg/dL    Comment: Glucose reference range applies only to samples taken after fasting for at  least 8 hours.  Glucose, capillary     Status: Abnormal   Collection Time: 11/12/23  3:49 PM  Result Value Ref Range   Glucose-Capillary 140 (H) 70 - 99 mg/dL    Comment: Glucose reference range applies only to samples taken after fasting for at least 8 hours.    MR FINGERS RIGHT W WO CONTRAST Result Date: 11/12/2023 CLINICAL DATA:  Index finger infection EXAM: MRI OF THE RIGHT FINGERS WITHOUT AND WITH CONTRAST TECHNIQUE: Multiplanar, multisequence MR imaging of the right fingers with attention to the index finger was performed before and after the administration of intravenous contrast. CONTRAST:  7.5mL GADAVIST GADOBUTROL 1 MMOL/ML IV SOLN COMPARISON:  Radiographs 11/12/2023 FINDINGS: Bones/Joint/Cartilage Abnormal edema and enhancement in the distal phalanx of the index finger and distally in the middle phalanx, suspicious for osteomyelitis. Small degenerative subcortical cyst along the head of the fifth metacarpal, with similar lesion or small erosion along the radial side of the base of the proximal phalanx small finger. Ligaments No gross abnormality observed. Muscles and Tendons Unremarkable Soft tissues Subcutaneous edema and enhancement throughout the left finger but especially distally, suspicious for cellulitis. No drainable abscess. IMPRESSION: 1. Abnormal edema and enhancement in the distal phalanx of the index finger and distally in the middle phalanx, suspicious for osteomyelitis. 2. Subcutaneous edema and enhancement throughout the left index finger but especially distally, suspicious for cellulitis. No drainable abscess. 3. Small degenerative subcortical cyst along the head of the fifth metacarpal, with similar lesion or small erosion along the radial side of the base of the proximal phalanx small finger. Electronically Signed   By: Ryan Salvage M.D.   On: 11/12/2023 09:12   DG Finger Index Right Result Date: 11/12/2023 CLINICAL DATA:  infection infection / pt reports a 2 week  history of right index finger swelling, discoloration, weeping. Patient is a diabetic and does not have sensation in her finger. Reports that she may have torn her skin while work. EXAM: RIGHT INDEX FINGER 2+V COMPARISON:  None Available. FINDINGS: No definite cortical erosion or destruction. There is no evidence of fracture or dislocation. Distal interphalangeal joint degenerative changes. There is no evidence of severe arthropathy or other focal bone abnormality. Distal second digit dermal defect with subcutaneus soft tissue emphysema. No retained radiopaque foreign body. IMPRESSION: 1. No radiographic findings to suggest osteomyelitis. Limited evaluation due to overlapping osseous structures and overlying soft tissues. If high clinical concern, please consider MRI for further evaluation (with intravenous contrast if GFR greater than 30). 2.  No acute displaced fracture or dislocation. Electronically Signed   By: Morgane  Naveau M.D.   On: 11/12/2023 02:55      Blood pressure (!) 152/77, pulse 73, temperature 98.5 F (36.9 C), temperature source Oral, resp. rate 18, height 5' 6 (1.676 m), weight 72.6 kg, last menstrual period 11/25/2012, SpO2 98%.  General appearance: alert, cooperative, and appears stated age Head: Normocephalic, without obvious abnormality, atraumatic Neck: supple, symmetrical, trachea midline Extremities: The distal phalanx of the index finger is swollen.  The skin is peeled off distally.  There is fluctuance underneath the skin more proximally and proximal to the nail.  No erythema or fluctuance proximal to the DIP joint.  The pad is full.  There is a small healed over wound distally at the tip of the finger.  Skin: Skin color, texture, turgor normal. 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  Recommend incision and drainage in the operating room including bone of the distal  phalanx for cultures.  Risks, benefits and alternatives of surgery were discussed including risks of blood loss, infection, damage to nerves/vessels/tendons/ligament/bone, failure of surgery, need for additional surgery, complication with wound healing, stiffness, need for repeat irrigation and debridement, amputation.  She voiced understanding of these risks and elected to proceed.    Niyam Bisping 11/12/2023, 5:38 PM

## 2023-11-12 NOTE — Transfer of Care (Signed)
 Immediate Anesthesia Transfer of Care Note  Patient: Sally Mclaughlin  Procedure(s) Performed: INCISION AND DRAINAGE OF DEEP ABSCESS, HAND (Right: Hand)  Patient Location: PACU  Anesthesia Type:General  Level of Consciousness: awake, alert , and oriented  Airway & Oxygen Therapy: Patient Spontanous Breathing and Patient connected to nasal cannula oxygen  Post-op Assessment: Report given to RN and Post -op Vital signs reviewed and stable  Post vital signs: Reviewed and stable  Last Vitals:  Vitals Value Taken Time  BP 123/56 11/12/23 18:51  Temp    Pulse 64 11/12/23 18:55  Resp 8 11/12/23 18:55  SpO2 98 % 11/12/23 18:55  Vitals shown include unfiled device data.  Last Pain:  Vitals:   11/12/23 1154  TempSrc:   PainSc: 0-No pain         Complications: No notable events documented.

## 2023-11-12 NOTE — ED Provider Notes (Signed)
 [place] EMERGENCY DEPARTMENT AT [facility] Provider Note   CSN: [id number] Arrival date & time: 11/11/23  1749     Patient presents with: Finger Injury   Sally Mclaughlin is a 58 y.o. female.   Patient presents to the emergency department for evaluation of finger infection.  Patient was referred here from urgent care.  Patient with pain, swelling of the right index finger.  Symptoms ongoing for a couple of weeks.  She does have a history of diabetes, reports decreased sensation in her fingers.  She is unaware of any direct injury to the area.       Prior to Admission medications   Medication Sig Start Date End Date Taking? Authorizing Provider  amLODipine  (NORVASC ) 5 MG tablet Take 1 tablet (5 mg total) by mouth at bedtime. 07/29/22   Joshua Domino, DO  atorvastatin  (LIPITOR) 80 MG tablet Take 1 tablet (80 mg total) by mouth daily. 07/17/22   Joshua Domino, DO  dorzolamide -timolol  (COSOPT ) 2-0.5 % ophthalmic solution Place 1 drop into both eyes 2 (two) times daily. 10/28/22     dorzolamide -timolol  (COSOPT ) 2-0.5 % ophthalmic solution Place 1 drop into both eyes 2 (two) times daily. 01/19/23     empagliflozin  (JARDIANCE ) 10 MG TABS tablet Take 1 tablet (10 mg total) by mouth daily. 12/10/22   Joshua Domino, DO  fluconazole  (DIFLUCAN ) 150 MG tablet Take 1 tablet (150 mg total) by mouth now. If symptoms persist in 3 days, take the second tablet. 07/17/22   Joshua Domino, DO  gabapentin  (NEURONTIN ) 100 MG capsule Take 1 capsule (100 mg total) by mouth 3 (three) times daily. If needed, can increase to 200 mg 3 times daily 12/10/22   Joshua Domino, DO  hydrochlorothiazide  (HYDRODIURIL ) 25 MG tablet Take 1 tablet (25 mg total) by mouth daily. 12/10/22   Joshua Domino, DO  Semaglutide , 1 MG/DOSE, 4 MG/3ML SOPN Inject 1 mg into the skin once a week. 12/10/22   Joshua Domino, DO  timolol  (TIMOPTIC ) 0.5 % ophthalmic solution Place 1 drop into both eyes 2 (two) times daily. 08/24/22       Allergies:  Patient has no known allergies.    Review of Systems  Updated Vital Signs BP (!) 199/82 (BP Location: Right Arm)   Pulse 69   Temp (!) 97.5 F (36.4 C)   Resp 18   Ht 5' 6 (1.676 m)   Wt 72.6 kg   LMP 11/25/2012   SpO2 100%   BMI 25.82 kg/m   Physical Exam Vitals and nursing note reviewed.  Constitutional:      General: She is not in acute distress.    Appearance: She is well-developed.  HENT:     Head: Normocephalic and atraumatic.     Mouth/Throat:     Mouth: Mucous membranes are moist.   Eyes:     General: Vision grossly intact. Gaze aligned appropriately.     Extraocular Movements: Extraocular movements intact.     Conjunctiva/sclera: Conjunctivae normal.    Cardiovascular:     Rate and Rhythm: Normal rate and regular rhythm.     Pulses: Normal pulses.     Heart sounds: Normal heart sounds, S1 normal and S2 normal. No murmur heard.    No friction rub. No gallop.  Pulmonary:     Effort: Pulmonary effort is normal. No respiratory distress.     Breath sounds: Normal breath sounds.  Abdominal:     General: Bowel sounds are normal.     Palpations:  Abdomen is soft.     Tenderness: There is no abdominal tenderness. There is no guarding or rebound.     Hernia: No hernia is present.   Musculoskeletal:        General: No swelling.     Right hand: Swelling (Distal index finger) present. Decreased sensation (Distal index finger).     Cervical back: Full passive range of motion without pain, normal range of motion and neck supple. No spinous process tenderness or muscular tenderness. Normal range of motion.     Right lower leg: No edema.     Left lower leg: No edema.   Skin:    General: Skin is warm and dry.     Capillary Refill: Capillary refill takes less than 2 seconds.     Findings: Wound (Distal index finger) present. No ecchymosis, erythema or rash.   Neurological:     General: No focal deficit present.     Mental Status: She is alert and oriented to person,  place, and time.     GCS: GCS eye subscore is 4. GCS verbal subscore is 5. GCS motor subscore is 6.     Cranial Nerves: Cranial nerves 2-12 are intact.     Sensory: Sensation is intact.     Motor: Motor function is intact.     Coordination: Coordination is intact.   Psychiatric:        Attention and Perception: Attention normal.        Mood and Affect: Mood normal.        Speech: Speech normal.        Behavior: Behavior normal.        (all labs ordered are listed, but only abnormal results are displayed) Labs Reviewed  CULTURE, BLOOD (ROUTINE X 2)  CULTURE, BLOOD (ROUTINE X 2)  CBC WITH DIFFERENTIAL/PLATELET  BASIC METABOLIC PANEL WITH GFR  I-STAT CG4 LACTIC ACID, ED    EKG: None  Radiology: DG Finger Index Right Result Date: 11/12/2023 CLINICAL DATA:  infection infection / pt reports a 2 week history of right index finger swelling, discoloration, weeping. Patient is a diabetic and does not have sensation in her finger. Reports that she may have torn her skin while work. EXAM: RIGHT INDEX FINGER 2+V COMPARISON:  None Available. FINDINGS: No definite cortical erosion or destruction. There is no evidence of fracture or dislocation. Distal interphalangeal joint degenerative changes. There is no evidence of severe arthropathy or other focal bone abnormality. Distal second digit dermal defect with subcutaneus soft tissue emphysema. No retained radiopaque foreign body. IMPRESSION: 1. No radiographic findings to suggest osteomyelitis. Limited evaluation due to overlapping osseous structures and overlying soft tissues. If high clinical concern, please consider MRI for further evaluation (with intravenous contrast if GFR greater than 30). 2.  No acute displaced fracture or dislocation. Electronically Signed   By: Morgane  Naveau M.D.   On: 11/12/2023 02:55     Procedures   Medications Ordered in the ED - No data to display                                  Medical Decision  Making Amount and/or Complexity of Data Reviewed External Data Reviewed: notes. Labs: ordered. Decision-making details documented in ED Course. Radiology: ordered and independent interpretation performed. Decision-making details documented in ED Course.   Differential diagnosis considered includes, but not limited to: Cellulitis; felon; osteomyelitis  Referred to the emergency department after  being evaluated in urgent care for finger problem.  Patient with significant distal swelling, desquamation and signs of infection.  X-ray does not show obvious osteomyelitis.  No fever, no leukocytosis.  Discussed with Dr. Murrell, on-call for hand surgery.  He has reviewed the images.  He agrees that the patient should have an exploration/washout in the OR.  Will admit to medicine, initiate antibiotic coverage.  Will order MRI to rule out osteomyelitis     Final diagnoses:  Finger infection    ED Discharge Orders     None          Haze Lonni PARAS, MD 11/12/23 [phone]

## 2023-11-12 NOTE — Anesthesia Preprocedure Evaluation (Addendum)
 Anesthesia Evaluation  Patient identified by MRN, date of birth, ID band Patient awake    Reviewed: Allergy & Precautions, NPO status , Patient's Chart, lab work & pertinent test results  Airway Mallampati: II  TM Distance: >3 FB Neck ROM: Full    Dental no notable dental hx.    Pulmonary neg pulmonary ROS, Current Smoker and Patient abstained from smoking.   Pulmonary exam normal        Cardiovascular hypertension,  Rhythm:Regular Rate:Normal     Neuro/Psych negative neurological ROS  negative psych ROS   GI/Hepatic negative GI ROS, Neg liver ROS,,,  Endo/Other  diabetes, Poorly Controlled, Type 2    Renal/GU negative Renal ROS  negative genitourinary   Musculoskeletal negative musculoskeletal ROS (+)    Abdominal Normal abdominal exam  (+)   Peds  Hematology Lab Results      Component                Value               Date                      WBC                      5.5                 11/12/2023                HGB                      11.6 (L)            11/12/2023                HCT                      34.0 (L)            11/12/2023                MCV                      87.0                11/12/2023                PLT                      262                 11/12/2023              Anesthesia Other Findings   Reproductive/Obstetrics                             Anesthesia Physical Anesthesia Plan  ASA: 3  Anesthesia Plan: General   Post-op Pain Management:    Induction: Intravenous  PONV Risk Score and Plan: 2 and Ondansetron , Dexamethasone, Midazolam and Treatment may vary due to age or medical condition  Airway Management Planned: Mask and LMA  Additional Equipment: None  Intra-op Plan:   Post-operative Plan: Extubation in OR  Informed Consent: I have reviewed the patients History and Physical, chart, labs and discussed the procedure including the risks, benefits  and alternatives for the proposed anesthesia with the patient or authorized representative who has  indicated his/her understanding and acceptance.     Dental advisory given  Plan Discussed with: CRNA  Anesthesia Plan Comments:        Anesthesia Quick Evaluation

## 2023-11-12 NOTE — Assessment & Plan Note (Addendum)
 Uncontrolled in setting of not having home insulin .  Last A1c 10/19/2022 of 7.6.  Tight sugar control be important in setting of finger infection.  Blood sugar decreased from 500s to 300s after 15 units NovoLog . AG 12, BHB 0.08.  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 - Follow-up A1c - Semglee  5 units for 1 dose, reorder Semglee  tomorrow - mSSI - restart lipitor 80 mg

## 2023-11-12 NOTE — Plan of Care (Addendum)
 FMTS Interim Progress Note  Patient presented to Urgent care ealier 6/26 due to her finger looking weird. She was sent to the ED. X ray neg for osteo. MRI pending. Hand surgery was consulted and they are planning OR debridement today.   Medical admit for HTN and hyperglycemia. Of note patient has not been in clinic since 11/2022 for diabetic or HTN management.   O: BP (!) 222/94   Pulse 72   Temp 98.4 F (36.9 C) (Oral)   Resp 16   Ht 5' 6 (1.676 m)   Wt 72.6 kg   LMP 11/25/2012   SpO2 100%   BMI 25.82 kg/m   General: Well-appearing, no acute distress Cardio: RRR, no murmur on exam Pulm: clear, No increased work of breathing Abdomen: Soft, bowel sounds present, nontender Extremity: No peripheral edema, right pointer finger with skin splitting at the 1st MTP joint with swelling and edema   A/P: Finger Infection:  Full admit orders pending day team. Hand surgery consulted - MRI pending  - NPO for OR I&D  - hand surgery consulted  HTN:  Severely elevated from baseline. No neurological symptoms. Likely contributed from 12 hour ED stent without home Amlodipine  5 mg and hydrochlorothiazide  25 mg daily.  - restart home meds  - increase amlodipine  to 10 mg (she was not well controlled at last office visit)  - PRN labetalol ordered by ED to be given   T2DM:  A1c one year ago 7.6. Home meds Jardiance , Ozempic  1 mg.  - recheck A1c  - ordering moderate SSI - no anion gap, or symptoms of DKA/HHS   Full H&P per day team.   Cleotilde Perkins, DO 11/12/2023, 5:55 AM PGY-2, [facility] Family Medicine Service pager [phone]

## 2023-11-12 NOTE — ED Notes (Signed)
 Was given graham crackers, peanut butter and cranberry juice with cup ice.

## 2023-11-12 NOTE — Assessment & Plan Note (Signed)
 Infection of right second digit in setting of uncontrolled diabetes.  Hand surgery on board, planning for debridement and exploration.  X-ray without evidence of osteo, MRI pending. S/p 1 dose Unasyn and Vanc.  - Admit to FMTS, attending Dr. Rumball - Follow-up MRI - Follow-up hand surgery recs, plan for debridement this afternoon - Re-dosing abx after surgery  - She is not in significant pain, will hold off on Tylenol as I do not want to mask a fever

## 2023-11-12 NOTE — H&P (Addendum)
 Hospital Admission History and Physical Service Pager: [phone]  Patient name: Sally Mclaughlin Medical record number: [id number] Date of Birth: 1966/01/09 Age: 58 y.o. Gender: female  Primary Care Provider: Joshua Domino, DO Consultants: Hand surgery Code Status: Full Preferred Emergency Contact:  Contact Information     Name Relation Home Work Gates Daughter [phone]  [phone]   Baruch Lamar Rom   [phone]      Other Contacts     Name Relation Home Work Mobile   Agricola Mother [phone]  [phone]   Nadelyn, Enriques   [phone]        Chief Complaint: Swollen finger  Assessment and Plan: Sally Mclaughlin is a 58 y.o. female presenting with infected right second digit appearing swollen with desquamation. Differential for presentation of this includes soft tissue infection versus osteomyelitis.  Low concern for systemic illness given no systemic symptoms, no leukocytosis, vitals stable other than blood pressure. LA elevated to 2.3 could be related to uncontrolled HTN. Assessment & Plan Finger infection Infection of right second digit in setting of uncontrolled diabetes.  Hand surgery on board, planning for debridement and exploration.  X-ray without evidence of osteo, MRI pending. S/p 1 dose Unasyn and Vanc.  - Admit to FMTS, attending Dr. Rumball - Follow-up MRI - Follow-up hand surgery recs, plan for debridement this afternoon - Re-dosing abx after surgery  - She is not in significant pain, will hold off on Tylenol as I do not want to mask a fever T2DM (type 2 diabetes mellitus) (HCC) Uncontrolled in setting of not having home insulin .  Last A1c 10/19/2022 of 7.6.  Tight sugar control be important in setting of finger infection.  Blood sugar decreased from 500s to 300s after 15 units NovoLog . AG 12, BHB 0.08.  Has symptoms of neuropathy in extremities but not bothersome to her, no need for medications at this  time. - Follow-up A1c - Semglee  5 units for 1 dose, reorder Semglee  tomorrow - mSSI - restart lipitor 80 mg HTN (hypertension) Significantly elevated in setting of not having medications for last few months.  No signs of endorgan damage. s/p IV labetalol in ED. - Restart home HCTZ 25 mg daily and amlodipine  5 mg daily, adjust as needed  High serum lactate LA 2.3 in setting of HTN, hyperglycemia and infection. Low concern for sepsis.  -f/u repeat LA, if no improvement, can consider IV fluids   Chronic and Stable Problems:  Heart murmur: Soft systolic murmur on auscultation.  Had echo 12/29/2022 to evaluate murmur showing EF 55-60% with normal LV function and no regional wall abnormalities and G1 DD she is asymptomatic.  No need for further evaluation. HLD- restart Lipitor   FEN/GI: N.p.o. in preparation for hand surgery VTE Prophylaxis: Lovenox  Disposition: Pending continued medical management  History of Present Illness:  Sally Mclaughlin is a 58 y.o. female presenting with swollen and weird looking finger for past couple weeks prompting her to go to urgent care on 11/11/2023 who sent her to the ED. Finger is not very painful as she has decreased sensation.  She has no known injury.  She denies any fevers, shortness of breath, lightheadedness, vision changes, chest pain, nausea, vomiting, diarrhea and states she is otherwise feeling like her normal self.  She does admit to tingling or feeling like there is sand between her toes of bilateral feet but states this is not bothering her.  She has been out of her blood pressure and diabetes  medications for the last couple months.  She states she just forgets to take them and has been very busy working.  In the ED, vitals notable for elevated BP as high as 234/96, vitals otherwise stable. Labs showing no leukocytosis or anemia, BMP with glucose 517 with AG 12, LA 2.3 and blood cultures drawn. She was treated with 15 units NovoLog , labetalol 20 mg  IV, 1 dose of Unasyn and vancomycin.   Hand surgery consulted recommending debridement and exploration.   Inpatient team and then ordered moderate sliding scale insulin , home hydrochlorothiazide  25 mg and increased dose of amlodipine  to 10 mg  Review Of Systems: Per HPI   Pertinent Past Medical History: T2DM with neuropathy HLD HTN Tobacco use, only 1 to 2 cigarettes a day History of alcohol use, has not drank in 4 years Remainder reviewed in history tab.   Pertinent Past Surgical History: None pertinent Remainder reviewed in history tab.  Pertinent Social History: Tobacco use: Yes, 1-2 cigarettes per day, declines nicotine patch  Alcohol use: denies alcohol use in past 4 years Other Substance use: No Lives alone  Pertinent Family History: None pertinent Remainder reviewed in history tab.   Important Outpatient Medications: HCTZ-not taking Amlodipine -not taking Lantus -not taking Remainder reviewed in medication history.   Objective: BP (!) 157/71   Pulse 74   Temp 98.4 F (36.9 C) (Oral)   Resp 16   Ht 5' 6 (1.676 m)   Wt 72.6 kg   LMP 11/25/2012   SpO2 100%   BMI 25.82 kg/m  Exam: General: 58 year old female, well-appearing Eyes: White sclera, clear conjunctiva, EOEMI, pupils equal ENTM: MMM Neck: Supple Cardiovascular: RRR, soft systolic murmur Respiratory: CTAB, normal effort Gastrointestinal: Bowel sounds present, soft, nontender palpation, nondistended Extremities: Second right digit swollen with desquamation with decrease in sensation, good motion with flexion extension. Dorsalis pedis pulses palpable bilaterally, no deformities or wounds of feet or toes, thickened hyperkeratotic toenails Derm: No rashes, warm and dry Neuro: Alert, cranial nerves grossly intact, moving all extremities equally Psych: Mood and affect appropriate for situation  Labs:  CBC BMET  Recent Labs  Lab 11/12/23 0200 11/12/23 0529  WBC 5.5  --   HGB 12.4 11.6*  HCT 36.1  34.0*  PLT 262  --    Recent Labs  Lab 11/12/23 0400 11/12/23 0529  NA 132* 134*  K 3.9 3.7  CL 97* 96*  CO2 23  --   BUN 13 14  CREATININE 1.00 0.90  GLUCOSE 517* 513*  CALCIUM  9.0  --        Joshua Domino, DO 11/12/2023, 9:46 AM PGY-3, [facility]  FPTS Intern pager: [phone], text pages welcome Secure chat group [facility] Teaching Service

## 2023-11-12 NOTE — Progress Notes (Signed)
 ED Pharmacy Antibiotic Sign Off An antibiotic consult was received from an ED provider for Vancomycin/Unasyn per pharmacy dosing for wound infection. A chart review was completed to assess appropriateness.   The following one time order(s) were placed:  Vancomycin 1500 mg IV x 1 Unasyn 3g IV x 1  Further antibiotic and/or antibiotic pharmacy consults should be ordered by the admitting provider if indicated.   Lynwood Mckusick, PharmD, BCPS Clinical Pharmacist Phone: [phone]

## 2023-11-12 NOTE — Op Note (Signed)
 NAME: Sally Mclaughlin MEDICAL RECORD NO: [id number] DATE OF BIRTH: Mar 18, 1966 FACILITY: [facility] LOCATION: [facility] OR PHYSICIAN: Caidyn Blossom R. Berneice Zettlemoyer, MD   OPERATIVE REPORT   DATE OF PROCEDURE: 11/12/23    PREOPERATIVE DIAGNOSIS: Right index finger infection with osteomyelitis   POSTOPERATIVE DIAGNOSIS: Right index finger infection with osteomyelitis   PROCEDURE: Incision and drainage right index finger including bone cortex   SURGEON:  Franky Curia, M.D.   ASSISTANT: none   ANESTHESIA:  General   INTRAVENOUS FLUIDS:  Per anesthesia flow sheet.   ESTIMATED BLOOD LOSS:  Minimal.   COMPLICATIONS:  None.   SPECIMENS: Cultures to micro including bone   TOURNIQUET TIME:    Total Tourniquet Time Documented: Upper Arm (Right) - 19 minutes Total: Upper Arm (Right) - 19 minutes    DISPOSITION:  Stable to PACU.   INDICATIONS: 58 year old female with right index finger increasing swelling and pain.  She has lost the skin on the end of the finger.  MRI consistent with osteomyelitis.  I recommended incision and drainage in the operating room including bone cultures.  Risks, benefits and alternatives of surgery were discussed including the risks of blood loss, infection, damage to nerves, vessels, tendons, ligaments, bone for surgery, need for additional surgery, complications with wound healing, continued pain, stiffness, , need for repeat irrigation and debridement.  She voiced understanding of these risks and elected to proceed.  OPERATIVE COURSE:  After being identified preoperatively by myself,  the patient and I agreed on the procedure and site of the procedure.  The surgical site was marked.  Surgical consent had been signed.  She is on scheduled antibiotics.  She was transferred to the operating room and placed on the operating table in supine position with the Right upper extremity on an arm board.  General anesthesia was induced by the anesthesiologist.  Right upper extremity was prepped  and draped in normal sterile orthopedic fashion.  A surgical pause was performed between the surgeons, anesthesia, and operating room staff and all were in agreement as to the patient, procedure, and site of procedure.  Tourniquet at the proximal aspect of the extremity was inflated to 250 mmHg after exsanguination of the arm with an Esmarch bandage.  The outer layers of skin had delaminated and were sharply removed with the scissors.  The skin underneath was intact but soft.  Some of the dermis had softened to the point that would scrape off.  Incision was made at the ulnar side of the index finger distal phalanx.  This was carried in subcutaneous tissues by spreading technique.  The septae in the pad of the finger were spread.  No gross purulence was encountered.  Cultures were taken for aerobes and anaerobes.  The fluctuant skin on the dorsum of the finger was also removed proximal of the nail.  There is no purulence.  A curette was used to open the bone cortex of the distal phalanx and remove bone which was sent for culture.  The wound was copiously irrigated with sterile saline.  Was packed with quarter inch iodoform gauze.  The wound was dressed with sterile Xeroform 4 x 4 and wrapped with a Coban dressing lightly.  AlumaFoam splint was placed and wrapped lightly with Coban dressing.  A digital block of been performed with quarter percent plain Marcaine to aid in postoperative analgesia.  The tourniquet was deflated at 19 minutes.  Fingertips were pink with brisk capillary refill after deflation of tourniquet.  The operative  drapes were  broken down.  The patient was awoken from anesthesia safely.  She was transferred back to the stretcher and taken to PACU in stable condition.  She is admitted to hospitalist for IV antibiotics.   Charlane Westry, MD Electronically signed, 11/12/23

## 2023-11-12 NOTE — Progress Notes (Signed)
 Postop note: Status post incision and drainage right index finger including bone cultures.  Start hydrotherapy in 2 to 4 days.  Okay for DVT prophylaxis.  Recommend infectious disease consult for assistance in antibiotic selection for osteomyelitis.  Okay for discharge home when afebrile, white blood count trending to normal, pain controlled.

## 2023-11-12 NOTE — Anesthesia Postprocedure Evaluation (Signed)
 Anesthesia Post Note  Patient: Sally Mclaughlin  Procedure(s) Performed: INCISION AND DRAINAGE OF DEEP ABSCESS, HAND (Right: Hand)     Patient location during evaluation: PACU Anesthesia Type: General Level of consciousness: awake and alert Pain management: pain level controlled Vital Signs Assessment: post-procedure vital signs reviewed and stable Respiratory status: spontaneous breathing, nonlabored ventilation and respiratory function stable Cardiovascular status: blood pressure returned to baseline and stable Postop Assessment: no apparent nausea or vomiting Anesthetic complications: no   No notable events documented.  Last Vitals:  Vitals:   11/12/23 1900 11/12/23 1915  BP: 127/69 (!) 158/64  Pulse: 61 63  Resp: 16 11  Temp:    SpO2: 96% 96%    Last Pain:  Vitals:   11/12/23 1915  TempSrc:   PainSc: 0-No pain                 Cassundra Mckeever

## 2023-11-13 ENCOUNTER — Encounter (HOSPITAL_COMMUNITY): Payer: Self-pay | Admitting: Orthopedic Surgery

## 2023-11-13 DIAGNOSIS — I10 Essential (primary) hypertension: Secondary | ICD-10-CM | POA: Diagnosis not present

## 2023-11-13 DIAGNOSIS — E1140 Type 2 diabetes mellitus with diabetic neuropathy, unspecified: Secondary | ICD-10-CM | POA: Diagnosis not present

## 2023-11-13 DIAGNOSIS — M869 Osteomyelitis, unspecified: Secondary | ICD-10-CM | POA: Diagnosis not present

## 2023-11-13 DIAGNOSIS — B353 Tinea pedis: Secondary | ICD-10-CM | POA: Insufficient documentation

## 2023-11-13 LAB — BASIC METABOLIC PANEL WITH GFR
Anion gap: 13 (ref 5–15)
BUN: 22 mg/dL — ABNORMAL HIGH (ref 6–20)
CO2: 23 mmol/L (ref 22–32)
Calcium: 8.7 mg/dL — ABNORMAL LOW (ref 8.9–10.3)
Chloride: 96 mmol/L — ABNORMAL LOW (ref 98–111)
Creatinine, Ser: 1.37 mg/dL — ABNORMAL HIGH (ref 0.44–1.00)
GFR, Estimated: 44 mL/min — ABNORMAL LOW (ref >60)
Glucose, Bld: 804 mg/dL (ref 70–99)
Potassium: 4.4 mmol/L (ref 3.5–5.1)
Sodium: 132 mmol/L — ABNORMAL LOW (ref 135–145)

## 2023-11-13 LAB — GLUCOSE, CAPILLARY
Glucose-Capillary: >600 mg/dL (ref 70–99)
Glucose-Capillary: 477 mg/dL — ABNORMAL HIGH (ref 70–99)
Glucose-Capillary: 539 mg/dL (ref 70–99)
Glucose-Capillary: 300 mg/dL — ABNORMAL HIGH (ref 70–99)
Glucose-Capillary: 236 mg/dL — ABNORMAL HIGH (ref 70–99)
Glucose-Capillary: 280 mg/dL — ABNORMAL HIGH (ref 70–99)

## 2023-11-13 LAB — MAGNESIUM: Magnesium: 1.6 mg/dL — ABNORMAL LOW (ref 1.7–2.4)

## 2023-11-13 LAB — CBC WITH DIFFERENTIAL/PLATELET
Abs Immature Granulocytes: 0.03 10*3/uL (ref 0.00–0.07)
Basophils Absolute: 0 10*3/uL (ref 0.0–0.1)
Basophils Relative: 0 %
Eosinophils Absolute: 0 10*3/uL (ref 0.0–0.5)
Eosinophils Relative: 0 %
HCT: 34 % — ABNORMAL LOW (ref 36.0–46.0)
Hemoglobin: 11.8 g/dL — ABNORMAL LOW (ref 12.0–15.0)
Immature Granulocytes: 1 %
Lymphocytes Relative: 8 %
Lymphs Abs: 0.5 10*3/uL — ABNORMAL LOW (ref 0.7–4.0)
MCH: 31 pg (ref 26.0–34.0)
MCHC: 34.7 g/dL (ref 30.0–36.0)
MCV: 89.2 fL (ref 80.0–100.0)
Monocytes Absolute: 0.1 10*3/uL (ref 0.1–1.0)
Monocytes Relative: 1 %
Neutro Abs: 5.7 10*3/uL (ref 1.7–7.7)
Neutrophils Relative %: 90 %
Platelets: 260 10*3/uL (ref 150–400)
RBC: 3.81 MIL/uL — ABNORMAL LOW (ref 3.87–5.11)
RDW: 12.4 % (ref 11.5–15.5)
WBC: 6.4 10*3/uL (ref 4.0–10.5)
nRBC: 0 % (ref 0.0–0.2)

## 2023-11-13 MED ORDER — INSULIN ASPART 100 UNIT/ML IJ SOLN
20.0000 [IU] | Freq: Once | INTRAMUSCULAR | Status: AC
  Administered 2023-11-13: 20 [IU] via SUBCUTANEOUS

## 2023-11-13 MED ORDER — SODIUM CHLORIDE 0.9% FLUSH
10.0000 mL | Freq: Two times a day (BID) | INTRAVENOUS | Status: DC
  Administered 2023-11-13 – 2023-11-18 (×10): 10 mL

## 2023-11-13 MED ORDER — MAGNESIUM SULFATE 2 GM/50ML IV SOLN
2.0000 g | Freq: Once | INTRAVENOUS | Status: AC
  Administered 2023-11-13: 2 g via INTRAVENOUS
  Filled 2023-11-13: qty 50

## 2023-11-13 MED ORDER — INSULIN GLARGINE-YFGN 100 UNIT/ML ~~LOC~~ SOLN
10.0000 [IU] | Freq: Every day | SUBCUTANEOUS | Status: DC
  Filled 2023-11-13: qty 0.1

## 2023-11-13 MED ORDER — SODIUM CHLORIDE 0.9% FLUSH: 10.0000 mL | INTRAVENOUS | Status: DC | PRN

## 2023-11-13 MED ORDER — IRBESARTAN 150 MG PO TABS
75.0000 mg | ORAL_TABLET | Freq: Every day | ORAL | Status: DC
  Administered 2023-11-13 – 2023-11-14 (×2): 75 mg via ORAL
  Filled 2023-11-13 (×2): qty 1

## 2023-11-13 MED ORDER — ENOXAPARIN SODIUM 40 MG/0.4ML IJ SOSY
40.0000 mg | PREFILLED_SYRINGE | INTRAMUSCULAR | Status: DC
  Administered 2023-11-13 – 2023-11-18 (×6): 40 mg via SUBCUTANEOUS
  Filled 2023-11-13 (×6): qty 0.4

## 2023-11-13 MED ORDER — INSULIN GLARGINE-YFGN 100 UNIT/ML ~~LOC~~ SOLN
10.0000 [IU] | Freq: Once | SUBCUTANEOUS | Status: AC
  Administered 2023-11-13: 10 [IU] via SUBCUTANEOUS
  Filled 2023-11-13: qty 0.1

## 2023-11-13 MED ORDER — VANCOMYCIN HCL IN DEXTROSE 1-5 GM/200ML-% IV SOLN
1000.0000 mg | INTRAVENOUS | Status: DC
  Administered 2023-11-13: 1000 mg via INTRAVENOUS
  Filled 2023-11-13: qty 200

## 2023-11-13 NOTE — Plan of Care (Signed)

## 2023-11-13 NOTE — Assessment & Plan Note (Signed)
 Of the left great toe - Continue topical clotrimazole

## 2023-11-13 NOTE — Assessment & Plan Note (Addendum)
 Status post debridement exploration yesterday after MRI confirmed findings of cellulitis and osteomyelitis in R index finger. -Hand surgery following, appreciate recommendations - Start hydrotherapy in 2 to 4 days -Status post dose of Unasyn and Vanco, will redose Unasyn today

## 2023-11-13 NOTE — Progress Notes (Signed)
     Daily Progress Note Intern Pager: [phone]  Patient name: Sally Mclaughlin Medical record number: [id number] Date of birth: [birth year] Age: 58 y.o. Gender: female  Primary Care Provider: Joshua Domino, DO Consultants: Hand surgery Code Status: Full  Pt Overview and Major Events to Date:  6/27-admitted, taken to the OR by hand surgery for washout  Assessment and Plan:  Patient is a 58 year old female here with cellulitis and osteomyelitis of the right second digit. Pertinent PMH/PSH includes T2DM, hypertension, HLD.  Assessment & Plan Osteomyelitis of finger (HCC) Status post debridement exploration yesterday after MRI confirmed findings of cellulitis and osteomyelitis in R index finger. -Hand surgery following, appreciate recommendations - Start hydrotherapy in 2 to 4 days -Status post dose of Unasyn and Vanco, will redose Unasyn today T2DM (type 2 diabetes mellitus) (HCC) A1c pending.  Blood sugar on admission in the 500s.  Sugars have remained between 140-212 since surgery with sliding scale insulin  alone. -Continue moderate sliding scale insulin  -Lipitor 80 mg daily HTN (hypertension) Improved since admission. -Continue home HCTZ 25 mg daily and amlodipine  5 mg daily High serum lactate Repeat lactic acid unfortunately not resulted from initial value of 2.3.  Low concern for sepsis.  Continue antibiotics as above. -Continue to monitor clinically Tinea pedis Of the left great toe - Continue topical clotrimazole  FEN/GI: Carb modified/heart healthy diet PPx: Lovenox Dispo:Pending PT recommendations  pending clinical improvement .  Subjective:  No concerns this AM. No pain. Has been drinking well and mobilizing.  Objective: Temp:  [97.4 F (36.3 C)-98.6 F (37 C)] 98.4 F (36.9 C) (06/27 2357) Pulse Rate:  [60-81] 81 (06/27 2357) Resp:  [11-18] 18 (06/27 2357) BP: (123-234)/(56-96) 164/79 (06/27 2357) SpO2:  [96 %-100 %] 100 % (06/27 2357) Weight:  [72.6 kg]  72.6 kg (06/27 1130) Physical Exam: General: Lying in bed, alert, NAD Cardiovascular: RRR, blowing systolic murmur 3/6 Respiratory: CTAB anteriorly, no WOB Abdomen: Soft, nondistended, nontender, normoactive bowel sounds Extremities: R index finger wrapped, no discharge/bleeding through bandage, patient moving all extremities grossly equally  Laboratory: Most recent CBC Lab Results  Component Value Date   WBC 5.5 11/12/2023   HGB 11.6 (L) 11/12/2023   HCT 34.0 (L) 11/12/2023   MCV 87.0 11/12/2023   PLT 262 11/12/2023   Most recent BMP    Latest Ref Rng & Units 11/12/2023    5:29 AM  BMP  Glucose 70 - 99 mg/dL 486   BUN 6 - 20 mg/dL 14   Creatinine 9.55 - 1.00 mg/dL 9.09   Sodium 864 - 854 mmol/L 134   Potassium 3.5 - 5.1 mmol/L 3.7   Chloride 98 - 111 mmol/L 96    Imaging/Diagnostic Tests:  MRI right fingers IMPRESSION: 1. Abnormal edema and enhancement in the distal phalanx of the index finger and distally in the middle phalanx, suspicious for osteomyelitis. 2. Subcutaneous edema and enhancement throughout the left index finger but especially distally, suspicious for cellulitis. No drainable abscess. 3. Small degenerative subcortical cyst along the head of the fifth metacarpal, with similar lesion or small erosion along the radial side of the base of the proximal phalanx small finger.  Tharon Lung, MD 11/13/2023, 1:19 AM  PGY-2, [facility] FPTS Intern pager: [phone], text pages welcome Secure chat group [facility] Teaching Service

## 2023-11-13 NOTE — Assessment & Plan Note (Signed)
 Repeat lactic acid unfortunately not resulted from initial value of 2.3.  Low concern for sepsis.  Continue antibiotics as above. -Continue to monitor clinically

## 2023-11-13 NOTE — Progress Notes (Signed)

## 2023-11-13 NOTE — Plan of Care (Signed)
  Problem: Coping: Goal: Ability to adjust to condition or change in health will improve Outcome: Progressing   Problem: Nutritional: Goal: Maintenance of adequate nutrition will improve Outcome: Progressing   Problem: Activity: Goal: Risk for activity intolerance will decrease Outcome: Progressing

## 2023-11-13 NOTE — Assessment & Plan Note (Signed)
 Improved since admission. -Continue home HCTZ 25 mg daily and amlodipine  5 mg daily

## 2023-11-13 NOTE — Progress Notes (Signed)
 Pharmacy Antibiotic Note  Sally Mclaughlin is a 58 y.o. female admitted on 11/11/2023 with osteomyelitis.  Pharmacy has been consulted for vanc dosing.  Pt presented with cellulitis and osteo of finger. S/p I&D. Vanc ordered to continue pending culture.   Scr 1.37  Plan: Vanc 1g IV q24>>AUC 496, scr 1.37  Height: 5' 6 (167.6 cm) Weight: 72.6 kg (160 lb 0.9 oz) IBW/kg (Calculated) : 59.3  Temp (24hrs), Avg:97.9 F (36.6 C), Min:97.4 F (36.3 C), Max:98.4 F (36.9 C)  Recent Labs  Lab 11/12/23 0200 11/12/23 0400 11/12/23 0524 11/12/23 0529 11/13/23 0440  WBC 5.5  --   --   --  6.4  CREATININE  --  1.00  --  0.90 1.37*  LATICACIDVEN  --   --  2.3*  --   --     Estimated Creatinine Clearance: 44.5 mL/min (A) (by C-G formula based on SCr of 1.37 mg/dL (H)).    No Known Allergies  Antimicrobials this admission: 6/27 Unasyn>>6/28 6/27 vanc>>  Dose adjustments this admission:   Microbiology results: 6/27 BCx: ngtd 6/27 wound>> 6/27 bone>>ngtd  Sergio Batch, PharmD, [place], AAHIVP, CPP Infectious Disease Pharmacist 11/13/2023 12:07 PM

## 2023-11-13 NOTE — Assessment & Plan Note (Signed)
 A1c pending.  Blood sugar on admission in the 500s.  Sugars have remained between 140-212 since surgery with sliding scale insulin  alone. -Continue moderate sliding scale insulin  -Lipitor 80 mg daily

## 2023-11-14 DIAGNOSIS — I10 Essential (primary) hypertension: Secondary | ICD-10-CM | POA: Diagnosis not present

## 2023-11-14 DIAGNOSIS — E1140 Type 2 diabetes mellitus with diabetic neuropathy, unspecified: Secondary | ICD-10-CM | POA: Diagnosis not present

## 2023-11-14 DIAGNOSIS — M869 Osteomyelitis, unspecified: Secondary | ICD-10-CM | POA: Diagnosis not present

## 2023-11-14 LAB — MAGNESIUM: Magnesium: 2 mg/dL (ref 1.7–2.4)

## 2023-11-14 LAB — BASIC METABOLIC PANEL WITH GFR
Anion gap: 7 (ref 5–15)
BUN: 28 mg/dL — ABNORMAL HIGH (ref 6–20)
CO2: 24 mmol/L (ref 22–32)
Calcium: 8.5 mg/dL — ABNORMAL LOW (ref 8.9–10.3)
Chloride: 99 mmol/L (ref 98–111)
Creatinine, Ser: 1.02 mg/dL — ABNORMAL HIGH (ref 0.44–1.00)
GFR, Estimated: >60 mL/min (ref >60)
Glucose, Bld: 328 mg/dL — ABNORMAL HIGH (ref 70–99)
Potassium: 4.4 mmol/L (ref 3.5–5.1)
Sodium: 130 mmol/L — ABNORMAL LOW (ref 135–145)

## 2023-11-14 LAB — GLUCOSE, CAPILLARY
Glucose-Capillary: 342 mg/dL — ABNORMAL HIGH (ref 70–99)
Glucose-Capillary: 240 mg/dL — ABNORMAL HIGH (ref 70–99)
Glucose-Capillary: 317 mg/dL — ABNORMAL HIGH (ref 70–99)
Glucose-Capillary: 233 mg/dL — ABNORMAL HIGH (ref 70–99)

## 2023-11-14 MED ORDER — VANCOMYCIN HCL 1250 MG/250ML IV SOLN
1250.0000 mg | INTRAVENOUS | Status: DC
  Administered 2023-11-14 – 2023-11-15 (×2): 1250 mg via INTRAVENOUS
  Filled 2023-11-14 (×3): qty 250

## 2023-11-14 MED ORDER — INSULIN GLARGINE-YFGN 100 UNIT/ML ~~LOC~~ SOLN: 20.0000 [IU] | Freq: Every day | SUBCUTANEOUS | Status: DC

## 2023-11-14 MED ORDER — IRBESARTAN 300 MG PO TABS
300.0000 mg | ORAL_TABLET | Freq: Every day | ORAL | Status: DC
  Administered 2023-11-15 – 2023-11-18 (×4): 300 mg via ORAL
  Filled 2023-11-14 (×4): qty 1

## 2023-11-14 MED ORDER — INSULIN GLARGINE-YFGN 100 UNIT/ML ~~LOC~~ SOLN
15.0000 [IU] | Freq: Every day | SUBCUTANEOUS | Status: DC
  Administered 2023-11-14: 15 [IU] via SUBCUTANEOUS
  Filled 2023-11-14 (×3): qty 0.15

## 2023-11-14 NOTE — Assessment & Plan Note (Addendum)
 BP elevated this morning to 150-170/70-80.  - Increased Irbesartan 75 mg to 300 mg daily starting tomorrow - Can consider a combo med at discharge (Losartan/HCTZ) - Continue home HCTZ 25 mg daily and amlodipine  5 mg daily

## 2023-11-14 NOTE — Progress Notes (Signed)
     Daily Progress Note Intern Pager: [phone]  Patient name: Sally Mclaughlin Medical record number: [id number] Date of birth: 19-Jul-1965 Age: 58 y.o. Gender: female  Primary Care Provider: Joshua Domino, DO Consultants: Hand Surgery Code Status: Full Code  Pt Overview and Major Events to Date:  6/27: Admitted; I&D per Hand Surgery  Assessment and Plan: Sally Mclaughlin is a 58 y.o. female with PMH of T2DM, HTN, and HLD here with cellulitis and osteomyelitis of the right second digit, now s/p I&D. Assessment & Plan Osteomyelitis of finger (HCC) S/p debridement exploration 6/27 after MRI confirmed findings of cellulitis and osteomyelitis of R index finger.  Antibiotic treatment for 6 weeks (6/27-8/8), based off cultures. - Hand surgery following, appreciate recommendations - Hydrotherapy per PT - Follow surgical wound cultures - Continue vancomycin 1g daily, could transition to PO as able per cultures - AM CBC, BMP, Mag T2DM (type 2 diabetes mellitus) (HCC) A1c pending.  Sugars have been difficult to control while inpatient, CBG 230s-340s. - Increase Semglee  to 15u daily - Moderate SSI ACHS - Lipitor 80 mg daily HTN (hypertension) BP elevated this morning to 150-170/70-80.  - Increased Irbesartan 75 mg to 300 mg daily starting tomorrow - Can consider a combo med at discharge (Losartan/HCTZ) - Continue home HCTZ 25 mg daily and amlodipine  5 mg daily High serum lactate Repeat lactic acid unfortunately not resulted from initial value of 2.3.  Low concern for sepsis.  Continue antibiotics as above. - Continue to monitor clinically  FEN/GI: Carb modified PPx: Lovenox Dispo:Pending clinical improvement and cultures to establish antibiotic course.   Subjective:  Patient is doing well this morning, was deciding what to get for breakfast with her sugars being high. No other concerns this morning.   Objective: Temp:  [97.8 F (36.6 C)-98.1 F (36.7 C)] 98 F (36.7 C) (06/29  0725) Pulse Rate:  [66-83] 66 (06/29 0725) Resp:  [16-18] 16 (06/29 0725) BP: (128-178)/(66-80) 150/74 (06/29 0725) SpO2:  [97 %-100 %] 97 % (06/29 0725) Physical Exam: General: Awake and Alert in NAD HEENT: NCAT. Sclera anicteric. No rhinorrhea. Cardiovascular: RRR. 3/6 whooshing systolic murmur Respiratory: CTAB, normal WOB on RA. No wheezing, crackles, rhonchi, or diminished breath sounds. Abdomen: Soft, non-tender, non-distended. Bowel sounds normoactive Extremities: R index finger wrapped, C/D/I. Patient able to move all extremities. No BLE edema.  Skin: Warm and dry. No abrasions or rashes noted. Neuro: A&Ox3. No focal neurological deficits.  Laboratory: Most recent CBC Lab Results  Component Value Date   WBC 6.4 11/13/2023   HGB 11.8 (L) 11/13/2023   HCT 34.0 (L) 11/13/2023   MCV 89.2 11/13/2023   PLT 260 11/13/2023   Most recent BMP    Latest Ref Rng & Units 11/14/2023    4:53 AM  BMP  Glucose 70 - 99 mg/dL 671   BUN 6 - 20 mg/dL 28   Creatinine 9.55 - 1.00 mg/dL 8.97   Sodium 864 - 854 mmol/L 130   Potassium 3.5 - 5.1 mmol/L 4.4   Chloride 98 - 111 mmol/L 99   CO2 22 - 32 mmol/L 24   Calcium  8.9 - 10.3 mg/dL 8.5    Mag: 2.2  Imaging/Diagnostic Tests: No new imaging.  Janna Ferrier, DO 11/14/2023, 11:25 AM  PGY-1, [facility] FPTS Intern pager: [phone], text pages welcome Secure chat group [facility] Teaching Service

## 2023-11-14 NOTE — Progress Notes (Signed)
 Pharmacy Antibiotic Note  Sally Mclaughlin is a 59 y.o. female admitted on 11/11/2023 with osteomyelitis.  Pharmacy has been consulted for vanc dosing.  Pt presented with cellulitis and osteo of finger. S/p I&D. Vanc ordered to continue pending culture. Scr down to 1 today so we will adjust dose.   Plan: Vanc 1.25g IV q24>>AUC 467, scr 1  Height: 5' 6 (167.6 cm) Weight: 72.6 kg (160 lb 0.9 oz) IBW/kg (Calculated) : 59.3  Temp (24hrs), Avg:98 F (36.7 C), Min:97.8 F (36.6 C), Max:98.1 F (36.7 C)  Recent Labs  Lab 11/12/23 0200 11/12/23 0400 11/12/23 0524 11/12/23 0529 11/13/23 0440 11/14/23 0453  WBC 5.5  --   --   --  6.4  --   CREATININE  --  1.00  --  0.90 1.37* 1.02*  LATICACIDVEN  --   --  2.3*  --   --   --     Estimated Creatinine Clearance: 59.8 mL/min (A) (by C-G formula based on SCr of 1.02 mg/dL (H)).    No Known Allergies  Antimicrobials this admission: 6/27 Unasyn>>6/28 6/27 vanc>>  Dose adjustments this admission:   Microbiology results: 6/27 BCx: ngtd 6/27 wound>>ngtd 6/27 bone>>ngtd  Sergio Batch, PharmD, BCIDP, AAHIVP, CPP Infectious Disease Pharmacist 11/14/2023 9:42 AM

## 2023-11-14 NOTE — Assessment & Plan Note (Signed)
 Repeat lactic acid unfortunately not resulted from initial value of 2.3.  Low concern for sepsis.  Continue antibiotics as above. - Continue to monitor clinically

## 2023-11-14 NOTE — Progress Notes (Signed)
 Physical Therapy Wound Treatment Patient Details  Name: Sally Mclaughlin MRN: [id number] Date of Birth: November 30, 1965  Today's Date: 11/14/2023 Time: 8599-8562 Time Calculation (min): 37 min  Subjective  Subjective Assessment Subjective: Pt pleasant and agreeable to wound therapy session. Patient and Family Stated Goals: Return to work Date of Onset: 10/28/23 (Approximately) Prior Treatments: I&D per Dr. Murrell 6/27  Pain Score:  faces pain scale 0/10 - premedicated  Wound Assessment  Wound 11/14/23 1455 Surgical Open Surgical Incision Finger (Comment which one) Anterior;Right (Active)  Wound Image    11/14/23 1453  Site / Wound Assessment Pink 11/14/23 1453  Peri-wound Assessment Intact 11/14/23 1453  Wound Length (cm) 0.3 cm 11/14/23 1453  Wound Width (cm) 0.1 cm 11/14/23 1453  Wound Surface Area (cm^2) 0.02 cm^2 11/14/23 1453  Wound Depth (cm) 0.8 cm 11/14/23 1453  Wound Volume (cm^3) 0.013 cm^3 11/14/23 1453  Drainage Description Serosanguineous 11/14/23 1453  Drainage Amount Scant 11/14/23 1453  Treatment Irrigation;Packing (Saline gauze) 11/14/23 1453  Dressing Type Gauze (Comment);Normal saline moist dressing;Non adherent;Moist to moist 11/14/23 1453  Dressing Changed Changed 11/14/23 1453  Dressing Status Clean, Dry, Intact 11/14/23 1453  % Wound base Red or Granulating 100% 11/14/23 1453  % Wound base Yellow/Fibrinous Exudate 0% 11/14/23 1453  % Wound base Black/Eschar 0% 11/14/23 1453  % Wound base Other/Granulation Tissue (Comment) 0% 11/14/23 1453  Tunneling (cm) 0 11/14/23 1453  Undermining (cm) 0 11/14/23 1453  Margins Unattached edges (unapproximated) 11/14/23 1453        Wound Assessment and Plan  Wound Therapy - Assess/Plan/Recommendations Wound Therapy - Clinical Statement: Pt presents to wound therapy s/p I&D of R index finger. Pt premedicated for pain and did not complain of pain throughout session. Of note, no packing strips present on unit, so wound was  packed with a normal saline dressing and unit was asked to order 1/4 iodoform packing strips for next dressing change. RN notified. Overall wound appears clean. Will see again tomorrow and assess need for continued wound therapy intervention. Wound Therapy - Functional Problem List: Acute pain Factors Delaying/Impairing Wound Healing: Diabetes Mellitus Hydrotherapy Plan: Dressing change, Patient/family education Wound Therapy - Frequency: 5X / week Wound Therapy - Follow Up Recommendations: dressing changes by family/patient  Wound Therapy Goals- Improve the function of patient's integumentary system by progressing the wound(s) through the phases of wound healing (inflammation - proliferation - remodeling) by: Wound Therapy Goals - Improve the function of patient's integumentary system by progressing the wound(s) through the phases of wound healing by: Improve Drainage Characteristics: Min, Serous Improve Drainage Characteristics - Progress: Goal set today Patient/Family will be able to : demonstrate proper dressing changes or be able to instruct caregiver in dressing changes without assist or cues. Patient/Family Instruction Goal - Progress: Goal set today Goals/treatment plan/discharge plan were made with and agreed upon by patient/family: Yes Time For Goal Achievement: 7 days Wound Therapy - Potential for Goals: Excellent  Goals will be updated until maximal potential achieved or discharge criteria met.  Discharge criteria: when goals achieved, discharge from hospital, MD decision/surgical intervention, no progress towards goals, refusal/missing three consecutive treatments without notification or medical reason.  GP     Charges PT Wound Care Charges $PT Hydrotherapy Dressing: 1 dressing $PT Hydrotherapy Visit: 1 Visit       Leita JONETTA Sable 11/14/2023, 3:03 PM  Leita Sable, PT, DPT Acute Rehabilitation Services Secure Chat Preferred Office: [phone]

## 2023-11-14 NOTE — Assessment & Plan Note (Addendum)
 S/p debridement exploration 6/27 after MRI confirmed findings of cellulitis and osteomyelitis of R index finger.  Antibiotic treatment for 6 weeks (6/27-8/8), based off cultures. - Hand surgery following, appreciate recommendations - Hydrotherapy per PT - Follow surgical wound cultures - Continue vancomycin 1g daily, could transition to PO as able per cultures - AM CBC, BMP, Mag

## 2023-11-14 NOTE — Hospital Course (Addendum)
 Sally Mclaughlin is a 58 y.o.female with a history of T2DM, who was admitted to the [facility] Medicine Teaching Service at [facility] for osteomyelitis of R index finger. Her hospital course is detailed below:  Osteomyelitis of R Index Finger Right infected index finger, MRI confirmed findings of cellulitis and osteomyelitis. Hand surgery consulted in the ED and performed I&D with cultures.  Recommended hydrotherapy s/p I&D, which she tolerated well***.  Treated with Vancomycin (6/27 - 6/30) and transitioned to Amoxicillin 500 TID while inpatient due to culture growing GBS. Wound culture showed MSSA and candida species 7/1. ID consulted. Amoxicillin switched to Linezolid 600 mg and fluconazole  400 mg added.   ***and switched to Amoxicillin 875 BID upon discharge***,  to complete 6-week course on 8/8.  T2DM Sugars were difficult to control while inpatient.  A1c > 15.5.  Started on LAI and moderate SSI ACHS.  Was discharged on ***units.  Started Lipitor 80 mg daily.  HTN BP elevated while inpatient.  Started combo medicine at discharge ***and continued amlodipine  10 mg daily***to help manage blood pressures.  PCP Follow-up Recommendations: Insulin  titration BP management

## 2023-11-14 NOTE — Assessment & Plan Note (Deleted)
 Of the left great toe - Continue topical clotrimazole

## 2023-11-14 NOTE — Assessment & Plan Note (Addendum)
 A1c pending.  Sugars have been difficult to control while inpatient, CBG 230s-340s. - Increase Semglee  to 15u daily - Moderate SSI ACHS - Lipitor 80 mg daily

## 2023-11-15 ENCOUNTER — Telehealth (HOSPITAL_COMMUNITY): Payer: Self-pay | Admitting: Pharmacy Technician

## 2023-11-15 ENCOUNTER — Other Ambulatory Visit (HOSPITAL_COMMUNITY): Payer: Self-pay

## 2023-11-15 DIAGNOSIS — M869 Osteomyelitis, unspecified: Secondary | ICD-10-CM | POA: Diagnosis not present

## 2023-11-15 DIAGNOSIS — E1140 Type 2 diabetes mellitus with diabetic neuropathy, unspecified: Secondary | ICD-10-CM | POA: Diagnosis not present

## 2023-11-15 DIAGNOSIS — I10 Essential (primary) hypertension: Secondary | ICD-10-CM | POA: Diagnosis not present

## 2023-11-15 LAB — BASIC METABOLIC PANEL WITH GFR
Anion gap: 6 (ref 5–15)
BUN: 28 mg/dL — ABNORMAL HIGH (ref 6–20)
CO2: 25 mmol/L (ref 22–32)
Calcium: 9 mg/dL (ref 8.9–10.3)
Chloride: 103 mmol/L (ref 98–111)
Creatinine, Ser: 1.06 mg/dL — ABNORMAL HIGH (ref 0.44–1.00)
GFR, Estimated: >60 mL/min (ref >60)
Glucose, Bld: 266 mg/dL — ABNORMAL HIGH (ref 70–99)
Potassium: 4.4 mmol/L (ref 3.5–5.1)
Sodium: 134 mmol/L — ABNORMAL LOW (ref 135–145)

## 2023-11-15 LAB — CBC WITH DIFFERENTIAL/PLATELET
Abs Immature Granulocytes: 0.04 10*3/uL (ref 0.00–0.07)
Basophils Absolute: 0 10*3/uL (ref 0.0–0.1)
Basophils Relative: 0 %
Eosinophils Absolute: 0.1 10*3/uL (ref 0.0–0.5)
Eosinophils Relative: 1 %
HCT: 33.8 % — ABNORMAL LOW (ref 36.0–46.0)
Hemoglobin: 11.4 g/dL — ABNORMAL LOW (ref 12.0–15.0)
Immature Granulocytes: 1 %
Lymphocytes Relative: 39 %
Lymphs Abs: 2.7 10*3/uL (ref 0.7–4.0)
MCH: 30.2 pg (ref 26.0–34.0)
MCHC: 33.7 g/dL (ref 30.0–36.0)
MCV: 89.4 fL (ref 80.0–100.0)
Monocytes Absolute: 0.4 10*3/uL (ref 0.1–1.0)
Monocytes Relative: 6 %
Neutro Abs: 3.7 10*3/uL (ref 1.7–7.7)
Neutrophils Relative %: 53 %
Platelets: 257 10*3/uL (ref 150–400)
RBC: 3.78 MIL/uL — ABNORMAL LOW (ref 3.87–5.11)
RDW: 12.3 % (ref 11.5–15.5)
WBC: 6.9 10*3/uL (ref 4.0–10.5)
nRBC: 0 % (ref 0.0–0.2)

## 2023-11-15 LAB — GLUCOSE, CAPILLARY
Glucose-Capillary: 266 mg/dL — ABNORMAL HIGH (ref 70–99)
Glucose-Capillary: 343 mg/dL — ABNORMAL HIGH (ref 70–99)
Glucose-Capillary: 117 mg/dL — ABNORMAL HIGH (ref 70–99)
Glucose-Capillary: 290 mg/dL — ABNORMAL HIGH (ref 70–99)

## 2023-11-15 LAB — HEMOGLOBIN A1C
Hgb A1c MFr Bld: >15.5 % — ABNORMAL HIGH (ref 4.8–5.6)
Mean Plasma Glucose: >398 mg/dL

## 2023-11-15 LAB — MAGNESIUM: Magnesium: 1.7 mg/dL (ref 1.7–2.4)

## 2023-11-15 MED ORDER — INSULIN GLARGINE-YFGN 100 UNIT/ML ~~LOC~~ SOLN
25.0000 [IU] | Freq: Every day | SUBCUTANEOUS | Status: DC
  Administered 2023-11-16 – 2023-11-18 (×3): 25 [IU] via SUBCUTANEOUS
  Filled 2023-11-15 (×3): qty 0.25

## 2023-11-15 MED ORDER — AMOXICILLIN 500 MG PO CAPS
500.0000 mg | ORAL_CAPSULE | Freq: Three times a day (TID) | ORAL | Status: DC
  Administered 2023-11-16 (×2): 500 mg via ORAL
  Filled 2023-11-15 (×3): qty 1

## 2023-11-15 MED ORDER — MAGNESIUM SULFATE 2 GM/50ML IV SOLN
2.0000 g | Freq: Once | INTRAVENOUS | Status: AC
  Administered 2023-11-15: 2 g via INTRAVENOUS
  Filled 2023-11-15: qty 50

## 2023-11-15 MED ORDER — INSULIN ASPART 100 UNIT/ML IJ SOLN
0.0000 [IU] | Freq: Three times a day (TID) | INTRAMUSCULAR | Status: DC
  Administered 2023-11-16: 3 [IU] via SUBCUTANEOUS
  Administered 2023-11-16 (×2): 11 [IU] via SUBCUTANEOUS
  Administered 2023-11-17 (×3): 4 [IU] via SUBCUTANEOUS

## 2023-11-15 MED ORDER — OXYCODONE HCL 5 MG PO TABS
5.0000 mg | ORAL_TABLET | ORAL | Status: DC | PRN
  Administered 2023-11-16: 5 mg via ORAL
  Filled 2023-11-15: qty 1

## 2023-11-15 MED ORDER — OXYCODONE HCL 5 MG PO TABS: 10.0000 mg | ORAL_TABLET | Freq: Every day | ORAL | Status: DC | PRN

## 2023-11-15 MED ORDER — INSULIN GLARGINE-YFGN 100 UNIT/ML ~~LOC~~ SOLN
20.0000 [IU] | Freq: Every day | SUBCUTANEOUS | Status: DC
  Administered 2023-11-15: 20 [IU] via SUBCUTANEOUS
  Filled 2023-11-15: qty 0.2

## 2023-11-15 MED ORDER — AMLODIPINE BESYLATE 10 MG PO TABS
10.0000 mg | ORAL_TABLET | Freq: Every morning | ORAL | Status: DC
  Administered 2023-11-15 – 2023-11-18 (×4): 10 mg via ORAL
  Filled 2023-11-15 (×4): qty 1

## 2023-11-15 NOTE — Plan of Care (Signed)
  Problem: Coping: Goal: Ability to adjust to condition or change in health will improve Outcome: Progressing   Problem: Fluid Volume: Goal: Ability to maintain a balanced intake and output will improve Outcome: Progressing   Problem: Health Behavior/Discharge Planning: Goal: Ability to identify and utilize available resources and services will improve Outcome: Progressing Goal: Ability to manage health-related needs will improve Outcome: Progressing

## 2023-11-15 NOTE — Plan of Care (Signed)
  Problem: Education: Goal: Ability to describe self-care measures that may prevent or decrease complications (Diabetes Survival Skills Education) will improve Outcome: Progressing   Problem: Skin Integrity: Goal: Risk for impaired skin integrity will decrease Outcome: Progressing   Problem: Activity: Goal: Risk for activity intolerance will decrease Outcome: Progressing   Problem: Safety: Goal: Ability to remain free from injury will improve Outcome: Progressing   Problem: Skin Integrity: Goal: Risk for impaired skin integrity will decrease Outcome: Progressing

## 2023-11-15 NOTE — Assessment & Plan Note (Deleted)
 Repeat lactic acid unfortunately not resulted from initial value of 2.3.  Low concern for sepsis.  Continue antibiotics as above. - Continue to monitor clinically

## 2023-11-15 NOTE — Assessment & Plan Note (Addendum)
 S/p I&D on 6/27 after MRI confirmed findings of cellulitis and osteomyelitis of R index finger.  Antibiotic treatment for 6 weeks (6/27 - 8/8), culture grew few GBS. - Hand surgery following, appreciate recommendations - Hydrotherapy per PT, Oxy 10 mg PRN w/ hydrotherapy for breakthrough pain - Pain reg: Tylenol q6h prn for mild pain, Hydrocodone-Acetaminophen 5-325 mg q4h prn for moderate pain - Follow surgical wound cultures - Discontinue vancomycin 1g daily, will transition to Amoxicillin 500 TID (7/1) while inpatient and switch to Amoxicillin 875 BID upon discharge to end 8/8 (not on formulary) - AM CBC, BMP

## 2023-11-15 NOTE — Progress Notes (Signed)
 Physical Therapy Wound Treatment Patient Details  Name: Sally Mclaughlin MRN: [id number] Date of Birth: [birth year]  Today's Date: 11/15/2023 Time: 1030-1041 Time Calculation (min): 11 min  Subjective  Subjective Assessment Subjective: Pt pleasant and agreeable to wound therapy session. Patient and Family Stated Goals: Return to work Date of Onset: 10/28/23 (Approximately) Prior Treatments: I&D per Dr. Murrell 6/27  Pain Score:  2  Wound Assessment  Wound 11/12/23 1840 Surgical Closed Surgical Incision Hand (Active)  Site / Wound Assessment Dressing in place / Unable to assess 11/14/23 2000  Drainage Amount None 11/14/23 2000  Dressing Type Compression wrap 11/14/23 0715  Dressing Changed New 11/14/23 2000  Dressing Status Clean, Dry, Intact 11/14/23 0715     Wound 11/14/23 1455 Surgical Open Surgical Incision Finger (Comment which one) Anterior;Right (Active)  Wound Image    11/14/23 1453  Site / Wound Assessment Pink 11/15/23 1300  Peri-wound Assessment Intact 11/15/23 1300  Wound Length (cm) 0.3 cm 11/14/23 1453  Wound Width (cm) 0.1 cm 11/14/23 1453  Wound Surface Area (cm^2) 0.02 cm^2 11/14/23 1453  Wound Depth (cm) 0.8 cm 11/14/23 1453  Wound Volume (cm^3) 0.013 cm^3 11/14/23 1453  Drainage Description Serosanguineous 11/15/23 1300  Drainage Amount Scant 11/15/23 1300  Treatment Irrigation;Packing (Saline gauze) 11/15/23 1300  Dressing Type Gauze (Comment);Normal saline moist dressing;Non adherent;Moist to moist 11/15/23 1300  Dressing Changed Changed 11/15/23 1300  Dressing Status Clean, Dry, Intact 11/15/23 1300  % Wound base Red or Granulating 100% 11/15/23 1300  % Wound base Yellow/Fibrinous Exudate 0% 11/14/23 1453  % Wound base Black/Eschar 0% 11/14/23 1453  % Wound base Other/Granulation Tissue (Comment) 0% 11/14/23 1453  Tunneling (cm) 0 11/14/23 1453  Undermining (cm) 0 11/14/23 1453  Margins Unattached edges (unapproximated) 11/15/23 1300        Wound  Assessment and Plan  Wound Therapy - Assess/Plan/Recommendations Wound Therapy - Clinical Statement: Pt premedicated prior to therapy. 1/4 Iodoform and coban not in room. Asked RN to reorder and they did not come during therapy session. Dressing had fallen off from yesterday and night RN had redressed it. PT anchored dressing to middle finger in attempt to keep it in place. PT will follow back tomorrow to reasses dressing., provide pt with education on dressing and will likely discharge from Hydrotherapy caseload. Wound Therapy - Functional Problem List: Acute pain Factors Delaying/Impairing Wound Healing: Diabetes Mellitus Hydrotherapy Plan: Dressing change, Patient/family education Wound Therapy - Frequency: 5X / week Wound Therapy - Follow Up Recommendations: dressing changes by family/patient  Wound Therapy Goals- Improve the function of patient's integumentary system by progressing the wound(s) through the phases of wound healing (inflammation - proliferation - remodeling) by: Wound Therapy Goals - Improve the function of patient's integumentary system by progressing the wound(s) through the phases of wound healing by: Improve Drainage Characteristics: Min, Serous Improve Drainage Characteristics - Progress: Progressing toward goal Patient/Family will be able to : demonstrate proper dressing changes or be able to instruct caregiver in dressing changes without assist or cues. Patient/Family Instruction Goal - Progress: Progressing toward goal Goals/treatment plan/discharge plan were made with and agreed upon by patient/family: Yes Time For Goal Achievement: 7 days Wound Therapy - Potential for Goals: Excellent  Goals will be updated until maximal potential achieved or discharge criteria met.  Discharge criteria: when goals achieved, discharge from hospital, MD decision/surgical intervention, no progress towards goals, refusal/missing three consecutive treatments without notification or  medical reason.  GP     Charges PT Wound Care Charges $  Wound Debridement up to 20 cm: < or equal to 20 cm $PT Hydrotherapy Visit: 1 Visit     Krishiv Sandler B. Fleeta Lapidus PT, DPT Acute Rehabilitation Services Please use secure chat or  Call Office [phone]   [name] 11/15/2023, 1:33 PM

## 2023-11-15 NOTE — Assessment & Plan Note (Addendum)
 BP elevated this morning to 150-160s/70-90s.  - Irbesartan 300 mg daily - Continue home HCTZ 25 mg daily and increase amlodipine  10 mg daily - Consider combo med (Valsartan/HCTZ) at discharge for better compliance

## 2023-11-15 NOTE — Assessment & Plan Note (Addendum)
 A1c >15.5.  Sugars have been difficult to control while inpatient, CBG 230s-310s. - Increase Semglee  to 20u daily - Moderate SSI ACHS - Lipitor 80 mg daily

## 2023-11-15 NOTE — Progress Notes (Signed)
     Daily Progress Note Intern Pager: [phone]  Patient name: Sally Mclaughlin Medical record number: [id number] Date of birth: February 06, 1966 Age: 58 y.o. Gender: female  Primary Care Provider: Joshua Domino, DO Consultants: Hand Surgery Code Status: Full Code  Pt Overview and Major Events to Date:  6/27: Admitted; I&D per Hand Surgery  Assessment and Plan: Sally Mclaughlin is a 58 y.o. female with PMH of  T2DM, HTN, and HLD here with cellulitis and osteomyelitis of the right index finger, now s/p I&D. Assessment & Plan Osteomyelitis of finger (HCC) S/p I&D on 6/27 after MRI confirmed findings of cellulitis and osteomyelitis of R index finger.  Antibiotic treatment for 6 weeks (6/27 - 8/8), culture grew few GBS. - Hand surgery following, appreciate recommendations - Hydrotherapy per PT, Oxy 10 mg PRN w/ hydrotherapy for breakthrough pain - Pain reg: Tylenol q6h prn for mild pain, Hydrocodone-Acetaminophen 5-325 mg q4h prn for moderate pain - Follow surgical wound cultures - Discontinue vancomycin 1g daily, will transition to Amoxicillin 500 TID (7/1) while inpatient and switch to Amoxicillin 875 BID upon discharge to end 8/8 (not on formulary) - AM CBC, BMP T2DM (type 2 diabetes mellitus) (HCC) A1c >15.5.  Sugars have been difficult to control while inpatient, CBG 230s-310s. - Increase Semglee  to 20u daily - Moderate SSI ACHS - Lipitor 80 mg daily HTN (hypertension) BP elevated this morning to 150-160s/70-90s.  - Irbesartan 300 mg daily - Continue home HCTZ 25 mg daily and increase amlodipine  10 mg daily - Consider combo med (Valsartan/HCTZ) at discharge for better compliance  FEN/GI: Carb modified PPx: Lovenox Dispo: Pending clinical improvement and surgery recs  Subjective:  Patient is doing well this morning.  Sitting up at bedside.  No concerns.  Getting ready to give up for breakfast.  Objective: Temp:  [97.5 F (36.4 C)-98.2 F (36.8 C)] 97.8 F (36.6 C) (06/30  1417) Pulse Rate:  [65-80] 65 (06/30 1417) Resp:  [16-18] 18 (06/30 1417) BP: (120-165)/(56-90) 120/56 (06/30 1417) SpO2:  [99 %-100 %] 100 % (06/30 1417) Physical Exam: General: Awake and Alert in NAD HEENT: NCAT. Sclera anicteric. No rhinorrhea. Cardiovascular: RRR. 3/6 whooshing systolic murmur. Respiratory: CTAB, normal WOB on RA. No wheezing, crackles, rhonchi, or diminished breath sounds. Abdomen: Soft, non-tender, non-distended. Bowel sounds normoactive Extremities: R index finger wrapped, C/D/I. Patient able to move all extremities. No BLE edema, no deformities or significant joint findings. Skin: Warm and dry. No abrasions or rashes noted. Neuro: A&Ox3. No focal neurological deficits.  Laboratory: Most recent CBC Lab Results  Component Value Date   WBC 6.9 11/15/2023   HGB 11.4 (L) 11/15/2023   HCT 33.8 (L) 11/15/2023   MCV 89.4 11/15/2023   PLT 257 11/15/2023   Most recent BMP    Latest Ref Rng & Units 11/15/2023    5:00 AM  BMP  Glucose 70 - 99 mg/dL 733   BUN 6 - 20 mg/dL 28   Creatinine 9.55 - 1.00 mg/dL 8.93   Sodium 864 - 854 mmol/L 134   Potassium 3.5 - 5.1 mmol/L 4.4   Chloride 98 - 111 mmol/L 103   CO2 22 - 32 mmol/L 25   Calcium  8.9 - 10.3 mg/dL 9.0    Imaging/Diagnostic Tests: No new imaging.  Janna Ferrier, DO 11/15/2023, 3:05 PM  PGY-1, [facility] Family Medicine FPTS Intern pager: [phone], text pages welcome Secure chat group [facility] Teaching Service

## 2023-11-15 NOTE — Inpatient Diabetes Management (Signed)
 Inpatient Diabetes Program Recommendations  AACE/ADA: New Consensus Statement on Inpatient Glycemic Control (2015)  Target Ranges:  Prepandial:   less than 140 mg/dL      Peak postprandial:   less than 180 mg/dL (1-2 hours)      Critically ill patients:  140 - 180 mg/dL   Lab Results  Component Value Date   GLUCAP 343 (H) 11/15/2023   HGBA1C >15.5 (H) 11/12/2023    Review of Glycemic Control  Diabetes history: DM2 Outpatient Diabetes medications: none Current orders for Inpatient glycemic control: Semglee  20 units daily, Novolog  0-15 units correction scale TID, Novolog  0-5 units HS scale  Inpatient Diabetes Program Recommendations:   Spoke to patient at the bedside. Patient states that she was diagnosed a long time ago. She had been taking Ozempic  until about at least 1 year ago. Noted that HgbA1C is >15.5% now. Patient states that she has not been on any medications for diabetes lately. 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 She lives in [place] alone.   Patient was saying that it was hard for her at home, since she lives alone. States that she has a father in a nursing home and different issues with her family. She states that she would rather not take insulin  due to fear of taking different medications. Patient does not check blood sugars at home.  Will continue to follow blood sugars while in the hospital. Our team will follow up with patient before discharge.   Marjorie Lunger RN BSN CDE Diabetes Coordinator Pager: [phone]  8am-5pm

## 2023-11-15 NOTE — Telephone Encounter (Addendum)
 Patient Product/process development scientist completed.    The patient is insured through [facility]. Patient has [employer], may use a copay card, and/or apply for patient assistance if available.    Ran test claim for valsartan-hydrochlorothiazide  160-12.5 mg and the current 30 day co-pay is $5.00.  Ran test claim for Semglee  Pen and the current 30 day co-pay is $74.92.  Ran test claim for Lyumjev KwikPen and the current 30 day co-pay is $98.35.  This test claim was processed through [facility]- copay amounts may vary at other pharmacies due to pharmacy/plan contracts, or as the patient moves through the different stages of their insurance plan.     Reyes Sharps, CPHT Pharmacy Technician III Certified Patient Advocate [facility] Patient Advocate Team Direct Number: [phone]  Fax: [phone]

## 2023-11-15 NOTE — Progress Notes (Signed)
  Progress Note   Date: 11/15/2023  Patient Name: Sally Mclaughlin        MRN#: [id number]  Magnesium was given for hypomagnesemia

## 2023-11-16 DIAGNOSIS — I10 Essential (primary) hypertension: Secondary | ICD-10-CM | POA: Diagnosis not present

## 2023-11-16 DIAGNOSIS — E1140 Type 2 diabetes mellitus with diabetic neuropathy, unspecified: Secondary | ICD-10-CM

## 2023-11-16 DIAGNOSIS — Z789 Other specified health status: Secondary | ICD-10-CM

## 2023-11-16 DIAGNOSIS — M869 Osteomyelitis, unspecified: Secondary | ICD-10-CM | POA: Diagnosis not present

## 2023-11-16 LAB — BASIC METABOLIC PANEL WITH GFR
Anion gap: 7 (ref 5–15)
BUN: 30 mg/dL — ABNORMAL HIGH (ref 6–20)
CO2: 26 mmol/L (ref 22–32)
Calcium: 9.1 mg/dL (ref 8.9–10.3)
Chloride: 101 mmol/L (ref 98–111)
Creatinine, Ser: 0.95 mg/dL (ref 0.44–1.00)
GFR, Estimated: >60 mL/min (ref >60)
Glucose, Bld: 281 mg/dL — ABNORMAL HIGH (ref 70–99)
Potassium: 4.2 mmol/L (ref 3.5–5.1)
Sodium: 134 mmol/L — ABNORMAL LOW (ref 135–145)

## 2023-11-16 LAB — CBC
HCT: 33.1 % — ABNORMAL LOW (ref 36.0–46.0)
Hemoglobin: 11.4 g/dL — ABNORMAL LOW (ref 12.0–15.0)
MCH: 30.6 pg (ref 26.0–34.0)
MCHC: 34.4 g/dL (ref 30.0–36.0)
MCV: 89 fL (ref 80.0–100.0)
Platelets: 259 10*3/uL (ref 150–400)
RBC: 3.72 MIL/uL — ABNORMAL LOW (ref 3.87–5.11)
RDW: 12.3 % (ref 11.5–15.5)
WBC: 6.1 10*3/uL (ref 4.0–10.5)
nRBC: 0 % (ref 0.0–0.2)

## 2023-11-16 LAB — MAGNESIUM: Magnesium: 1.9 mg/dL (ref 1.7–2.4)

## 2023-11-16 LAB — GLUCOSE, CAPILLARY
Glucose-Capillary: 260 mg/dL — ABNORMAL HIGH (ref 70–99)
Glucose-Capillary: 253 mg/dL — ABNORMAL HIGH (ref 70–99)
Glucose-Capillary: 129 mg/dL — ABNORMAL HIGH (ref 70–99)
Glucose-Capillary: 169 mg/dL — ABNORMAL HIGH (ref 70–99)

## 2023-11-16 MED ORDER — LINEZOLID 600 MG PO TABS
600.0000 mg | ORAL_TABLET | Freq: Two times a day (BID) | ORAL | Status: DC
  Administered 2023-11-16 – 2023-11-17 (×2): 600 mg via ORAL
  Filled 2023-11-16 (×2): qty 1

## 2023-11-16 MED ORDER — FLUCONAZOLE 200 MG PO TABS
400.0000 mg | ORAL_TABLET | Freq: Every day | ORAL | Status: DC
  Administered 2023-11-16 – 2023-11-18 (×3): 400 mg via ORAL
  Filled 2023-11-16 (×3): qty 2

## 2023-11-16 NOTE — Assessment & Plan Note (Addendum)
 HDS. Afebrile. S/p I&D on 6/27 after MRI confirmed findings of cellulitis and osteomyelitis of R index finger.  Antibiotic treatment for 6 weeks (6/27 - 8/8), culture grew few GBS, candida albicans, and MSSA. - Hand surgery following, appreciate recommendations - Hydrotherapy per PT, Oxy 10 mg PRN w/ hydrotherapy for breakthrough pain - Pain reg: Tylenol q6h prn for mild pain, Hydrocodone-Acetaminophen 5-325 mg q4h prn for moderate pain - Follow surgical wound cultures - Amoxicillin 500 TID (7/1) while inpatient and switch to Amoxicillin 875 BID upon discharge to end 8/8 (not on formulary) - AM CBC, BMP -- ID consult for antibiotic coverage

## 2023-11-16 NOTE — Assessment & Plan Note (Addendum)
 A1c >15.5.  Sugars have been difficult to control while inpatient, CBG 230s-310s. - Continue Semglee  25u daily - Moderate SSI ACHS - Lipitor 80 mg daily

## 2023-11-16 NOTE — Assessment & Plan Note (Signed)
 HLD: atorvastatin  80mg 

## 2023-11-16 NOTE — Plan of Care (Signed)

## 2023-11-16 NOTE — Progress Notes (Signed)
 Physical Therapy Wound Treatment Patient Details  Name: Sally Mclaughlin MRN: [id number] Date of Birth: 1965-07-22  Today's Date: 11/16/2023 Time: 1205-1238  Time Calculation (min): 33 min  Subjective  Subjective Assessment Subjective: Pt pleasant and agreeable to wound therapy Patient and Family Stated Goals: Return to work Date of Onset: 10/28/23 (approximately) Prior Treatments: I&D per Dr. Murrell 6/27  Pain Score:   3/10  Wound Assessment  Wound 11/14/23 1455 Surgical Open Surgical Incision Finger (Comment which one) Anterior;Right (Active)  Wound Image    11/16/23 1200  Site / Wound Assessment Brown;Red;Clean;Dry 11/16/23 1200  Peri-wound Assessment Intact 11/16/23 1200  Wound Length (cm) 0.1 cm 11/16/23 1200  Wound Width (cm) 0.1 cm 11/16/23 1200  Wound Surface Area (cm^2) 0.01 cm^2 11/16/23 1200  Wound Depth (cm) 0 cm 11/16/23 1200  Wound Volume (cm^3) 0 cm^3 11/16/23 1200  Drainage Description Serosanguineous 11/15/23 1300  Drainage Amount None 11/16/23 1200  Treatment Irrigation 11/16/23 1200  Dressing Type Gauze (Comment);Impregnated gauze (bismuth) 11/16/23 1200  Dressing Changed Other (Comment) 11/16/23 1200  Dressing Status Clean, Dry, Intact 11/16/23 1200  % Wound base Red or Granulating 100% 11/15/23 1300  % Wound base Yellow/Fibrinous Exudate 0% 11/14/23 1453  % Wound base Black/Eschar 0% 11/14/23 1453  % Wound base Other/Granulation Tissue (Comment) 0% 11/14/23 1453  Tunneling (cm) 0 11/14/23 1453  Undermining (cm) 0 11/14/23 1453  Margins Attached edges (approximated) 11/16/23 1200        Wound Assessment and Plan  Wound Therapy - Assess/Plan/Recommendations Wound Therapy - Clinical Statement: Pt premedicated with no complaints of pain throughout session. No dressing in place when PT entered. Pt reports that dressing fell off yesterday when she was using the bathroom and RN never replaced it. PT assessed wound and wound had closed and scabbed over. Periwound  was dried. PT redressed the wound with xeroform gauze. Anchored dressing to middle finger and thumb and then wrapped with coban to keep in place and anchored that to the wrist. Educated pt on need to keep wound clean and dressing intact for ultimate healing of the whole finger. PT signing off. Wound Therapy - Functional Problem List: use of finger Factors Delaying/Impairing Wound Healing: Diabetes Mellitus Hydrotherapy Plan: Dressing change, Patient/family education Wound Therapy - Frequency: Other (comment) (discharge today as pt no longer has open wound to pack) Wound Therapy - Follow Up Recommendations: dressing changes by RN  Wound Therapy Goals- Improve the function of patient's integumentary system by progressing the wound(s) through the phases of wound healing (inflammation - proliferation - remodeling) by: Wound Therapy Goals - Improve the function of patient's integumentary system by progressing the wound(s) through the phases of wound healing by: Improve Drainage Characteristics: Min, Serous Improve Drainage Characteristics - Progress: Met Patient/Family will be able to : demonstrate proper dressing changes or be able to instruct caregiver in dressing changes without assist or cues. Patient/Family Instruction Goal - Progress: Progressing toward goal (Nursing can continue) Goals/treatment plan/discharge plan were made with and agreed upon by patient/family: Yes Time For Goal Achievement: 7 days Wound Therapy - Potential for Goals: Excellent  Goals will be updated until maximal potential achieved or discharge criteria met.  Discharge criteria: when goals achieved, discharge from hospital, MD decision/surgical intervention, no progress towards goals, refusal/missing three consecutive treatments without notification or medical reason.  GP     Charges PT Wound Care Charges $PT Hydrotherapy Dressing: 2 dressings     Travas Schexnayder B. Van Fleet PT, DPT Acute Rehabilitation Services Please  use secure chat or  Call Office [phone]   Sally Mclaughlin [facility] 11/16/2023, 3:57 PM

## 2023-11-16 NOTE — Inpatient Diabetes Management (Signed)
 Inpatient Diabetes Program Recommendations  AACE/ADA: New Consensus Statement on Inpatient Glycemic Control (2015)  Target Ranges:  Prepandial:   less than 140 mg/dL      Peak postprandial:   less than 180 mg/dL (1-2 hours)      Critically ill patients:  140 - 180 mg/dL   Lab Results  Component Value Date   GLUCAP 253 (H) 11/16/2023   HGBA1C >15.5 (H) 11/12/2023    Review of Glycemic Control  Diabetes history: DM 2 Outpatient Diabetes medications: None Current orders for Inpatient glycemic control:  Semglee  20 units Daily Novolog  0-20 units tid + hs  A1c >15.5%  Discharge Recommendations: Supply/Referral recommendations: Glucometer Test strips Lancet device Lancets   Use Adult Diabetes Insulin  Treatment Post Discharge order set.   Spoke with pt at bedside again trying to explain her A1c level severity and need for possible insulin . Asked pt to consider it. Explained how each oral pill may lower A1c 1-2 % at most if they work. Pt being worried about being on multiple medications and falling out at work. I discussed the potential benefits of once daily long acting insulin  by a copay card $35. Pt maybe open to once daily basal injection at time of discharge. I will follow back up with her tomorrow.   Thanks,  [name] RN, MSN, BC-ADM Inpatient Diabetes Coordinator Team Pager [phone] (8a-5p)

## 2023-11-16 NOTE — Progress Notes (Signed)
 Daily Progress Note Intern Pager: [phone]  Patient name: Sally Mclaughlin Medical record number: [id number] Date of birth: June 22, 1965 Age: 58 y.o. Gender: female  Primary Care Provider: Cleotilde Lukes, DO Consultants: Hand Surgery Code Status: Full Code  Pt Overview and Major Events to Date:  6/27: Patient admitted; I&D per Hand Surgery  Medical Decision Making:  Sally Mclaughlin is a 58 year old female with cellulitis and osteomyelitis of the right index finger, s/p I&D and now receiving hydrotherapy with steady improvement. New wound culture growth of candida and MSSA.  Will consult ID for multi microbial culture growth.   Pertinent PMH/PSH includes T2DM, HTN, and HLD.  Assessment & Plan Osteomyelitis of finger (HCC) HDS. Afebrile. S/p I&D on 6/27 after MRI confirmed findings of cellulitis and osteomyelitis of R index finger.  Antibiotic treatment for 6 weeks (6/27 - 8/8), culture grew few GBS, candida albicans, and MSSA. - Hand surgery following, appreciate recommendations - Hydrotherapy per PT, Oxy 10 mg PRN w/ hydrotherapy for breakthrough pain - Pain reg: Tylenol q6h prn for mild pain, Hydrocodone-Acetaminophen 5-325 mg q4h prn for moderate pain - Follow surgical wound cultures - Amoxicillin 500 TID (7/1) while inpatient and switch to Amoxicillin 875 BID upon discharge to end 8/8 (not on formulary) - AM CBC, BMP -- ID consult for antibiotic coverage T2DM (type 2 diabetes mellitus) (HCC) A1c >15.5.  Sugars have been difficult to control while inpatient, CBG 230s-310s. - Continue Semglee  25u daily - Moderate SSI ACHS - Lipitor 80 mg daily HTN (hypertension) BP elevated this morning to 182/84 prior to daily BP meds. 144/71 after medications were given.   - Irbesartan 300 mg daily - Continue home HCTZ 25 mg daily  -- Amlodipine  10 mg daily - Consider combo med (Valsartan/HCTZ) at discharge for better compliance Chronic health problem HLD: atorvastatin  80mg      FEN/GI: Carb modified PPx: Lovenox Dispo:Home pending ID recs. Barriers include potential for prolonged IV antibiotic course.   Subjective:  Patient states she is fine this morning. Lying in bed. Denies any pain or concerns at this time. Patient had yet to order breakfast.   Objective: Temp:  [97.4 F (36.3 C)-98.5 F (36.9 C)] 98.1 F (36.7 C) (07/01 0825) Pulse Rate:  [65-75] 68 (07/01 0825) Resp:  [16-18] 16 (07/01 0825) BP: (120-182)/(56-84) 182/84 (07/01 0825) SpO2:  [97 %-100 %] 97 % (07/01 0825) Physical Exam: General: Awake and alert in NAD.  Cardiovascular: RRR, systolic murmur. Respiratory: CTAB, normal work of breathing on room air.  Abdomen: Soft, non-tender, non-distended.  Extremities: R index finger unwrapped, new skin covering wound. Wound is dry and clean.   Laboratory: Most recent CBC Lab Results  Component Value Date   WBC 6.1 11/16/2023   HGB 11.4 (L) 11/16/2023   HCT 33.1 (L) 11/16/2023   MCV 89.0 11/16/2023   PLT 259 11/16/2023   Most recent BMP    Latest Ref Rng & Units 11/16/2023    3:25 AM  BMP  Glucose 70 - 99 mg/dL 718   BUN 6 - 20 mg/dL 30   Creatinine 9.55 - 1.00 mg/dL 9.04   Sodium 864 - 854 mmol/L 134   Potassium 3.5 - 5.1 mmol/L 4.2   Chloride 98 - 111 mmol/L 101   CO2 22 - 32 mmol/L 26   Calcium  8.9 - 10.3 mg/dL 9.1     Lennie Raguel MATSU, DO 11/16/2023, 10:47 AM  PGY-1, [place] [facility] Medicine FPTS Intern pager: [phone], text pages  welcome Secure chat group [facility] Teaching Service

## 2023-11-16 NOTE — Assessment & Plan Note (Addendum)
 BP elevated this morning to 182/84 prior to daily BP meds. 144/71 after medications were given.   - Irbesartan 300 mg daily - Continue home HCTZ 25 mg daily  -- Amlodipine  10 mg daily - Consider combo med (Valsartan/HCTZ) at discharge for better compliance

## 2023-11-17 ENCOUNTER — Other Ambulatory Visit (HOSPITAL_COMMUNITY): Payer: Self-pay

## 2023-11-17 DIAGNOSIS — I10 Essential (primary) hypertension: Secondary | ICD-10-CM | POA: Diagnosis not present

## 2023-11-17 DIAGNOSIS — E1140 Type 2 diabetes mellitus with diabetic neuropathy, unspecified: Secondary | ICD-10-CM | POA: Diagnosis not present

## 2023-11-17 DIAGNOSIS — M86141 Other acute osteomyelitis, right hand: Secondary | ICD-10-CM

## 2023-11-17 DIAGNOSIS — M869 Osteomyelitis, unspecified: Secondary | ICD-10-CM

## 2023-11-17 DIAGNOSIS — A499 Bacterial infection, unspecified: Secondary | ICD-10-CM

## 2023-11-17 DIAGNOSIS — L089 Local infection of the skin and subcutaneous tissue, unspecified: Principal | ICD-10-CM

## 2023-11-17 LAB — CULTURE, BLOOD (ROUTINE X 2)
Culture: NO GROWTH
Culture: NO GROWTH
Special Requests: ADEQUATE

## 2023-11-17 LAB — AEROBIC/ANAEROBIC CULTURE W GRAM STAIN (SURGICAL/DEEP WOUND)
Gram Stain: NONE SEEN
Gram Stain: NONE SEEN

## 2023-11-17 LAB — GLUCOSE, CAPILLARY
Glucose-Capillary: 156 mg/dL — ABNORMAL HIGH (ref 70–99)
Glucose-Capillary: 181 mg/dL — ABNORMAL HIGH (ref 70–99)
Glucose-Capillary: 186 mg/dL — ABNORMAL HIGH (ref 70–99)
Glucose-Capillary: 278 mg/dL — ABNORMAL HIGH (ref 70–99)

## 2023-11-17 MED ORDER — AMLODIPINE BESYLATE 10 MG PO TABS
10.0000 mg | ORAL_TABLET | Freq: Every morning | ORAL | 0 refills | Status: DC
  Filled 2023-11-17: qty 30, 30d supply, fill #0

## 2023-11-17 MED ORDER — FLUCONAZOLE 200 MG PO TABS
400.0000 mg | ORAL_TABLET | Freq: Every day | ORAL | 10 refills | Status: DC
  Filled 2023-11-17: qty 84, 42d supply, fill #0
  Filled 2023-12-22 – 2023-12-23 (×2): qty 84, 42d supply, fill #1

## 2023-11-17 MED ORDER — BLOOD GLUCOSE MONITOR SYSTEM W/DEVICE KIT
1.0000 | PACK | Freq: Three times a day (TID) | 0 refills | Status: AC
  Filled 2023-11-17: qty 1, 30d supply, fill #0

## 2023-11-17 MED ORDER — LANCET DEVICE MISC
1.0000 | Freq: Three times a day (TID) | 0 refills | Status: AC
  Filled 2023-11-17: qty 1, 30d supply, fill #0
  Filled 2023-11-17: qty 1, fill #0

## 2023-11-17 MED ORDER — INSULIN PEN NEEDLE 32G X 4 MM MISC
1.0000 | Freq: Three times a day (TID) | 0 refills | Status: AC
  Filled 2023-11-17: qty 100, 30d supply, fill #0

## 2023-11-17 MED ORDER — CEFADROXIL 500 MG PO CAPS
1000.0000 mg | ORAL_CAPSULE | Freq: Two times a day (BID) | ORAL | 0 refills | Status: DC
  Filled 2023-11-17: qty 168, 42d supply, fill #0

## 2023-11-17 MED ORDER — LANCETS 30G MISC
1.0000 | Freq: Three times a day (TID) | 0 refills | Status: AC
  Filled 2023-11-17: qty 100, 30d supply, fill #0

## 2023-11-17 MED ORDER — ATORVASTATIN CALCIUM 80 MG PO TABS
80.0000 mg | ORAL_TABLET | Freq: Every day | ORAL | 0 refills | Status: DC
  Filled 2023-11-17: qty 30, 30d supply, fill #0

## 2023-11-17 MED ORDER — VALSARTAN-HYDROCHLOROTHIAZIDE 160-25 MG PO TABS
1.0000 | ORAL_TABLET | Freq: Every day | ORAL | 0 refills | Status: DC
  Filled 2023-11-17: qty 30, 30d supply, fill #0

## 2023-11-17 MED ORDER — CEFADROXIL 500 MG PO CAPS
1000.0000 mg | ORAL_CAPSULE | Freq: Two times a day (BID) | ORAL | Status: DC
  Administered 2023-11-17 – 2023-11-18 (×2): 1000 mg via ORAL
  Filled 2023-11-17 (×2): qty 2

## 2023-11-17 MED ORDER — INSULIN GLARGINE 100 UNIT/ML SOLOSTAR PEN
25.0000 [IU] | PEN_INJECTOR | Freq: Every day | SUBCUTANEOUS | 0 refills | Status: DC
  Filled 2023-11-17: qty 15, 60d supply, fill #0

## 2023-11-17 MED ORDER — BLOOD GLUCOSE TEST VI STRP
1.0000 | ORAL_STRIP | Freq: Three times a day (TID) | 0 refills | Status: AC
  Filled 2023-11-17: qty 100, 30d supply, fill #0

## 2023-11-17 MED ORDER — ASCORBIC ACID 1000 MG PO TABS
1000.0000 mg | ORAL_TABLET | Freq: Every day | ORAL | 0 refills | Status: DC
  Filled 2023-11-17: qty 30, 30d supply, fill #0

## 2023-11-17 NOTE — Progress Notes (Signed)
     Daily Progress Note Intern Pager: [phone]  Patient name: Sally Mclaughlin Medical record number: [id number] Date of birth: [birth year] Age: 58 y.o. Gender: female  Primary Care Provider: Cleotilde Lukes, DO Consultants: Hand Surgery Code Status: Full Code  Pt Overview and Major Events to Date:  6/27: patient admitted, I&D per Hand Surgery  Medical Decision Making:  Sally Mclaughlin is a 57 year old female with cellulitis and osteomyelitis of the right index finger. Pertinent PMH/PSH includes T2DM, HTN, and HLD.  Assessment & Plan Osteomyelitis of finger [facility]) Patient undergoing hydrotherapy daily. R index finger bandaged and dry.  - Hand surgery following, appreciate recommendations - Hydrotherapy per PT, Oxy 10 mg PRN w/ hydrotherapy for breakthrough pain - Pain reg: Tylenol q6h prn for mild pain, Hydrocodone-Acetaminophen 5-325 mg q4h prn for moderate pain - Follow surgical wound cultures - Amoxicillin 500 TID (7/1) while inpatient and switch to Amoxicillin 875 BID upon discharge to end 8/8 (not on formulary) - AM CBC, BMP -- ID consult for antibiotic coverage  - fluconazole  400 mg for 6-12 months and cefadroxil 1,000 mg BID for 6 weeks T2DM (type 2 diabetes mellitus) (HCC) A1c >15.5.  Sugars have been difficult to control while inpatient, CBG 230s-310s. - Continue Semglee  25u daily - Moderate SSI ACHS - Lipitor 80 mg daily HTN (hypertension) BP elevated this morning to 182/84 prior to daily BP meds. 144/71 after medications were given.   - Irbesartan 300 mg daily - Continue home HCTZ 25 mg daily  -- Amlodipine  10 mg daily - Consider combo med (Valsartan/HCTZ) at discharge for better compliance Chronic health problem HLD: atorvastatin  80mg      FEN/GI: Carb modified PPx: Lovenox Dispo:Home today.   Subjective:  Patient is seen lying comfortably in bed this morning. States she ate a good breakfast. She has no complaints or concerns at this time.    Objective: Temp:  [97.6 F (36.4 C)-98.3 F (36.8 C)] 98.3 F (36.8 C) (07/02 0739) Pulse Rate:  [67-80] 80 (07/02 0739) Resp:  [16-18] 18 (07/02 0739) BP: (143-186)/(71-90) 143/83 (07/02 0739) SpO2:  [97 %-100 %] 99 % (07/02 0739) Physical Exam: General: Alert and awake, NAD Cardiovascular: RRR, systolic murmur Respiratory: CTAB, normal work of breathing on room air.  Abdomen: Bowel sounds present, soft, flat, non-tender. Extremities: R index finger is wrapped and dry.   Laboratory: Most recent CBC Lab Results  Component Value Date   WBC 6.1 11/16/2023   HGB 11.4 (L) 11/16/2023   HCT 33.1 (L) 11/16/2023   MCV 89.0 11/16/2023   PLT 259 11/16/2023   Most recent BMP    Latest Ref Rng & Units 11/16/2023    3:25 AM  BMP  Glucose 70 - 99 mg/dL 718   BUN 6 - 20 mg/dL 30   Creatinine 9.55 - 1.00 mg/dL 9.04   Sodium 864 - 854 mmol/L 134   Potassium 3.5 - 5.1 mmol/L 4.2   Chloride 98 - 111 mmol/L 101   CO2 22 - 32 mmol/L 26   Calcium  8.9 - 10.3 mg/dL 9.1       Sally Raguel MATSU, DO 11/17/2023, 8:10 AM  PGY-1, [place] [facility] Medicine FPTS Intern pager: [phone], text pages welcome Secure chat group [facility] Teaching Service

## 2023-11-17 NOTE — TOC Initial Note (Signed)
 Transition of Care [facility]) - Initial/Assessment Note    Patient Details  Name: Sally Mclaughlin MRN: [id number] Date of Birth: Mar 01, 1966  Transition of [facility]) CM/SW Contact:    Rosalva Jon Bloch, RN Phone Number: 11/17/2023, 4:22 PM  Clinical Narrative:                 NCM received secured chat from UR regarding ? Workman's compensation. NCM spoke pt to obtain claim #. Pt states she doesn't have claim # or who to call. Face sheet indicates WC- [employer] as Magazine features editor. NCM called [employer] and was provided with an adjuster name, Montie ORN. [phone]). Montie note available, NCM spoke with the operator. Operator shared [employer] has no new claim on pt . States last claim was 2005 and it was back related. Directed NCM to call employee. NCM called employee health, employee health stated pt needs to call Health @ work and make a report. Information provide to pt. Pt stated she will call employee health.  Expected Discharge Plan: Home/Self Care Barriers to Discharge: Continued Medical Work up   Patient Goals and CMS Choice            Expected Discharge Plan and Services   Discharge Planning Services: CM Consult   Living arrangements for the past 2 months: Single Family Home                                      Prior Living Arrangements/Services Living arrangements for the past 2 months: Single Family Home   Patient language and need for interpreter reviewed:: Yes Do you feel safe going back to the place where you live?: Yes      Need for Family Participation in Patient Care: Yes (Comment) Care giver support system in place?: Yes (comment)   Criminal Activity/Legal Involvement Pertinent to Current Situation/Hospitalization: No - Comment as needed  Activities of Daily Living   ADL Screening (condition at time of admission) Independently performs ADLs?: Yes (appropriate for developmental age) Is the patient deaf or have difficulty hearing?: No Does the  patient have difficulty seeing, even when wearing glasses/contacts?: No Does the patient have difficulty concentrating, remembering, or making decisions?: No  Permission Sought/Granted                  Emotional Assessment       Orientation: : Oriented to Self, Oriented to Place, Oriented to  Time, Oriented to Situation      Admission diagnosis:  Hyperglycemia [R73.9] Finger infection [L08.9] Patient Active Problem List   Diagnosis Date Noted   Polymicrobial bacterial infection 11/17/2023   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   Finger infection 11/17/2023   Chronic health problem 11/16/2023   Tinea pedis 11/13/2023   Osteomyelitis of finger (HCC) 11/12/2023   Neuropathy 10/20/2022   ACEI/ARB contraindicated 08/26/2022   Yeast infection of the vagina 07/18/2022   Adjustment disorder 11/21/2019   Murmur, cardiac 11/21/2019   Splenic lesion 11/03/2019   Pancreatitis, alcoholic, acute 10/24/2019   Cortical age-related cataract of both eyes 09/26/2019   Dermatochalasis of both upper eyelids 09/26/2019   Hidradenitis axillaris 08/18/2018   Pain in joint, ankle and foot 08/18/2018   Epigastric pain 06/15/2018   Carpal tunnel syndrome 11/12/2015   TOBACCO USER 02/23/2009   Hyperlipidemia 07/23/2008   T2DM (type 2 diabetes mellitus) (HCC) 05/14/2008   Overweight 07/15/2006   Alcohol abuse 07/15/2006  HTN (hypertension) 07/15/2006   PCP:  Cleotilde Lukes, DO Pharmacy:   [facility] [address], [address] [place] [address] Phone: [phone] Fax: [phone]  [facility] [address] [place] [address] Phone: [phone] Fax: [phone]  [facility] [address] [place] [address] Phone: [phone] Fax: [phone]     Social Drivers of Health (SDOH) Social History: SDOH Screenings   Food Insecurity: Food  Insecurity Present (11/13/2023)  Housing: High Risk (11/13/2023)  Transportation Needs: No Transportation Needs (11/13/2023)  Utilities: Not At Risk (11/13/2023)  Depression (PHQ2-9): Medium Risk (12/10/2022)  Tobacco Use: High Risk (11/12/2023)   SDOH Interventions:     Readmission Risk Interventions     No data to display

## 2023-11-17 NOTE — Assessment & Plan Note (Addendum)
 Patient undergoing hydrotherapy daily. R index finger bandaged and dry.  - Hand surgery following, appreciate recommendations - Hydrotherapy per PT, Oxy 10 mg PRN w/ hydrotherapy for breakthrough pain - Pain reg: Tylenol q6h prn for mild pain, Hydrocodone-Acetaminophen 5-325 mg q4h prn for moderate pain - Follow surgical wound cultures - Amoxicillin 500 TID (7/1) while inpatient and switch to Amoxicillin 875 BID upon discharge to end 8/8 (not on formulary) - AM CBC, BMP -- ID consult for antibiotic coverage  - fluconazole  400 mg for 6-12 months and cefadroxil 1,000 mg BID for 6 weeks

## 2023-11-17 NOTE — Plan of Care (Signed)
  Problem: Education: Goal: Ability to describe self-care measures that may prevent or decrease complications (Diabetes Survival Skills Education) will improve Outcome: Progressing Goal: Individualized Educational Video(s) Outcome: Progressing   Problem: Coping: Goal: Ability to adjust to condition or change in health will improve Outcome: Progressing   Problem: Fluid Volume: Goal: Ability to maintain a balanced intake and output will improve Outcome: Progressing   Problem: Health Behavior/Discharge Planning: Goal: Ability to identify and utilize available resources and services will improve Outcome: Progressing Goal: Ability to manage health-related needs will improve Outcome: Progressing   Problem: Nutritional: Goal: Maintenance of adequate nutrition will improve Outcome: Progressing Goal: Progress toward achieving an optimal weight will improve Outcome: Progressing   Problem: Education: Goal: Knowledge of General Education information will improve Description: Including pain rating scale, medication(s)/side effects and non-pharmacologic comfort measures Outcome: Progressing   Problem: Tissue Perfusion: Goal: Adequacy of tissue perfusion will improve Outcome: Progressing   Problem: Clinical Measurements: Goal: Ability to maintain clinical measurements within normal limits will improve Outcome: Progressing Goal: Will remain free from infection Outcome: Progressing Goal: Diagnostic test results will improve Outcome: Progressing Goal: Respiratory complications will improve Outcome: Progressing Goal: Cardiovascular complication will be avoided Outcome: Progressing   Problem: Coping: Goal: Level of anxiety will decrease Outcome: Progressing   Problem: Safety: Goal: Ability to remain free from injury will improve Outcome: Progressing   Problem: Skin Integrity: Goal: Risk for impaired skin integrity will decrease Outcome: Progressing

## 2023-11-17 NOTE — Inpatient Diabetes Management (Addendum)
 Inpatient Diabetes Program Recommendations  AACE/ADA: New Consensus Statement on Inpatient Glycemic Control (2015)  Target Ranges:  Prepandial:   less than 140 mg/dL      Peak postprandial:   less than 180 mg/dL (1-2 hours)      Critically ill patients:  140 - 180 mg/dL   Lab Results  Component Value Date   GLUCAP 181 (H) 11/17/2023   HGBA1C >15.5 (H) 11/12/2023    Review of Glycemic Control  Diabetes history: DM 2 Outpatient Diabetes medications: None Current orders for Inpatient glycemic control:  Semglee  25 units Daily Novolog  0-20 units tid + hs A1c >15.5%  Inpatient Diabetes Program Recommendations:    Discharge Recommendations: Long acting recommendations: Insulin  Glargine (BASAGLAR ) Kwikpen (use Lilly coupon) 25 units Daily  Supply/Referral recommendations: Glucometer Test strips Lancet device Lancets Pen needles - standard   Use Adult Diabetes Insulin  Treatment Post Discharge order set.   Spoke with pt again regarding A1c and to see if patient has decided to use insulin  at home. Pt reports she feels insulin  would be a good option for her to control her glucose at home to heal her finger. Pt walked me through the steps of operating insulin  pen.     Thanks,  [name] RN, MSN, BC-ADM Inpatient Diabetes Coordinator Team Pager [phone] (8a-5p)

## 2023-11-17 NOTE — Assessment & Plan Note (Signed)
 A1c >15.5.  Sugars have been difficult to control while inpatient, CBG 230s-310s. - Continue Semglee  25u daily - Moderate SSI ACHS - Lipitor 80 mg daily

## 2023-11-17 NOTE — Consult Note (Signed)
 [facility]    Date of Admission:  11/11/2023     Total days of antibiotics 7               Reason for Consult: Osteomyelitis   Referring Provider: Family Medicine Primary Care Provider: Cleotilde Lukes, DO   ASSESSMENT:  Sally Mclaughlin is a 58 year old African-American female presenting with right index finger swelling and found to have right index finger infection with osteomyelitis status post debridement.  Sally Mclaughlin is POD #5 and tolerating linezolid and fluconazole .  Discussed recommended plan of care to change antibiotics to cefadroxil 1 g p.o. twice daily and continue fluconazole  400 mg daily.  Will need a prolonged course of antibiotics for bacterial osteomyelitis and fungal osteomyelitis.  Obtain EKG to check baseline QTc while on fluconazole  for therapeutic drug monitoring.  Postoperative wound care per orthopedics.  Standard/universal precautions.  Remaining medical and supportive care per family medicine.  PLAN:  Change antibiotics to cefadroxil 1 g p.o. twice daily and continue with fluconazole  400 mg daily. Postoperative wound care per orthopedics. Obtain EKG for therapeutic drug monitoring of QTc. Standard/universal precautions. Remaining medical and supportive care per family medicine.   Principal Problem:   Osteomyelitis of finger (HCC) Active Problems:   T2DM (type 2 diabetes mellitus) (HCC)   HTN (hypertension)   Murmur, cardiac   Tinea pedis   Chronic health problem    amLODipine   10 mg Oral q morning   vitamin C  1,000 mg Oral Daily   atorvastatin   80 mg Oral Daily   enoxaparin (LOVENOX) injection  40 mg Subcutaneous Q24H   fluconazole   400 mg Oral Daily   hydrochlorothiazide   25 mg Oral Daily   insulin  aspart  0-20 Units Subcutaneous TID WC   insulin  aspart  0-5 Units Subcutaneous QHS   insulin  glargine-yfgn  25 Units Subcutaneous Daily   irbesartan  300 mg Oral Daily   linezolid  600 mg Oral Q12H   sodium  chloride flush  10-40 mL Intracatheter Q12H     HPI: Sally Mclaughlin is a 58 y.o. female with previous medical history of diabetes and hypertension presenting to with swelling of the right index finger.    Sally Mclaughlin was initially seen at [facility] on 11/11/23 with 2 week history of right index finger swelling in the absence of any trauma/injury and was referred to the ED. Afebrile with no leukocytosis on arrival.  X-ray right index finger with no radiographic findings to suggest osteomyelitis and no acute displaced fracture or dislocation.  MRI right fingers with abnormal edema and enhancement in the distal phalanx of the index finger suspicious for osteomyelitis; and subcutaneous edema and enhancement throughout the left index finger suspicious for cellulitis with no drainable abscesses.  Brought to the OR on 11/12/2023 for incision and drainage of the right index finger including bone cortex.  No gross purulence was encountered with cultures obtained.  Surgical specimens growing group B streptococcus, candida albicans, and Staphylococcus aureus with the other cultures growing Staph aureus, group B streptococcus and Staphylococcus warneri (sensitive).  Initially started on ampicillin-sulbactam and vancomycin with fluconazole  added for Candida infection.  Antibiotics narrowed to linezolid and fluconazole .  ID asked for antibiotic recommendations.   Review of Systems: Review of Systems  Constitutional:  Negative for chills, fever and weight loss.  Respiratory:  Negative for cough, shortness of breath and wheezing.   Cardiovascular:  Negative for chest pain and leg swelling.  Gastrointestinal:  Negative  for abdominal pain, constipation, diarrhea, nausea and vomiting.  Skin:  Negative for rash.     Past Medical History:  Diagnosis Date   Diabetes mellitus without complication (HCC)    Hypercholesteremia    Hypertension    Suprapubic abscess 09/06/2019    Social History   Tobacco Use   Smoking  status: Light Smoker    Types: Cigarettes   Smokeless tobacco: Never  Vaping Use   Vaping status: Never Used  Substance Use Topics   Alcohol use: Yes    Comment: social   Drug use: No    Family History  Problem Relation Age of Onset   Ovarian cancer Sister    Colon cancer Neg Hx    Colon polyps Neg Hx    Esophageal cancer Neg Hx    Stomach cancer Neg Hx    Rectal cancer Neg Hx     No Known Allergies  OBJECTIVE: Blood pressure (!) 143/83, pulse 80, temperature 98.3 F (36.8 C), temperature source Oral, resp. rate 18, height 5' 6 (1.676 m), weight 72.6 kg, last menstrual period 11/25/2012, SpO2 99%.  Physical Exam Constitutional:      General: She is not in acute distress.    Appearance: She is well-developed.  Cardiovascular:     Rate and Rhythm: Normal rate and regular rhythm.     Heart sounds: Normal heart sounds.  Pulmonary:     Effort: Pulmonary effort is normal.     Breath sounds: Normal breath sounds.  Musculoskeletal:     Comments: Surgical dressing clean an dry  Skin:    General: Skin is warm and dry.  Neurological:     Mental Status: She is alert and oriented to person, place, and time.  Psychiatric:        Mood and Affect: Mood normal.     Lab Results Lab Results  Component Value Date   WBC 6.1 11/16/2023   HGB 11.4 (L) 11/16/2023   HCT 33.1 (L) 11/16/2023   MCV 89.0 11/16/2023   PLT 259 11/16/2023    Lab Results  Component Value Date   CREATININE 0.95 11/16/2023   BUN 30 (H) 11/16/2023   NA 134 (L) 11/16/2023   K 4.2 11/16/2023   CL 101 11/16/2023   CO2 26 11/16/2023    Lab Results  Component Value Date   ALT 27 10/25/2019   AST 24 10/25/2019   ALKPHOS 102 10/25/2019   BILITOT 1.1 10/25/2019     Microbiology: Recent Results (from the past 240 hours)  Culture, blood (Routine X 2) w Reflex to ID Panel     Status: None   Collection Time: 11/12/23  4:48 AM   Specimen: BLOOD  Result Value Ref Range Status   Specimen Description  BLOOD SITE NOT SPECIFIED  Final   Special Requests   Final    BOTTLES DRAWN AEROBIC AND ANAEROBIC Blood Culture results may not be optimal due to an inadequate volume of blood received in culture bottles   Culture   Final    NO GROWTH 5 DAYS Performed at [facility], [address] [place], [place] [address]    Report Status 11/17/2023 FINAL  Final  Culture, blood (Routine X 2) w Reflex to ID Panel     Status: None   Collection Time: 11/12/23  5:53 AM   Specimen: BLOOD LEFT HAND  Result Value Ref Range Status   Specimen Description BLOOD LEFT HAND  Final   Special Requests   Final  BOTTLES DRAWN AEROBIC AND ANAEROBIC Blood Culture adequate volume   Culture   Final    NO GROWTH 5 DAYS Performed at [facility], [address] [place], [place] [address]    Report Status 11/17/2023 FINAL  Final  Aerobic/Anaerobic Culture w Gram Stain (surgical/deep wound)     Status: None (Preliminary result)   Collection Time: 11/12/23  6:25 PM   Specimen: Wound  Result Value Ref Range Status   Specimen Description WOUND  Final   Special Requests RIGHT INDEX FINGER  Final   Gram Stain NO WBC SEEN RARE GRAM POSITIVE COCCI IN PAIRS   Final   Culture   Final    MODERATE GROUP B STREP(S.AGALACTIAE)ISOLATED TESTING AGAINST S. AGALACTIAE NOT ROUTINELY PERFORMED DUE TO PREDICTABILITY OF AMP/PEN/VAN SUSCEPTIBILITY. RARE CANDIDA ALBICANS RARE STAPHYLOCOCCUS AUREUS NO ANAEROBES ISOLATED; CULTURE IN PROGRESS FOR 5 DAYS    Report Status PENDING  Incomplete   Organism ID, Bacteria STAPHYLOCOCCUS AUREUS  Final      Susceptibility   Staphylococcus aureus - MIC*    CIPROFLOXACIN <=0.5 SENSITIVE Sensitive     ERYTHROMYCIN <=0.25 SENSITIVE Sensitive     GENTAMICIN <=0.5 SENSITIVE Sensitive     OXACILLIN 0.5 SENSITIVE Sensitive     TETRACYCLINE <=1 SENSITIVE Sensitive     VANCOMYCIN <=0.5 SENSITIVE Sensitive     TRIMETH /SULFA  <=10 SENSITIVE Sensitive     CLINDAMYCIN  <=0.25 SENSITIVE  Sensitive     RIFAMPIN <=0.5 SENSITIVE Sensitive     Inducible Clindamycin  NEGATIVE Sensitive     LINEZOLID Value in next row Sensitive      2 SENSITIVEPerformed at [facility], [address], [place], [place] [address]    * RARE STAPHYLOCOCCUS AUREUS  Aerobic/Anaerobic Culture w Gram Stain (surgical/deep wound)     Status: None (Preliminary result)   Collection Time: 11/12/23  6:30 PM   Specimen: Bone; Tissue  Result Value Ref Range Status   Specimen Description BONE  Final   Special Requests RIGHT INDEX FINGER  Final   Gram Stain   Final    NO WBC SEEN NO ORGANISMS SEEN Performed at [facility], [address] [place], [place] [address]    Culture   Final    FEW GROUP B STREP(S.AGALACTIAE)ISOLATED TESTING AGAINST S. AGALACTIAE NOT ROUTINELY PERFORMED DUE TO PREDICTABILITY OF AMP/PEN/VAN SUSCEPTIBILITY. RARE STAPHYLOCOCCUS WARNERI CRITICAL RESULT CALLED TO, READ BACK BY AND VERIFIED WITH: 5N CHARGE RN DAUDA C. [id number] AT 1932, ADC NO ANAEROBES ISOLATED; CULTURE IN PROGRESS FOR 5 DAYS    Report Status PENDING  Incomplete    I have personally spent 32 minutes involved in face-to-face and non-face-to-face activities for this patient on the day of the visit. Professional time spent includes the following activities: preparing to see the patient (review of tests), obtaining and reviewing separately obtained history (admission/discharge record), performing a medically appropriate examination, ordering medications, communicating with other health care professionals, documenting clinical information in the EMR, communicating results and counseling patient  regarding medication and plan of care, and care coordination.    Greg Demitris Pokorny, NP [facility]  11/17/2023  9:00 AM

## 2023-11-17 NOTE — Assessment & Plan Note (Signed)
 HLD: atorvastatin  80mg 

## 2023-11-17 NOTE — Discharge Summary (Cosign Needed Addendum)
 Family Medicine Teaching [facility] Discharge Summary  Patient name: Sally Mclaughlin Medical record number: [id number] Date of birth: Oct 01, 1965 Age: 58 y.o. Gender: female Date of Admission: 11/11/2023  Date of Discharge: 11/18/2023 Admitting Physician: Camie LITTIE Mulch, MD  Primary Care Provider: Cleotilde Lukes, DO Consultants: Hand Surgery  Indication for Hospitalization: Osteomyelitis of the right index finger    Discharge Diagnoses/Problem List:  Principal Problem for Admission: Osteomyelitis of the right index finger Other Problems addressed during stay:  Principal Problem:   Osteomyelitis of finger (HCC) Active Problems:   T2DM (type 2 diabetes mellitus) (HCC)   HTN (hypertension)   Murmur, cardiac   Tinea pedis   Chronic health problem   Polymicrobial bacterial infection   Fungal osteomyelitis (HCC)   Finger infection   Brief Hospital Course:  KHRYSTAL Mclaughlin is a 58 y.o.female with a history of T2DM, who was admitted to the [facility] Medicine Teaching Service at [facility] for osteomyelitis of R index finger. Her hospital course is detailed below:  Osteomyelitis of R Index Finger Right infected index finger, MRI confirmed findings of cellulitis and osteomyelitis. Hand surgery consulted in the ED and performed I&D with cultures.  No leukocytosis and patient remained afebrile throughout hospitalization.  Recommended hydrotherapy s/p I&D, which she tolerated well. Treated with Vancomycin (6/27 - 6/30) and transitioned to Amoxicillin 500 TID (7/1) while inpatient due to culture growing GBS. Wound culture showed MSSA and candida species 7/1. ID consulted. Amoxicillin switched to cefodroxil 1,000 mg BID (7/1-8/13) 6 week course and added fluconazole  400 mg (7/1-) with plan for 6 months to 1 year duration per ID.  Was also briefly on linezolid 600 mg Q12h (7/1-7/2), but discontinued. Discharged with follow-up with Infectious Disease.  T2DM Uncontrolled,  A1c>15.5.  Started on LAI and  moderate SSI ACHS.  Titrated insulin  up and CBGs improved to mid 100s.  She was discharged on 25 units Semglee  daily with insulin  kit/CBG monitoring.  Also started on atorvastatin  80 mg daily.  HTN BP poorly controlled with SBPs in 180s while inpatient, not taking outpatient amlodipine  past few months. Started on irbesartan 300 mg, hydrochlorothiazide  25 mg, and and added amlodipine  10 mg daily, then transitioned to combo medicine at discharge with Valsartan/hydrochlorothiazide  160-25 mg and continued amlodipine .  SBP had improved to 140s by time of discharge.  PCP Follow-up Recommendations: Patient will continue on cefadroxil 1 g BID for 6 weeks and fluconazole  400 mg daily for 6 months-1 year per ID recommendations Recheck QTc on fluconazole  with EKG at follow ups as appropriate ID follow up in 12/29/2023 scheduled A1c >15.5, discharged on 25 units Semglee  daily with insulin  kit/CBG monitoring equipment, follow up adherence and adjust as needed based on CBGs Recheck BMP and BP, titrate BP meds as appropriate    Results/Tests Pending at Time of Discharge:  Unresulted Labs (From admission, onward)    None      Disposition: Home  Discharge Condition: Stable, CBGs normalized, BP improved, finger with improving appearance  Discharge Exam:  Vitals:   11/17/23 0739 11/17/23 1644  BP: (!) 143/83 (!) 149/72  Pulse: 80 81  Resp: 18 18  Temp: 98.3 F (36.8 C) (!) 96.2 F (35.7 C)  SpO2: 99% 95%   General: Alert and awake, no acute distress  Cardiovascular: RRR, 3/6 holosystolic murmur  Respiratory: CTAB, normal work of breathing on room air, no crackles, rhonchi, or rales Abdomen: Bowel sounds present in all 4 quadrants, soft, flat, non-tender Extremities: R index finger is wrapped  and dry, no drainage from bandages, no surrounding edema.   Significant Procedures: I&D of right index finger on 6/27  Significant Labs and Imaging:  Recent Labs  Lab 11/16/23 0325  WBC 6.1  HGB  11.4*  HCT 33.1*  PLT 259   Recent Labs  Lab 11/16/23 0325  NA 134*  K 4.2  CL 101  CO2 26  GLUCOSE 281*  BUN 30*  CREATININE 0.95  CALCIUM  9.1  MG 1.9    Pertinent Imaging   -MRI of the right fingers without and with contrast  Abnormal edema and enhancement of the index finger, suspicious for osteomyelitis.  Subcutaneous edema and enhancement throughout the left index finger, suspicious for cellulitis.  Small degenerative subcortical cyst along the fifth metacarpal head, similar lesion or small erosion along proximal small finger.  Discharge Medications:  Allergies as of 11/17/2023   No Known Allergies      Medication List     TAKE these medications    acetaminophen 500 MG tablet Commonly known as: TYLENOL Take 1,000 mg by mouth every 6 (six) hours as needed for mild pain (pain score 1-3) or headache.   amLODipine  10 MG tablet Commonly known as: NORVASC  Take 1 tablet (10 mg total) by mouth every morning. Start taking on: November 18, 2023 What changed:  medication strength how much to take when to take this   ascorbic acid 1000 MG tablet Commonly known as: VITAMIN C Take 1 tablet (1,000 mg total) by mouth daily. Start taking on: November 18, 2023   atorvastatin  80 MG tablet Commonly known as: LIPITOR Take 1 tablet (80 mg total) by mouth daily. Start taking on: November 18, 2023   Blood Glucose Monitor System w/Device Kit Use 3 (three) times daily.   BLOOD GLUCOSE TEST STRIPS Strp Use as directed to check blood sugar 3 (three) times daily.   cefadroxil 500 MG capsule Commonly known as: DURICEF Take 2 capsules (1,000 mg total) by mouth 2 (two) times daily.   fluconazole  200 MG tablet Commonly known as: DIFLUCAN  Take 2 tablets (400 mg total) by mouth daily. Start taking on: November 18, 2023   insulin  glargine 100 UNIT/ML Solostar Pen Commonly known as: LANTUS  Inject 25 Units into the skin daily.   Insulin  Pen Needle 32G X 4 MM Misc Use 1 each 3 (three) times  daily.   Lancet Device Misc Use 3 (three) times daily.   Lancets 30G Misc Use 1 each 3 (three) times daily.   valsartan-hydrochlorothiazide  160-25 MG tablet Commonly known as: DIOVAN-HCT Take 1 tablet by mouth daily.               Discharge Care Instructions  (From admission, onward)           Start     Ordered   11/17/23 0000  Discharge wound care:       Comments: Change gentle wrap bandage daily, keep clean.  If getting wet in shower or sink, pat dry gently and cover with gentle bandage.   11/17/23 1717            Discharge Instructions: Please refer to Patient Instructions section of EMR for full details.  Patient was counseled important signs and symptoms that should prompt return to medical care, changes in medications, dietary instructions, activity restrictions, and follow up appointments.   Follow-Up Appointments: Future Appointments  Date Time Provider Department Center  11/22/2023  1:55 PM Toma Matas, MD [facility]  12/29/2023  2:00 PM Fleeta Rothman, [place]  LOISE, MD [facility]     Ronnald Lee, DO 11/17/2023, 6:31 PM PGY-1, [facility]   I have verified that the resident's  findings are accurately documented in the resident's note. I have made edits and changes where appropriate, and agree with plan.  Ozell Provencal, MD, PGY-3 [facility] Family Medicine 8:23 AM 11/18/2023

## 2023-11-17 NOTE — Progress Notes (Signed)
       Date: 11/17/2023  Patient name: Sally Mclaughlin  Medical record number: [id number]  Date of birth: June 29, 1965   QT/QTcB 398/447  We will sign off.  Please call with further questions.    Jomarie Fleeta Rothman 11/17/2023, 4:25 PM

## 2023-11-17 NOTE — Progress Notes (Signed)
 Pt will have to stay until morning for discharge for wound care teaching and to get prescriptions for TOC.  Social worker to follow up with Workers Comp paperwork for discharge.

## 2023-11-17 NOTE — Discharge Instructions (Addendum)
    Dear Sally Mclaughlin,  Thank you for letting us  participate in your care. You were hospitalized for an infection in your finger and diagnosed with Osteomyelitis of finger (HCC). You were treated with IV antibiotics, surgery, and then you were placed on oral antibiotics.  POST-HOSPITAL & CARE INSTRUCTIONS Please return to care if you are unable to take your antibiotics for some reason. If you have new redness swelling or pain in your finger please seek care. Go to your follow up appointments (listed below)   DOCTOR'S APPOINTMENT   Future Appointments  Date Time Provider Department Center  11/22/2023  1:55 PM Toma Matas, MD [facility] [place]  12/29/2023  2:00 PM Fleeta Rothman, Jomarie SAILOR, MD [facility]    Follow-up Information     Murrell Drivers, MD. Call.   Specialty: Orthopedic Surgery Contact information: [address] [name] [address] [phone]             Take care and be well!  Family Medicine Teaching Service Inpatient Team [place]  [facility]  [address] [place], [place] [address] [phone]

## 2023-11-17 NOTE — Assessment & Plan Note (Signed)
 BP elevated this morning to 182/84 prior to daily BP meds. 144/71 after medications were given.   - Irbesartan 300 mg daily - Continue home HCTZ 25 mg daily  -- Amlodipine  10 mg daily - Consider combo med (Valsartan/HCTZ) at discharge for better compliance

## 2023-11-18 ENCOUNTER — Other Ambulatory Visit (HOSPITAL_COMMUNITY): Payer: Self-pay

## 2023-11-18 ENCOUNTER — Other Ambulatory Visit: Payer: Self-pay

## 2023-11-18 DIAGNOSIS — R739 Hyperglycemia, unspecified: Secondary | ICD-10-CM

## 2023-11-18 LAB — GLUCOSE, CAPILLARY
Glucose-Capillary: 134 mg/dL — ABNORMAL HIGH (ref 70–99)
Glucose-Capillary: 116 mg/dL — ABNORMAL HIGH (ref 70–99)

## 2023-11-18 NOTE — TOC Transition Note (Signed)
 Transition of Care [facility]) - Discharge Note   Patient Details  Name: Sally Mclaughlin MRN: [id number] Date of Birth: 01-Aug-1965  Transition of Care [facility]) CM/SW Contact:  Rosalva Jon Bloch, RN Phone Number: 11/18/2023, 10:25 AM   Clinical Narrative:    Patient will DC to: home Anticipated DC date: 11/18/2023 Family notified: yes Transport by: car          Osteomyelitis of finger  Per MD patient ready for DC today. RN, patient, and patient's family aware of d/c plan. Pt from home with grandson. Pt independent with ADL' s , no DME noted.   NCM spoke with Ren/Clinical Consultation for [facility] ,[phone]. Ren provided NCM with workman's compensation claim # [id number]. States claim adjuster hasn't been assigned however will f/u with pt by Monday.  Pt with post hospital f/u noted on AVS.  Pt without family /friend to assist with transportation to home. D/c lounge to assist with TAXI voucher. Pt without RX med concerns.  RNCM will sign off for now as intervention is no longer needed. Please consult us  again if new needs arise.    Final next level of care: Home/Self Care Barriers to Discharge: No Barriers Identified   Patient Goals and CMS Choice            Discharge Placement                       Discharge Plan and Services Additional resources added to the After Visit Summary for     Discharge Planning Services: CM Consult                                 Social Drivers of Health (SDOH) Interventions SDOH Screenings   Food Insecurity: Food Insecurity Present (11/13/2023)  Housing: High Risk (11/13/2023)  Transportation Needs: No Transportation Needs (11/13/2023)  Utilities: Not At Risk (11/13/2023)  Depression (PHQ2-9): Medium Risk (12/10/2022)  Tobacco Use: High Risk (11/12/2023)     Readmission Risk Interventions     No data to display

## 2023-11-18 NOTE — Progress Notes (Signed)
 DISCHARGE NOTE HOME MAILEY LANDSTROM to be discharged Home per MD order. Discussed prescriptions and follow up appointments with the patient. Prescriptions given to patient; medication list explained in detail. Patient verbalized understanding.  Skin clean, dry and intact without evidence of skin break down, no evidence of skin tears noted. IV catheter discontinued intact. Site without signs and symptoms of complications. Dressing and pressure applied. Pt denies pain at the site currently. No complaints noted.  Patient free of lines, drains, and wounds.   An After Visit Summary (AVS) was printed and given to the patient. Patient escorted via wheelchair, and discharged home via private auto.  Peyton SHAUNNA Pepper, RN

## 2023-11-18 NOTE — Plan of Care (Signed)
  Problem: Education: Goal: Ability to describe self-care measures that may prevent or decrease complications (Diabetes Survival Skills Education) will improve Outcome: Progressing   Problem: Coping: Goal: Ability to adjust to condition or change in health will improve Outcome: Progressing   Problem: Fluid Volume: Goal: Ability to maintain a balanced intake and output will improve Outcome: Progressing   Problem: Health Behavior/Discharge Planning: Goal: Ability to identify and utilize available resources and services will improve Outcome: Progressing Goal: Ability to manage health-related needs will improve Outcome: Progressing   Problem: Metabolic: Goal: Ability to maintain appropriate glucose levels will improve Outcome: Progressing   Problem: Nutritional: Goal: Maintenance of adequate nutrition will improve Outcome: Progressing Goal: Progress toward achieving an optimal weight will improve Outcome: Progressing   Problem: Skin Integrity: Goal: Risk for impaired skin integrity will decrease Outcome: Progressing   Problem: Tissue Perfusion: Goal: Adequacy of tissue perfusion will improve Outcome: Progressing   Problem: Health Behavior/Discharge Planning: Goal: Ability to manage health-related needs will improve Outcome: Progressing   Problem: Clinical Measurements: Goal: Ability to maintain clinical measurements within normal limits will improve Outcome: Progressing Goal: Will remain free from infection Outcome: Progressing Goal: Diagnostic test results will improve Outcome: Progressing Goal: Respiratory complications will improve Outcome: Progressing Goal: Cardiovascular complication will be avoided Outcome: Progressing   Problem: Activity: Goal: Risk for activity intolerance will decrease Outcome: Progressing   Problem: Coping: Goal: Level of anxiety will decrease Outcome: Progressing   Problem: Elimination: Goal: Will not experience complications  related to bowel motility Outcome: Progressing Goal: Will not experience complications related to urinary retention Outcome: Progressing   Problem: Pain Managment: Goal: General experience of comfort will improve and/or be controlled Outcome: Progressing   Problem: Skin Integrity: Goal: Risk for impaired skin integrity will decrease Outcome: Progressing

## 2023-11-22 ENCOUNTER — Ambulatory Visit: Payer: Self-pay | Admitting: Family Medicine

## 2023-11-22 ENCOUNTER — Encounter: Payer: Self-pay | Admitting: Family Medicine

## 2023-11-22 VITALS — BP 136/78 | HR 77 | Ht 66.0 in | Wt 161.0 lb

## 2023-11-22 DIAGNOSIS — E1169 Type 2 diabetes mellitus with other specified complication: Secondary | ICD-10-CM | POA: Diagnosis not present

## 2023-11-22 DIAGNOSIS — M86141 Other acute osteomyelitis, right hand: Secondary | ICD-10-CM | POA: Diagnosis not present

## 2023-11-22 DIAGNOSIS — I10 Essential (primary) hypertension: Secondary | ICD-10-CM

## 2023-11-22 MED ORDER — INSULIN GLARGINE 100 UNIT/ML SOLOSTAR PEN: 30.0000 [IU] | PEN_INJECTOR | Freq: Every day | SUBCUTANEOUS | Status: AC

## 2023-11-22 NOTE — Assessment & Plan Note (Addendum)
 A1c >15.5.  Goal to optimize CBGs given necessary wound healing and high risk for worsening/recurrence of osteomyelitis. poorly controlled - Medications: Increase to Semglee  30 units daily - Appreciate point of care education on insulin  use and administration from Dr. Koval

## 2023-11-22 NOTE — Assessment & Plan Note (Addendum)
 BP: 136/78 today. Well controlled. Goal of <140/90. Continue to work on healthy dietary habits and exercise. - Medication regimen: Valsartan /hydrochlorothiazide  160-25 mg, amlodipine  10 mg - BMP today

## 2023-11-22 NOTE — Progress Notes (Signed)
 Asked by Dr. Toma to assist with insulin  injection education, and troubleshooting for patient recently hospitalized with finger infection and persistently high glucose values.  Patient educated on purpose, proper use and potential adverse effects of Lantus  (insulin  glargine).   Patient was able to demonstrate appropriate technique while administering first dose in office of 30 units.  Instructed to take next dose on AM of 11/23/2023 and once daily in the AM thereafter.  Following instruction patient verbalized understanding of treatment plan.

## 2023-11-22 NOTE — Progress Notes (Signed)
 SUBJECTIVE:   CHIEF COMPLAINT / HPI: Sally Mclaughlin is a 58 y.o. female with a pertinent past medical history of T2DM, HTN, pancreatitis, neuropathy, and hidradenitis resenting to the clinic for hospital follow up after hospitalization 6/26-7/3 for R index finger osteomyelitis.   Osteomyelitis of R index finger Right infected index finger, MRI confirmed findings of cellulitis and osteomyelitis. Hand surgery consulted in the ED and performed I&D with cultures.  No leukocytosis and patient remained afebrile throughout hospitalization.  Recommended hydrotherapy s/p I&D, which she tolerated well. Treated with Vancomycin  (6/27 - 6/30) and transitioned to Amoxicillin  500 TID (7/1) while inpatient due to culture growing GBS. Wound culture showed MSSA and candida species 7/1. ID consulted. Amoxicillin  switched to cefodroxil 1,000 mg BID (7/1-8/13) 6 week course and added fluconazole  400 mg (7/1-) with plan for 6 months to 1 year duration per ID.  Was also briefly on linezolid  600 mg Q12h (7/1-7/2), but discontinued. Discharged with follow-up with Infectious Disease.  Doing well since discharge, but does have questions about when to return to work.  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  Her hands get sweaty and wet in the gloves.  She has to take them off and put them on repeatedly and has to frequently wash her hands.  T2DM A1c>15.5.  Titrated insulin  up and CBGs improved to mid 100s.  She was discharged on 25 units Semglee  daily with insulin  kit/CBG monitoring.  Also started on atorvastatin  80 mg daily. Last A1c >15.5 on 6/27 Home CBGs: 150s-160s fasting since discharge Medications: Semglee  25 units daily Hypoglycemia plan: Check CBG immediately, drink juice Adherence: Has been struggling to use insulin  pen at home, needs help Eye exam: Due, deferred until next visit Foot exam: Due today Microalbumin: Due, patient could not urinate today, obtain next  visit Statin: Atorvastatin  80 mg daily No symptoms of hypoglycemia, polyuria, polydipsia, numbness of extremities, foot ulcers/trauma  HTN BP poorly controlled with SBPs in 180s while inpatient, not taking outpatient amlodipine  past few months. Started on irbesartan  300 mg, hydrochlorothiazide  25 mg, and and added amlodipine  10 mg daily, then transitioned to combo medicine at discharge with Valsartan /hydrochlorothiazide  160-25 mg and continued amlodipine . Has not been checking BP at home since discharge.   PERTINENT PMH / PSH: T2DM, HTN, pancreatitis, neuropathy, and hidradenitis   OBJECTIVE:   BP 136/78   Pulse 77   Ht 5' 6 (1.676 m)   Wt 161 lb (73 kg)   LMP 11/25/2012   SpO2 100%   BMI 25.99 kg/m   General: Age-appropriate, resting comfortably in chair, NAD, alert and at baseline. Cardiovascular: Regular rate and rhythm. Normal S1/S2. No murmurs, rubs, or gallops appreciated. 2+ radial pulses. Pulmonary: Clear bilaterally to ascultation. No wheezes, crackles, or rhonchi. Normal WOB on room air. No accessory muscle use. Abdominal: No tenderness to deep or light palpation. No rebound or guarding. No HSM. Extremities: Diabetic foot exam below.  R index finger with slowly healing wound and granulation tissue, no purulent drainage or surrounding edema (see photo below).  No peripheral edema bilaterally.    Diabetic Foot Exam - Simple   Simple Foot Form Visual Inspection No deformities, no ulcerations, no other skin breakdown bilaterally: Yes Sensation Testing Intact to touch and monofilament testing bilaterally: Yes Pulse Check Posterior Tibialis and Dorsalis pulse intact bilaterally: Yes Comments Diabetic foot exam with monofilament shows intact sensation and discrimination over all toes and plantar surfaces of feet bilaterally.  No ulcers or calluses present.  ASSESSMENT/PLAN:   Assessment & Plan Other acute osteomyelitis of right hand (HCC) R index finger,  healing.  Doing well on exam, improved from hospital stay. - Continue cefadroxil  1 g BID for 6 weeks and fluconazole  400 mg daily for 6 months-1 year per ID recommendations - ID follow up scheduled 8/13 - Per ID inpatient, they will recheck QTc on fluconazole  at follow ups as appropriate - CBC today Type 2 diabetes mellitus with other specified complication, without long-term current use of insulin  (HCC) A1c >15.5.  Goal to optimize CBGs given necessary wound healing and high risk for worsening/recurrence of osteomyelitis. poorly controlled - Medications: Increase to Semglee  30 units daily - Appreciate point of care education on insulin  use and administration from Dr. Koval Primary hypertension BP: 136/78 today. Well controlled. Goal of <140/90. Continue to work on healthy dietary habits and exercise. - Medication regimen: Valsartan /hydrochlorothiazide  160-25 mg, amlodipine  10 mg - BMP today  Return in about 8 weeks (around 01/17/2024) for Wound follow up.  Janette Harvie Toma, MD [facility]

## 2023-11-22 NOTE — Patient Instructions (Signed)
 It was great to see you today! Thank you for choosing [facility] for your primary care.  Today we addressed: Hand wound Please keep the wound dry and clean.  Wrap it gently to keep it clean during the day and dry it off after getting it wet in the shower or sink. Call Dr. Rojean office to schedule a follow up appointment: [phone].  You can go back to work after they have cleared you for duty.  Diabetes Please increase your insulin  to 30 units daily.  This will help control your blood sugars and help your wound heal.  Blood pressure Please keep taking your blood pressure medicines as prescribed.  We are checking some labs today, including metabolic panel and blood count.  You will get a MyChart message or a letter if results are normal. Otherwise, you will get a call from us .  You should return to our clinic in 8 weeks or sooner if your wound is getting more swollen, more red, more angry-looking, or if you are having fevers.  Thank you for coming to see us  at [facility] and for the opportunity to care for you! Toma, Ingri Diemer, MD 11/22/2023, 2:31 PM

## 2023-11-23 ENCOUNTER — Ambulatory Visit: Payer: Self-pay | Admitting: Family Medicine

## 2023-11-23 LAB — CBC
Hematocrit: 37.9 % (ref 34.0–46.6)
Hemoglobin: 12.4 g/dL (ref 11.1–15.9)
MCH: 30.2 pg (ref 26.6–33.0)
MCHC: 32.7 g/dL (ref 31.5–35.7)
MCV: 92 fL (ref 79–97)
Platelets: 312 x10E3/uL (ref 150–450)
RBC: 4.11 x10E6/uL (ref 3.77–5.28)
RDW: 12.6 % (ref 11.7–15.4)
WBC: 7.1 x10E3/uL (ref 3.4–10.8)

## 2023-11-23 LAB — BASIC METABOLIC PANEL WITH GFR
BUN/Creatinine Ratio: 24 (ref 12–28)
BUN: 28 mg/dL — ABNORMAL HIGH (ref 8–27)
CO2: 17 mmol/L — ABNORMAL LOW (ref 20–29)
Calcium: 9.4 mg/dL (ref 8.7–10.3)
Chloride: 100 mmol/L (ref 96–106)
Creatinine, Ser: 1.15 mg/dL — ABNORMAL HIGH (ref 0.57–1.00)
Glucose: 289 mg/dL — ABNORMAL HIGH (ref 70–99)
Potassium: 4.8 mmol/L (ref 3.5–5.2)
Sodium: 134 mmol/L (ref 134–144)
eGFR: 55 mL/min/1.73 — ABNORMAL LOW (ref >59)

## 2023-12-01 ENCOUNTER — Other Ambulatory Visit (HOSPITAL_COMMUNITY): Payer: Self-pay

## 2023-12-17 ENCOUNTER — Other Ambulatory Visit: Payer: Self-pay

## 2023-12-17 ENCOUNTER — Other Ambulatory Visit: Payer: Self-pay | Admitting: Family Medicine

## 2023-12-17 ENCOUNTER — Other Ambulatory Visit (HOSPITAL_COMMUNITY): Payer: Self-pay

## 2023-12-17 MED ORDER — ATORVASTATIN CALCIUM 80 MG PO TABS
80.0000 mg | ORAL_TABLET | Freq: Every day | ORAL | 0 refills | Status: AC
  Filled 2023-12-17: qty 30, 30d supply, fill #0

## 2023-12-17 MED ORDER — AMLODIPINE BESYLATE 10 MG PO TABS
10.0000 mg | ORAL_TABLET | Freq: Every morning | ORAL | 0 refills | Status: DC
  Filled 2023-12-17: qty 30, 30d supply, fill #0

## 2023-12-17 MED ORDER — VALSARTAN-HYDROCHLOROTHIAZIDE 160-25 MG PO TABS
1.0000 | ORAL_TABLET | Freq: Every day | ORAL | 0 refills | Status: DC
  Filled 2023-12-17: qty 30, 30d supply, fill #0

## 2023-12-17 MED ORDER — VITAMIN C 500 MG PO TABS
1000.0000 mg | ORAL_TABLET | Freq: Every day | ORAL | 0 refills | Status: AC
  Filled 2023-12-17: qty 100, 50d supply, fill #0

## 2023-12-17 MED ORDER — LANTUS SOLOSTAR 100 UNIT/ML ~~LOC~~ SOPN
25.0000 [IU] | PEN_INJECTOR | Freq: Every day | SUBCUTANEOUS | 0 refills | Status: DC
  Filled 2023-12-17: qty 15, 60d supply, fill #0

## 2023-12-17 MED ORDER — CEFADROXIL 500 MG PO CAPS
1000.0000 mg | ORAL_CAPSULE | Freq: Two times a day (BID) | ORAL | 0 refills | Status: AC
  Filled 2023-12-17 – 2023-12-22 (×2): qty 168, 42d supply, fill #0

## 2023-12-22 ENCOUNTER — Other Ambulatory Visit (HOSPITAL_COMMUNITY): Payer: Self-pay

## 2023-12-23 ENCOUNTER — Other Ambulatory Visit (HOSPITAL_COMMUNITY): Payer: Self-pay

## 2023-12-24 ENCOUNTER — Other Ambulatory Visit (HOSPITAL_COMMUNITY): Payer: Self-pay

## 2023-12-28 NOTE — Progress Notes (Signed)
 Subjective:  Chief Complaint: follow-up for polymicrobial osteomyelitis of finger   Patient ID: Sally Mclaughlin, female    DOB: [birth year], 58 y.o.   MRN: [id number]  HPI  Discussed the use of AI scribe software for clinical note transcription with the patient, who gave verbal consent to proceed.  History of Present Illness   Sally Mclaughlin is a 42  -year-old lady with diabetes mellitus hypertension who developed swelling in her finger that worsened and caused her to seek care 26 June in urgent care.  X-ray of the fingers did not show any pathology but MRI showed enhancement the distal phalanx index finger concerning for osteomyelitis.  She was brought to the operating room 27 June and underwent irrigation and debridement of the right index finger including bone cortex while there is no gross purulence encountered cultures were obtained and they have yielded group B streptococcus Candida albicans Staphylococcus aureus as well as Staphylococcus warner  She is under the care of a hand surgeon and has a follow-up appointment scheduled in two weeks. She has been on cefadroxil , 1000 mg twice a day with intention of her completing 6 weeks but she was given another 6 weeks of this by her PCP it appears   She is also on  fluconazole , 200 mg once a day, expected to take for six months.  She experiences intermittent chills for the past few months and neuropathy or numbness in her hands. During the initial infection, she experienced pain in her finger, but it was not severe even when hospitalized.  She is concerned about the appearance of her fingernail, suspecting it may be infected, and wonders if it needs further treatment.  She is eager to return to work and is dealing with pressure from her insurance company regarding her return, awaiting clearance from her Careers adviser.       Past Medical History:  Diagnosis Date   Diabetes mellitus without complication (HCC)    Hypercholesteremia     Hypertension    Suprapubic abscess 09/06/2019    Past Surgical History:  Procedure Laterality Date   HERNIA REPAIR     INCISION AND DRAINAGE OF DEEP ABSCESS, HAND Right 11/12/2023   Procedure: INCISION AND DRAINAGE OF DEEP ABSCESS, HAND;  Surgeon: Murrell Drivers, MD;  Location: [facility] OR;  Service: Orthopedics;  Laterality: Right;  Incision & Drainage right index finger    Family History  Problem Relation Age of Onset   Ovarian cancer Sister    Colon cancer Neg Hx    Colon polyps Neg Hx    Esophageal cancer Neg Hx    Stomach cancer Neg Hx    Rectal cancer Neg Hx       Social History   Socioeconomic History   Marital status: Married    Spouse name: Not on file   Number of children: Not on file   Years of education: Not on file   Highest education level: Not on file  Occupational History   Not on file  Tobacco Use   Smoking status: Light Smoker    Types: Cigarettes   Smokeless tobacco: Never  Vaping Use   Vaping status: Never Used  Substance and Sexual Activity   Alcohol use: Yes    Comment: social   Drug use: No   Sexual activity: Yes  Other Topics Concern   Not on file  Social History Narrative   Not on file   Social Drivers of Health   Financial Resource Strain: Not on file  Food Insecurity: Low Risk  (11/24/2023)   Received from [facility]   Hunger Vital Sign    Within the past 12 months, you worried that your food would run out before you got money to buy more: Never true    Within the past 12 months, the food you bought just didn't last and you didn't have money to get more. : Never true  Recent Concern: Food Insecurity - Food Insecurity Present (11/13/2023)   Hunger Vital Sign    Worried About Running Out of Food in the Last Year: Often true    Ran Out of Food in the Last Year: Often true  Transportation Needs: No Transportation Needs (11/24/2023)   Received from [facility]    In the past 12 months, has lack of reliable transportation kept  you from medical appointments, meetings, work or from getting things needed for daily living? : No  Physical Activity: Not on file  Stress: Not on file  Social Connections: Not on file    No Known Allergies   Current Outpatient Medications:    acetaminophen  (TYLENOL ) 500 MG tablet, Take 1,000 mg by mouth every 6 (six) hours as needed for mild pain (pain score 1-3) or headache., Disp: , Rfl:    amLODipine  (NORVASC ) 10 MG tablet, Take 1 tablet (10 mg total) by mouth every morning., Disp: 30 tablet, Rfl: 0   ascorbic acid  (VITAMIN C ) 500 MG tablet, Take 2 tablets (1,000 mg total) by mouth daily., Disp: 100 tablet, Rfl: 0   atorvastatin  (LIPITOR ) 80 MG tablet, Take 1 tablet (80 mg total) by mouth daily., Disp: 30 tablet, Rfl: 0   Blood Glucose Monitoring Suppl (BLOOD GLUCOSE MONITOR SYSTEM) w/Device KIT, Use 3 (three) times daily., Disp: 1 kit, Rfl: 0   cefadroxil  (DURICEF) 500 MG capsule, Take 2 capsules (1,000 mg total) by mouth 2 (two) times daily., Disp: 168 capsule, Rfl: 0   fluconazole  (DIFLUCAN ) 200 MG tablet, Take 2 tablets (400 mg total) by mouth daily., Disp: 84 tablet, Rfl: 10   Glucose Blood (BLOOD GLUCOSE TEST STRIPS) STRP, Use as directed to check blood sugar 3 (three) times daily., Disp: 100 strip, Rfl: 0   insulin  glargine (LANTUS  SOLOSTAR) 100 UNIT/ML Solostar Pen, Inject 25 Units into the skin daily., Disp: 15 mL, Rfl: 0   insulin  glargine (LANTUS ) 100 UNIT/ML Solostar Pen, Inject 30 Units into the skin daily., Disp: , Rfl:    Insulin  Pen Needle 32G X 4 MM MISC, Use 1 each 3 (three) times daily., Disp: 100 each, Rfl: 0   Lancet Device MISC, Use 3 (three) times daily., Disp: 1 each, Rfl: 0   Lancets 30G MISC, Use 1 each 3 (three) times daily., Disp: 100 each, Rfl: 0   valsartan -hydrochlorothiazide  (DIOVAN -HCT) 160-25 MG tablet, Take 1 tablet by mouth daily., Disp: 30 tablet, Rfl: 0   Review of Systems  Constitutional:  Positive for chills. Negative for activity change,  appetite change, diaphoresis, fatigue, fever and unexpected weight change.  HENT:  Negative for congestion, rhinorrhea, sinus pressure, sneezing, sore throat and trouble swallowing.   Eyes:  Negative for photophobia and visual disturbance.  Respiratory:  Negative for cough, chest tightness, shortness of breath, wheezing and stridor.   Cardiovascular:  Negative for chest pain, palpitations and leg swelling.  Gastrointestinal:  Negative for abdominal distention, abdominal pain, anal bleeding, blood in stool, constipation, diarrhea, nausea and vomiting.  Genitourinary:  Negative for difficulty urinating, dysuria, flank pain and hematuria.  Musculoskeletal:  Positive  for arthralgias and joint swelling. Negative for back pain, gait problem and myalgias.  Skin:  Negative for color change, pallor, rash and wound.  Neurological:  Negative for dizziness, tremors, weakness and light-headedness.  Hematological:  Negative for adenopathy. Does not bruise/bleed easily.  Psychiatric/Behavioral:  Negative for agitation, behavioral problems, confusion, decreased concentration, dysphoric mood and sleep disturbance.        Objective:   Physical Exam Constitutional:      General: She is not in acute distress.    Appearance: Normal appearance. She is well-developed. She is not ill-appearing or diaphoretic.  HENT:     Head: Normocephalic and atraumatic.     Right Ear: Hearing and external ear normal.     Left Ear: Hearing and external ear normal.     Nose: No nasal deformity or rhinorrhea.  Eyes:     General: No scleral icterus.    Conjunctiva/sclera: Conjunctivae normal.     Right eye: Right conjunctiva is not injected.     Left eye: Left conjunctiva is not injected.     Pupils: Pupils are equal, round, and reactive to light.  Neck:     Vascular: No JVD.  Cardiovascular:     Rate and Rhythm: Normal rate and regular rhythm.     Heart sounds: S1 normal and S2 normal.  Pulmonary:     Effort: Pulmonary  effort is normal. No respiratory distress.     Breath sounds: No wheezing.  Abdominal:     General: There is no distension.     Palpations: Abdomen is soft.  Musculoskeletal:        General: Normal range of motion.     Right shoulder: Normal.     Left shoulder: Normal.     Cervical back: Normal range of motion and neck supple.     Right hip: Normal.     Left hip: Normal.     Right knee: Normal.     Left knee: Normal.  Lymphadenopathy:     Head:     Right side of head: No submandibular, preauricular or posterior auricular adenopathy.     Left side of head: No submandibular, preauricular or posterior auricular adenopathy.     Cervical: No cervical adenopathy.     Right cervical: No superficial or deep cervical adenopathy.    Left cervical: No superficial or deep cervical adenopathy.  Skin:    General: Skin is warm and dry.     Coloration: Skin is not pale.     Findings: No abrasion, bruising, ecchymosis, erythema, lesion or rash.     Nails: There is no clubbing.  Neurological:     Mental Status: She is alert and oriented to person, place, and time.     Sensory: No sensory deficit.     Coordination: Coordination normal.     Gait: Gait normal.  Psychiatric:        Attention and Perception: She is attentive.        Mood and Affect: Mood normal.        Speech: Speech normal.        Behavior: Behavior normal. Behavior is cooperative.        Thought Content: Thought content normal.        Judgment: Judgment normal.    Finger 12/29/2023:         Assessment & Plan:   Assessment and Plan    Post-surgical finger osteomyelitis Nearing completion of cefadroxil  course.  - Complete cefadroxil  course. - Continue fluconazole   for six months total with follow-up - Order labs for inflammatory markers. - follwoup with  Dr. Murrell   Recurrent chills, etiology unclear Recurrent chills for months, etiology uncertain, does not make sense for this to be due to her finger  infection  Peripheral neuropathy Peripheral neuropathy with hand numbness, may affect pain perception during infection.   Onycomycosis of finger nail: perhaps Dr. Murrell could help her with taking off unhealthy nail. Lamisil systemic could be an option if not an issue to be takign with fluconazole 

## 2023-12-29 ENCOUNTER — Other Ambulatory Visit (HOSPITAL_COMMUNITY): Payer: Self-pay

## 2023-12-29 ENCOUNTER — Other Ambulatory Visit: Payer: Self-pay

## 2023-12-29 ENCOUNTER — Encounter: Payer: Self-pay | Admitting: Infectious Disease

## 2023-12-29 ENCOUNTER — Ambulatory Visit: Payer: Self-pay | Admitting: Infectious Disease

## 2023-12-29 VITALS — BP 134/75 | HR 79 | Temp 98.1°F | Ht 66.0 in | Wt 160.0 lb

## 2023-12-29 DIAGNOSIS — G629 Polyneuropathy, unspecified: Secondary | ICD-10-CM | POA: Diagnosis not present

## 2023-12-29 DIAGNOSIS — E1140 Type 2 diabetes mellitus with diabetic neuropathy, unspecified: Secondary | ICD-10-CM

## 2023-12-29 DIAGNOSIS — A499 Bacterial infection, unspecified: Secondary | ICD-10-CM | POA: Diagnosis not present

## 2023-12-29 DIAGNOSIS — M869 Osteomyelitis, unspecified: Secondary | ICD-10-CM | POA: Diagnosis not present

## 2023-12-29 DIAGNOSIS — M86141 Other acute osteomyelitis, right hand: Secondary | ICD-10-CM

## 2023-12-29 DIAGNOSIS — R6883 Chills (without fever): Secondary | ICD-10-CM | POA: Diagnosis not present

## 2023-12-29 DIAGNOSIS — B351 Tinea unguium: Secondary | ICD-10-CM | POA: Diagnosis not present

## 2023-12-29 DIAGNOSIS — E1149 Type 2 diabetes mellitus with other diabetic neurological complication: Secondary | ICD-10-CM

## 2023-12-29 MED ORDER — FLUCONAZOLE 200 MG PO TABS
400.0000 mg | ORAL_TABLET | Freq: Every day | ORAL | 5 refills | Status: AC
  Filled 2023-12-29 (×2): qty 60, 30d supply, fill #0

## 2023-12-30 LAB — CBC WITH DIFFERENTIAL/PLATELET
Absolute Lymphocytes: 2310 {cells}/uL (ref 850–3900)
Absolute Monocytes: 287 {cells}/uL (ref 200–950)
Basophils Absolute: 28 {cells}/uL (ref 0–200)
Basophils Relative: 0.4 %
Eosinophils Absolute: 119 {cells}/uL (ref 15–500)
Eosinophils Relative: 1.7 %
HCT: 34.8 % — ABNORMAL LOW (ref 35.0–45.0)
Hemoglobin: 11.8 g/dL (ref 11.7–15.5)
MCH: 30.1 pg (ref 27.0–33.0)
MCHC: 33.9 g/dL (ref 32.0–36.0)
MCV: 88.8 fL (ref 80.0–100.0)
MPV: 9.9 fL (ref 7.5–12.5)
Monocytes Relative: 4.1 %
Neutro Abs: 4256 {cells}/uL (ref 1500–7800)
Neutrophils Relative %: 60.8 %
Platelets: 282 Thousand/uL (ref 140–400)
RBC: 3.92 Million/uL (ref 3.80–5.10)
RDW: 13.3 % (ref 11.0–15.0)
Total Lymphocyte: 33 %
WBC: 7 Thousand/uL (ref 3.8–10.8)

## 2023-12-30 LAB — BASIC METABOLIC PANEL WITHOUT GFR
BUN/Creatinine Ratio: 19 (calc) (ref 6–22)
BUN: 29 mg/dL — ABNORMAL HIGH (ref 7–25)
CO2: 25 mmol/L (ref 20–32)
Calcium: 9.7 mg/dL (ref 8.6–10.4)
Chloride: 103 mmol/L (ref 98–110)
Creat: 1.56 mg/dL — ABNORMAL HIGH (ref 0.50–1.05)
Glucose, Bld: 196 mg/dL — ABNORMAL HIGH (ref 65–99)
Potassium: 4.3 mmol/L (ref 3.5–5.3)
Sodium: 138 mmol/L (ref 135–146)

## 2023-12-30 LAB — C-REACTIVE PROTEIN: CRP: 6.9 mg/L (ref <8.0)

## 2023-12-30 LAB — SEDIMENTATION RATE: Sed Rate: 56 mm/h — ABNORMAL HIGH (ref 0–30)

## 2024-01-05 ENCOUNTER — Other Ambulatory Visit (HOSPITAL_COMMUNITY): Payer: Self-pay

## 2024-01-18 DIAGNOSIS — M86241 Subacute osteomyelitis, right hand: Secondary | ICD-10-CM | POA: Diagnosis not present

## 2024-01-18 DIAGNOSIS — B351 Tinea unguium: Secondary | ICD-10-CM | POA: Diagnosis not present

## 2024-02-03 ENCOUNTER — Other Ambulatory Visit (HOSPITAL_COMMUNITY): Payer: Self-pay

## 2024-02-03 ENCOUNTER — Encounter: Payer: Self-pay | Admitting: Family Medicine

## 2024-02-03 ENCOUNTER — Other Ambulatory Visit: Payer: Self-pay

## 2024-02-03 ENCOUNTER — Ambulatory Visit (INDEPENDENT_AMBULATORY_CARE_PROVIDER_SITE_OTHER): Admitting: Family Medicine

## 2024-02-03 VITALS — BP 164/72 | HR 84 | Ht 66.0 in

## 2024-02-03 DIAGNOSIS — I10 Essential (primary) hypertension: Secondary | ICD-10-CM

## 2024-02-03 DIAGNOSIS — Z23 Encounter for immunization: Secondary | ICD-10-CM

## 2024-02-03 DIAGNOSIS — E1140 Type 2 diabetes mellitus with diabetic neuropathy, unspecified: Secondary | ICD-10-CM

## 2024-02-03 LAB — POCT GLYCOSYLATED HEMOGLOBIN (HGB A1C): HbA1c, POC (controlled diabetic range): 9.8 % — AB (ref 0.0–7.0)

## 2024-02-03 MED ORDER — AMLODIPINE BESYLATE 10 MG PO TABS
10.0000 mg | ORAL_TABLET | Freq: Every morning | ORAL | 3 refills | Status: AC
Start: 2024-02-03 — End: ?
  Filled 2024-02-03: qty 90, 90d supply, fill #0

## 2024-02-03 MED ORDER — VALSARTAN-HYDROCHLOROTHIAZIDE 160-25 MG PO TABS
1.0000 | ORAL_TABLET | Freq: Every day | ORAL | 3 refills | Status: AC
  Filled 2024-02-03: qty 90, 90d supply, fill #0

## 2024-02-03 MED ORDER — FREESTYLE LIBRE 3 PLUS SENSOR MISC
24 refills | Status: AC
  Filled 2024-02-03: qty 1, 15d supply, fill #0

## 2024-02-03 NOTE — Addendum Note (Signed)
 Addended by: Luevenia Mcavoy on: 02/03/2024 10:49 AM   Modules accepted: Orders

## 2024-02-03 NOTE — Assessment & Plan Note (Signed)
-   A1c improved today from 15.5 to 9.8 - Will initiate CGM to simplify blood sugar monitoring for patient -Follow-up in 4 weeks for further medication adjustment.

## 2024-02-03 NOTE — Assessment & Plan Note (Signed)
-   Blood pressure today not at goal due to patient being out of her medications -Refill amlodipine  10 mg -Refilled Diovan -HCTZ -Will check BMP for kidney function monitoring today

## 2024-02-03 NOTE — Patient Instructions (Signed)
 It was wonderful to see you today!  Your A1c today was 9.8. This means your sugars are improving. Please continue to check your blood sugars daily, and take all of your medications, even when you feel well.   We made the following adjustments to your medication regimen:  Started a continuous glucose monitor. You have an appointment with Dr. Koval to get that set up.   We checked the following labs today:  -A1c -urine microalbumin -bmp  If any of your labs are abnormal you will receive a phone call from me to discuss follow up, otherwise your results will be available in MyChart within the next few days.   Please reach out to your pharmacy for any medication refills.   Be Well, Dr. Lucie Pinal, DO

## 2024-02-03 NOTE — Progress Notes (Signed)
    SUBJECTIVE:   CHIEF COMPLAINT / HPI:   Patient presents for medication refills, routine lab monitoring of chronic conditions.   Patient struggles to maintain regular schedule for medications and for blood glucose monitoring.  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  She has been out of her blood pressure medications for 2 weeks because of this.  PERTINENT  PMH / PSH: Hypertension, diabetes, hyperlipidemia  OBJECTIVE:   Ht 5' 6 (1.676 m)   LMP 11/25/2012   BMI 25.82 kg/m   General: A&O, NAD Cardiac: RRR, no m/r/g Respiratory: CTAB, normal WOB, no w/c/r GI: Soft, NTTP, non-distended  Extremities: NTTP, no peripheral edema.  ASSESSMENT/PLAN:   Assessment & Plan Primary hypertension - Blood pressure today not at goal due to patient being out of her medications -Refill amlodipine  10 mg -Refilled Diovan -HCTZ -Will check BMP for kidney function monitoring today Type 2 diabetes mellitus with diabetic neuropathy, without long-term current use of insulin  (HCC) - A1c improved today from 15.5 to 9.8 - Will initiate CGM to simplify blood sugar monitoring for patient -Follow-up in 4 weeks for further medication adjustment.   Sally Pinal, DO [facility]

## 2024-02-04 ENCOUNTER — Ambulatory Visit (HOSPITAL_COMMUNITY): Payer: Self-pay | Admitting: Family Medicine

## 2024-02-04 LAB — MICROALBUMIN / CREATININE URINE RATIO
Creatinine, Urine: 134.5 mg/dL
Microalb/Creat Ratio: 661 mg/g{creat} — ABNORMAL HIGH (ref 0–29)
Microalbumin, Urine: 888.7 ug/mL

## 2024-02-04 LAB — BASIC METABOLIC PANEL WITH GFR
BUN/Creatinine Ratio: 30 — ABNORMAL HIGH (ref 12–28)
BUN: 35 mg/dL — ABNORMAL HIGH (ref 8–27)
CO2: 20 mmol/L (ref 20–29)
Calcium: 9.3 mg/dL (ref 8.7–10.3)
Chloride: 99 mmol/L (ref 96–106)
Creatinine, Ser: 1.15 mg/dL — ABNORMAL HIGH (ref 0.57–1.00)
Glucose: 367 mg/dL — ABNORMAL HIGH (ref 70–99)
Potassium: 4.9 mmol/L (ref 3.5–5.2)
Sodium: 135 mmol/L (ref 134–144)
eGFR: 55 mL/min/1.73 — ABNORMAL LOW (ref >59)

## 2024-02-14 ENCOUNTER — Ambulatory Visit: Admitting: Pharmacist

## 2024-02-14 ENCOUNTER — Other Ambulatory Visit (HOSPITAL_COMMUNITY): Payer: Self-pay

## 2024-02-18 DIAGNOSIS — M86241 Subacute osteomyelitis, right hand: Secondary | ICD-10-CM | POA: Diagnosis not present

## 2024-02-27 ENCOUNTER — Encounter: Payer: Self-pay | Admitting: Infectious Disease

## 2024-02-27 DIAGNOSIS — B379 Candidiasis, unspecified: Secondary | ICD-10-CM | POA: Insufficient documentation

## 2024-02-27 NOTE — Progress Notes (Unsigned)
 Subjective:  Chief Complaint: follow-up for osteomyelitis of the finger   Patient ID: Sally Mclaughlin, female    DOB: 1965-09-18, 58 y.o.   MRN: [id number]  HPI  Discussed the use of AI scribe software for clinical note transcription with the patient, who gave verbal consent to proceed.  History of Present Illness   Sally Mclaughlin is a 58 year old female who presents for follow-up of a finger infection (osteomyelitis).  She is being followed for a chronic finger infection and is currently on fluconazole  for antifungal treatment. She has an ample supply of medication, with two to three large bottles remaining, and has not needed a refill since August.  The pain in her finger is currently manageable. She is managing her condition alone and is worried about the implications of being out of work. She had plain films at orthopedists showing lucency at distal phalanx not seen several months ago on plain films when MRI diagnosed her osteomyelitis.     Xray from Dr. Rojean office last visit    Review of Systems  Constitutional:  Negative for activity change, appetite change, chills, diaphoresis, fatigue, fever and unexpected weight change.  HENT:  Negative for congestion, rhinorrhea, sinus pressure, sneezing, sore throat and trouble swallowing.   Eyes:  Negative for photophobia and visual disturbance.  Respiratory:  Negative for cough, chest tightness, shortness of breath, wheezing and stridor.   Cardiovascular:  Negative for chest pain, palpitations and leg swelling.  Gastrointestinal:  Negative for abdominal distention, abdominal pain, anal bleeding, blood in stool, constipation, diarrhea, nausea and vomiting.  Genitourinary:  Negative for difficulty urinating, dysuria, flank pain and hematuria.  Musculoskeletal:  Negative for arthralgias, back pain, gait problem, joint swelling and myalgias.  Skin:  Negative for color change, pallor, rash and wound.  Neurological:  Negative for  dizziness, tremors, weakness and light-headedness.  Hematological:  Negative for adenopathy. Does not bruise/bleed easily.  Psychiatric/Behavioral:  Negative for agitation, behavioral problems, confusion, decreased concentration, dysphoric mood and sleep disturbance.        Objective:   Physical Exam Constitutional:      General: She is not in acute distress.    Appearance: Normal appearance. She is well-developed. She is not ill-appearing or diaphoretic.  HENT:     Head: Normocephalic and atraumatic.     Right Ear: Hearing and external ear normal.     Left Ear: Hearing and external ear normal.     Nose: No nasal deformity or rhinorrhea.  Eyes:     General: No scleral icterus.    Conjunctiva/sclera: Conjunctivae normal.     Right eye: Right conjunctiva is not injected.     Left eye: Left conjunctiva is not injected.     Pupils: Pupils are equal, round, and reactive to light.  Neck:     Vascular: No JVD.  Cardiovascular:     Rate and Rhythm: Normal rate and regular rhythm.     Heart sounds: S1 normal and S2 normal. No murmur heard. Pulmonary:     Effort: No respiratory distress.     Breath sounds: No wheezing.  Abdominal:     General: Bowel sounds are normal. There is no distension.     Palpations: Abdomen is soft.  Musculoskeletal:     Right shoulder: Normal.     Left shoulder: Normal.     Cervical back: Normal range of motion and neck supple.     Right hip: Normal.     Left hip: Normal.  Right knee: Normal.     Left knee: Normal.  Lymphadenopathy:     Head:     Right side of head: No submandibular, preauricular or posterior auricular adenopathy.     Left side of head: No submandibular, preauricular or posterior auricular adenopathy.     Cervical: No cervical adenopathy.     Right cervical: No superficial or deep cervical adenopathy.    Left cervical: No superficial or deep cervical adenopathy.  Skin:    General: Skin is warm and dry.     Coloration: Skin is not  pale.     Findings: No abrasion, bruising, ecchymosis, erythema, lesion or rash.     Nails: There is no clubbing.  Neurological:     General: No focal deficit present.     Mental Status: She is alert and oriented to person, place, and time.     Sensory: No sensory deficit.     Coordination: Coordination normal.     Gait: Gait normal.  Psychiatric:        Attention and Perception: She is attentive.        Mood and Affect: Mood normal.        Speech: Speech normal.        Behavior: Behavior normal. Behavior is cooperative.        Thought Content: Thought content normal.        Judgment: Judgment normal.     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 She completed antibacterial antibiotoics. Currently on  fluconazole . Pain controlled. External healing progressing. Patient prefers to avoid surgery unless necessary. - Continue fluconazole . - Order blood work for inflammatory markers, ESR, CRP CBC BMP - Follow up with Dr. Murrell on November 10th. - Ensure Dr. Murrell communicates any treatment changes, especially regarding surgery. - Schedule December follow-up with us \   Vaccine counseling: Gave COVID shot

## 2024-02-28 ENCOUNTER — Ambulatory Visit (INDEPENDENT_AMBULATORY_CARE_PROVIDER_SITE_OTHER): Payer: Self-pay | Admitting: Infectious Disease

## 2024-02-28 ENCOUNTER — Other Ambulatory Visit: Payer: Self-pay

## 2024-02-28 VITALS — BP 199/78 | HR 83 | Temp 97.7°F | Wt 167.0 lb

## 2024-02-28 DIAGNOSIS — M869 Osteomyelitis, unspecified: Secondary | ICD-10-CM | POA: Diagnosis not present

## 2024-02-28 DIAGNOSIS — A499 Bacterial infection, unspecified: Secondary | ICD-10-CM | POA: Diagnosis not present

## 2024-02-28 DIAGNOSIS — M86649 Other chronic osteomyelitis, unspecified hand: Secondary | ICD-10-CM

## 2024-02-28 DIAGNOSIS — B379 Candidiasis, unspecified: Secondary | ICD-10-CM

## 2024-02-28 DIAGNOSIS — L089 Local infection of the skin and subcutaneous tissue, unspecified: Secondary | ICD-10-CM | POA: Diagnosis not present

## 2024-02-28 DIAGNOSIS — Z23 Encounter for immunization: Secondary | ICD-10-CM | POA: Diagnosis not present

## 2024-02-29 LAB — C-REACTIVE PROTEIN: CRP: 6.6 mg/L (ref <8.0)

## 2024-02-29 LAB — BASIC METABOLIC PANEL WITHOUT GFR
BUN/Creatinine Ratio: 21 (calc) (ref 6–22)
BUN: 24 mg/dL (ref 7–25)
CO2: 25 mmol/L (ref 20–32)
Calcium: 9.3 mg/dL (ref 8.6–10.4)
Chloride: 105 mmol/L (ref 98–110)
Creat: 1.15 mg/dL — ABNORMAL HIGH (ref 0.50–1.05)
Glucose, Bld: 247 mg/dL — ABNORMAL HIGH (ref 65–99)
Potassium: 4.4 mmol/L (ref 3.5–5.3)
Sodium: 136 mmol/L (ref 135–146)

## 2024-02-29 LAB — SEDIMENTATION RATE: Sed Rate: 53 mm/h — ABNORMAL HIGH (ref 0–30)

## 2024-02-29 LAB — CBC WITH DIFFERENTIAL/PLATELET
Absolute Lymphocytes: 1682 {cells}/uL (ref 850–3900)
Absolute Monocytes: 249 {cells}/uL (ref 200–950)
Basophils Absolute: 29 {cells}/uL (ref 0–200)
Basophils Relative: 0.5 %
Eosinophils Absolute: 110 {cells}/uL (ref 15–500)
Eosinophils Relative: 1.9 %
HCT: 31.7 % — ABNORMAL LOW (ref 35.0–45.0)
Hemoglobin: 10.5 g/dL — ABNORMAL LOW (ref 11.7–15.5)
MCH: 30.1 pg (ref 27.0–33.0)
MCHC: 33.1 g/dL (ref 32.0–36.0)
MCV: 90.8 fL (ref 80.0–100.0)
MPV: 10.1 fL (ref 7.5–12.5)
Monocytes Relative: 4.3 %
Neutro Abs: 3729 {cells}/uL (ref 1500–7800)
Neutrophils Relative %: 64.3 %
Platelets: 263 Thousand/uL (ref 140–400)
RBC: 3.49 Million/uL — ABNORMAL LOW (ref 3.80–5.10)
RDW: 12.6 % (ref 11.0–15.0)
Total Lymphocyte: 29 %
WBC: 5.8 Thousand/uL (ref 3.8–10.8)

## 2024-03-02 ENCOUNTER — Encounter: Payer: Self-pay | Admitting: Pharmacist

## 2024-03-06 ENCOUNTER — Ambulatory Visit

## 2024-03-06 ENCOUNTER — Ambulatory Visit: Payer: Self-pay | Admitting: Family Medicine

## 2024-05-09 ENCOUNTER — Ambulatory Visit: Payer: Self-pay | Admitting: Infectious Disease
